# Patient Record
Sex: Female | Born: 1965 | Race: White | Hispanic: No | Marital: Married | Smoking: Never smoker
Health system: Southern US, Community
[De-identification: ages and names within clinical notes are randomized; demographics above are authoritative.]

## PROBLEM LIST (undated history)

## (undated) DIAGNOSIS — Z87442 Personal history of urinary calculi: Secondary | ICD-10-CM

## (undated) DIAGNOSIS — R51 Headache: Secondary | ICD-10-CM

## (undated) DIAGNOSIS — I10 Essential (primary) hypertension: Secondary | ICD-10-CM

## (undated) DIAGNOSIS — F419 Anxiety disorder, unspecified: Secondary | ICD-10-CM

## (undated) DIAGNOSIS — M797 Fibromyalgia: Secondary | ICD-10-CM

## (undated) DIAGNOSIS — R1011 Right upper quadrant pain: Secondary | ICD-10-CM

## (undated) DIAGNOSIS — F329 Major depressive disorder, single episode, unspecified: Secondary | ICD-10-CM

## (undated) DIAGNOSIS — N926 Irregular menstruation, unspecified: Secondary | ICD-10-CM

## (undated) DIAGNOSIS — E079 Disorder of thyroid, unspecified: Secondary | ICD-10-CM

## (undated) HISTORY — DX: Headache: R51

## (undated) HISTORY — DX: Major depressive disorder, single episode, unspecified: F32.9

## (undated) HISTORY — DX: Irregular menstruation, unspecified: N92.6

## (undated) HISTORY — DX: Fibromyalgia: M79.7

## (undated) HISTORY — DX: Personal history of urinary calculi: Z87.442

## (undated) HISTORY — DX: Right upper quadrant pain: R10.11

## (undated) HISTORY — DX: Anxiety disorder, unspecified: F41.9

## (undated) HISTORY — DX: Essential (primary) hypertension: I10

## (undated) HISTORY — DX: Disorder of thyroid, unspecified: E07.9

---
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
## 1997-09-10 ENCOUNTER — Ambulatory Visit: Admission: RE | Admit: 1997-09-10 | Discharge: 1997-09-10 | Payer: Self-pay | Admitting: Internal Medicine

## 2000-08-18 ENCOUNTER — Emergency Department (HOSPITAL_COMMUNITY): Admission: EM | Admit: 2000-08-18 | Discharge: 2000-08-19 | Payer: Self-pay | Admitting: Emergency Medicine

## 2000-08-18 ENCOUNTER — Encounter: Payer: Self-pay | Admitting: Emergency Medicine

## 2002-03-17 ENCOUNTER — Encounter: Admission: RE | Admit: 2002-03-17 | Discharge: 2002-03-17 | Payer: Self-pay | Admitting: Family Medicine

## 2002-03-17 ENCOUNTER — Encounter: Payer: Self-pay | Admitting: Family Medicine

## 2006-01-01 LAB — CONVERTED CEMR LAB: Pap Smear: ABNORMAL

## 2009-06-20 ENCOUNTER — Encounter: Admission: RE | Admit: 2009-06-20 | Discharge: 2009-06-20 | Payer: Self-pay | Admitting: Family Medicine

## 2009-09-28 ENCOUNTER — Ambulatory Visit: Payer: Self-pay | Admitting: Family Medicine

## 2009-09-28 DIAGNOSIS — N926 Irregular menstruation, unspecified: Secondary | ICD-10-CM | POA: Insufficient documentation

## 2009-09-28 DIAGNOSIS — R519 Headache, unspecified: Secondary | ICD-10-CM

## 2009-09-28 DIAGNOSIS — F3289 Other specified depressive episodes: Secondary | ICD-10-CM

## 2009-09-28 DIAGNOSIS — R51 Headache: Secondary | ICD-10-CM

## 2009-09-28 DIAGNOSIS — G8929 Other chronic pain: Secondary | ICD-10-CM

## 2009-09-28 DIAGNOSIS — I10 Essential (primary) hypertension: Secondary | ICD-10-CM

## 2009-09-28 DIAGNOSIS — Z8659 Personal history of other mental and behavioral disorders: Secondary | ICD-10-CM | POA: Insufficient documentation

## 2009-09-28 DIAGNOSIS — F329 Major depressive disorder, single episode, unspecified: Secondary | ICD-10-CM

## 2009-09-28 HISTORY — DX: Other specified depressive episodes: F32.89

## 2009-09-28 HISTORY — DX: Irregular menstruation, unspecified: N92.6

## 2009-09-28 HISTORY — DX: Major depressive disorder, single episode, unspecified: F32.9

## 2009-09-28 HISTORY — DX: Headache, unspecified: R51.9

## 2009-09-28 HISTORY — DX: Other chronic pain: G89.29

## 2009-09-28 HISTORY — DX: Essential (primary) hypertension: I10

## 2009-11-09 ENCOUNTER — Ambulatory Visit: Payer: Self-pay | Admitting: Family Medicine

## 2009-11-09 ENCOUNTER — Other Ambulatory Visit: Admission: RE | Admit: 2009-11-09 | Discharge: 2009-11-09 | Payer: Self-pay | Admitting: Family Medicine

## 2009-11-09 LAB — CONVERTED CEMR LAB
Bilirubin Urine: NEGATIVE
Blood in Urine, dipstick: NEGATIVE
Glucose, Urine, Semiquant: NEGATIVE
Ketones, urine, test strip: NEGATIVE
Nitrite: NEGATIVE
Protein, U semiquant: NEGATIVE
Specific Gravity, Urine: 1.02
Urobilinogen, UA: 0.2
WBC Urine, dipstick: NEGATIVE
pH: 7

## 2009-11-10 LAB — CONVERTED CEMR LAB
ALT: 25 units/L (ref 0–35)
AST: 24 units/L (ref 0–37)
Albumin: 4 g/dL (ref 3.5–5.2)
Alkaline Phosphatase: 81 units/L (ref 39–117)
BUN: 12 mg/dL (ref 6–23)
Basophils Absolute: 0 10*3/uL (ref 0.0–0.1)
Basophils Relative: 0.6 % (ref 0.0–3.0)
Bilirubin, Direct: 0.1 mg/dL (ref 0.0–0.3)
CO2: 27 meq/L (ref 19–32)
Calcium: 9.6 mg/dL (ref 8.4–10.5)
Chloride: 107 meq/L (ref 96–112)
Cholesterol: 200 mg/dL (ref 0–200)
Creatinine, Ser: 0.7 mg/dL (ref 0.4–1.2)
Eosinophils Absolute: 0.2 10*3/uL (ref 0.0–0.7)
Eosinophils Relative: 4 % (ref 0.0–5.0)
GFR calc non Af Amer: 91.63 mL/min (ref >60)
Glucose, Bld: 89 mg/dL (ref 70–99)
HCT: 41.9 % (ref 36.0–46.0)
HDL: 45.5 mg/dL (ref >39.00)
Hemoglobin: 14.3 g/dL (ref 12.0–15.0)
LDL Cholesterol: 136 mg/dL — ABNORMAL HIGH (ref 0–99)
Lymphocytes Relative: 14.6 % (ref 12.0–46.0)
Lymphs Abs: 0.9 10*3/uL (ref 0.7–4.0)
MCHC: 34.2 g/dL (ref 30.0–36.0)
MCV: 88.1 fL (ref 78.0–100.0)
Monocytes Absolute: 0.6 10*3/uL (ref 0.1–1.0)
Monocytes Relative: 9.7 % (ref 3.0–12.0)
Neutro Abs: 4.4 10*3/uL (ref 1.4–7.7)
Neutrophils Relative %: 71.1 % (ref 43.0–77.0)
Platelets: 236 10*3/uL (ref 150.0–400.0)
Potassium: 5.1 meq/L (ref 3.5–5.1)
RBC: 4.76 M/uL (ref 3.87–5.11)
RDW: 13.6 % (ref 11.5–14.6)
Sodium: 142 meq/L (ref 135–145)
TSH: 3.16 microintl units/mL (ref 0.35–5.50)
Total Bilirubin: 0.7 mg/dL (ref 0.3–1.2)
Total CHOL/HDL Ratio: 4
Total Protein: 6.7 g/dL (ref 6.0–8.3)
Triglycerides: 93 mg/dL (ref 0.0–149.0)
VLDL: 18.6 mg/dL (ref 0.0–40.0)
WBC: 6.1 10*3/uL (ref 4.5–10.5)

## 2009-11-16 LAB — CONVERTED CEMR LAB: Pap Smear: NEGATIVE

## 2010-01-22 ENCOUNTER — Encounter: Payer: Self-pay | Admitting: Internal Medicine

## 2010-01-25 ENCOUNTER — Encounter: Payer: Self-pay | Admitting: Family Medicine

## 2010-01-25 ENCOUNTER — Ambulatory Visit
Admission: RE | Admit: 2010-01-25 | Discharge: 2010-01-25 | Payer: Self-pay | Source: Home / Self Care | Attending: Family Medicine | Admitting: Family Medicine

## 2010-01-27 ENCOUNTER — Ambulatory Visit
Admission: RE | Admit: 2010-01-27 | Discharge: 2010-01-27 | Payer: Self-pay | Source: Home / Self Care | Attending: Family Medicine | Admitting: Family Medicine

## 2010-02-02 NOTE — Assessment & Plan Note (Signed)
Summary: BRAND NEW PT/TO EST/CJR/BP ISSUES/DEPRESSION/IRREGULAR MENSTR...   Vital Signs:  Patient profile:   45 year old female Menstrual status:  irregular LMP:     08/28/2009 Height:      63 inches Weight:      157 pounds BMI:     27.91 Temp:     98.6 degrees F oral Pulse rate:   80 / minute Pulse rhythm:   regular Resp:     12 per minute BP sitting:   160 / 110  (left arm) Cuff size:   regular  Vitals Entered By: Sid Falcon LPN (September 28, 2009 11:16 AM)  Nutrition Counseling: Patient's BMI is greater than 25 and therefore counseled on weight management options.  Serial Vital Signs/Assessments:  Time      Position  BP       Pulse  Resp  Temp     By                     160/115                        Evelena Peat MD  CC: New to establish Is Patient Diabetic? No LMP (date): 08/28/2009     Menstrual Status irregular Enter LMP: 08/28/2009 Last PAP Result abnormal   History of Present Illness: Patient is new to establish care.  Chronic medical problems include history of depression, migraine headaches, one kidney stone about 10 years ago, several years elevated blood pressure readings but never treated and questionable transient hypothyroidism several years ago. Previous C-section tonsillectomy.  Tramadol for intermittent back pain. Allergy to penicillin.  Depressive symptoms of decreased mood, low interest, low energy, and anxiousness.  No suicidal ideation.  Symptoms have progressed over several months.  Complains of some mild menstrual irregularity, mostly in terms of slightly longer and heavier bleed some months but still fairly regular.  Needs f/u pap.  Reported past hx of abnormal in past.  Family history significant for hypertension and fibromyalgia.   Patient recently remarried. 66 year old daughter lives with her ex-husband. She is nonsmoker. Rare alcohol use. 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, maintenance     Smoking Status: never  Caffeine-Diet-Exercise     Does Patient Exercise: no  Allergies (verified): 1)  Penicillin V Potassium (Penicillin V Potassium)  Past History:  Family History: Last updated: 09/28/2009 Family History of Arthritis Breast cancer, maternal grandmother Mother, hypertension, fibromylgia Both grandfathers heart attack  Social History: Last updated: 09/28/2009 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 Alcohol use-yes Regular exercise-no daughter from first marriage  Risk Factors: Exercise: no (09/28/2009)  Risk Factors: Smoking Status: never (09/28/2009)  Past Medical History:  Headache, migraines Hypertension Kidney stones Depression, hx of  Past Surgical History: Caesarean section 1997 Tonsillectomy 1973 PMH-FH-SH reviewed for relevance  Family History: Family History of Arthritis Breast cancer, maternal grandmother Mother, hypertension, fibromylgia Both grandfathers heart attack  Social History: 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    Physical Exam  General:  Well-developed,well-nourished,in no acute distress; alert,appropriate and cooperative throughout examination Head:  Normocephalic and atraumatic without obvious abnormalities. No apparent alopecia or balding. Ears:  External ear exam shows no significant lesions or deformities.  Otoscopic examination reveals clear canals, tympanic membranes are intact  bilaterally without bulging, retraction, inflammation or discharge. Hearing is grossly normal bilaterally. Mouth:  Oral mucosa and oropharynx without lesions or exudates.  Teeth in good repair. Neck:  No deformities, masses, or tenderness noted. Lungs:  Normal respiratory effort, chest expands symmetrically. Lungs are clear to auscultation, no crackles or wheezes. Heart:  Normal rate and regular rhythm. S1 and S2 normal without gallop, murmur, click, rub or other extra sounds. Extremities:  No clubbing, cyanosis, edema, or deformity noted with normal full range of motion of all joints.   Neurologic:  alert & oriented X3 and cranial nerves II-XII intact.   Psych:  normally interactive, good eye contact, not anxious appearing, and depressed affect.     Impression & Recommendations:  Problem # 1:  HYPERTENSION (ICD-401.9) Assessment New start medication.  Lifestyle factors discussed. Her updated medication list for this problem includes:    Lisinopril 10 Mg Tabs (Lisinopril) ..... One by mouth once daily  Problem # 2:  IRREGULAR MENSES (ICD-626.4)  Problem # 3:  DEPRESSION (ICD-311) Assessment: Deteriorated start sertraline and schedule f/u to reassess. Her updated medication list for this problem includes:    Sertraline Hcl 50 Mg Tabs (Sertraline hcl) ..... One by mouth once daily  Complete Medication List: 1)  Tramadol Hcl 50 Mg Tabs (Tramadol hcl) .[name].. 1-2 tabs by mouth every 6 hours as needed pain 2)  Sertraline Hcl 50 Mg Tabs (Sertraline hcl) .... One by mouth once daily 3)  Lisinopril 10 Mg Tabs (Lisinopril) .... One by mouth once daily  Patient Instructions: 1)  Schedule complete physical examination within the next month 2)  Check your  Blood Pressure regularly . If it is above:140/90   you should make an appointment. 3)  It is important that you exercise reguarly at least 20 minutes 5 times a week. If you develop chest pain, have severe difficulty breathing, or feel very  tired, stop exercising immediately and seek medical attention.  Prescriptions: TRAMADOL HCL 50 MG TABS (TRAMADOL HCL) 1-2 tabs by mouth every 6 hours as needed pain  #120 x 1   Entered and Authorized by:   Evelena Peat MD   Signed by:   Evelena Peat MD on 09/28/2009   Method used:   Electronically to        [facility] # [phone]* (retail)       [address]       [place], [place]  [address]       Ph: [phone]       Fax: [phone]   RxID:   [id number] LISINOPRIL 10 MG TABS (LISINOPRIL) one by mouth once daily  #30 x 3   Entered and Authorized by:   Evelena Peat MD   Signed by:   Evelena Peat MD on 09/28/2009   Method used:   Electronically to        [facility] # [phone]* (retail)       [address]       [place], [place]  [address]       Ph: [phone]       Fax: [phone]   RxID:   [id number] SERTRALINE HCL 50 MG TABS (SERTRALINE HCL) one by mouth once daily  #30 x 3   Entered and Authorized by:   Evelena Peat MD  Signed by:   Evelena Peat MD on 09/28/2009   Method used:   Electronically to        [facility] # [phone]* (retail)       [address]       [place], [place]  [address]       Ph: [phone]       Fax: [phone]   RxID:   [id number]   Preventive Care Screening  Last Tetanus Booster:    Date:  06/01/2009    Results:  Tdap   Pap Smear:    Date:  01/01/2006    Results:  abnormal

## 2010-02-02 NOTE — Assessment & Plan Note (Signed)
Summary: TB READ/cb  Nurse Visit   Allergies: 1)  Penicillin V Potassium (Penicillin V Potassium)  PPD Results    Date of reading: 01/27/2010    Results: < 5mm    Interpretation: negative

## 2010-02-02 NOTE — Assessment & Plan Note (Signed)
Summary: fup//ccm   Vital Signs:  Patient profile:   45 year old female Menstrual status:  irregular Weight:      155 pounds Temp:     98.0 degrees F oral BP sitting:   110 / 80  (left arm) Cuff size:   regular  Vitals Entered By: Sid Falcon LPN (January 25, 2010 11:10 AM)  History of Present Illness: Here for several items.  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 No history of communicable diseases. She recalls having PPD placed 2 years ago and negative. No known exposures.  Tetanus is up to date.  History of depression which is stable and needs refills of sertraline. She has been on this for some time and compliant.  Hypertension treated with lisinopril. No headaches or dizziness. Blood pressure is well-controlled.  History of migraine headaches. Uses tramadol as needed  New issue of right dorsal wrist lesion which she first noticed a few months ago. Nonpainful. Not growing. Slightly scaly and she tends to scratch the area frequently.  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    Physical Exam  General:  Well-developed,well-nourished,in no acute distress; alert,appropriate and cooperative throughout examination Ears:  External ear exam shows no significant lesions or deformities.  Otoscopic examination reveals clear canals, tympanic membranes are intact bilaterally without bulging, retraction, inflammation or discharge. Hearing is grossly normal bilaterally. Mouth:  Oral mucosa and oropharynx without lesions or exudates.  Teeth in good repair. Neck:  No deformities, masses, or tenderness noted. Lungs:  Normal respiratory effort, chest expands symmetrically. Lungs are clear to auscultation, no crackles or wheezes. Heart:  Normal rate and regular rhythm. S1 and S2 normal without gallop, murmur, click, rub or other extra sounds. Extremities:  right wrist dorsally reveals slightly scaly area which is about 3 x 4 mm. Underlying venous dilation which blanches with pressure. No masses palpated. No nodules. Cervical Nodes:  No lymphadenopathy noted Psych:  normally interactive, good eye contact, not anxious appearing, and not depressed appearing.     Impression & Recommendations:  Problem # 1:  HYPERTENSION (ICD-401.9)  Her updated medication list for this problem includes:    Lisinopril 10 Mg Tabs (Lisinopril) ..... One by mouth once daily  Problem # 2:  HEADACHE (ICD-784.0)  Her updated medication list for this problem includes:    Tramadol Hcl 50 Mg Tabs (Tramadol hcl) .[name]... 1-2 tabs by mouth every 6 hours as needed pain  Problem # 3:  DEPRESSION (ICD-311)  Her updated medication list for this problem includes:    Sertraline Hcl 50 Mg Tabs (Sertraline hcl) ..... One by mouth once daily  Problem # 4:  SKIN LESION (ICD-709.9) suspect benign.  Excoriated area with some blanching with pressure.  try OTC hydrocortisone cream and  observe.  Complete Medication List: 1)  Tramadol Hcl 50 Mg Tabs (Tramadol hcl) .[name].. 1-2 tabs by mouth every 6 hours as needed pain 2)  Sertraline Hcl 50 Mg Tabs (Sertraline hcl) .... One by mouth once daily 3)  Lisinopril 10 Mg Tabs (Lisinopril) .... One by mouth once daily  Other Orders: TB Skin Test [id number]) Admin 1st Vaccine (46962)  Patient Instructions: 1)  Return in 2 days for PPD read Prescriptions: LISINOPRIL 10 MG TABS (LISINOPRIL) one by mouth once daily  #90 x 3   Entered and Authorized by:   Evelena Peat MD   Signed by:   Evelena Peat MD on 01/25/2010   Method used:   Print then Give to Patient   RxID:   [id number] SERTRALINE HCL 50 MG TABS (SERTRALINE HCL) one by mouth once daily  #90 x 3   Entered and Authorized by:   Evelena Peat MD   Signed by:   Evelena Peat MD on 01/25/2010   Method used:   Print then Give to Patient   RxID:   [id number] TRAMADOL HCL 50 MG TABS (TRAMADOL HCL) 1-2 tabs by mouth every 6 hours as needed pain  #120 x 1   Entered and Authorized by:   Evelena Peat MD   Signed by:   Evelena Peat MD on 01/25/2010   Method used:   Print then Give to Patient   RxID:   [id number]    Orders Added: 1)  TB Skin Test [66063] 2)  Admin 1st Vaccine [90471] 3)  Est. Patient Level IV [01601]   Immunizations Administered:  PPD Skin Test:    Vaccine Type: PPD    Site: left forearm    Mfr: Sanofi Pasteur    Dose: 0.1 ml    Route: ID    Given by: Sid Falcon LPN    Exp. Date: 11/03/2010    Lot #: [id number]   Immunizations Administered:  PPD Skin Test:    Vaccine Type: PPD    Site: left forearm    Mfr: Sanofi Pasteur    Dose: 0.1 ml    Route: ID    Given by: Sid Falcon LPN    Exp. Date: 11/03/2010    Lot #: [id number]

## 2010-02-02 NOTE — Assessment & Plan Note (Signed)
Summary: cpx/pap/pt will come in fasting/njr   Vital Signs:  Patient profile:   45 year old female Menstrual status:  irregular Height:      63.25 inches Weight:      153 pounds Temp:     98.2 degrees F oral Pulse rate:   84 / minute Pulse rhythm:   regular Resp:     12 per minute BP sitting:   120 / 78  (left arm) Cuff size:   regular  Vitals Entered By: Sid Falcon LPN (November 09, 2009 9:05 AM)  History of Present Illness: Patient for complete physical examination.  Since last visit we started lisinopril for hypertension and she is making some excellent dietary changes with reduction of sodium. She feels much better overall. Blood pressures been well controlled.  She has lost a few pounds.  Mood disorder greatly improved with sertraline. Had some mild nausea first few days that is totally resolved. She is handling work stress much better.  Depressed mood is greatly improved.  Also sleeping better.  last tetanus earlier this year. No history of Baseline mammogram. Last Pap smear 2008. Reported hx of abnormal pap smears.  Clinical Review Panels:  Prevention   Last Pap Smear:  abnormal (01/01/2006)  Immunizations   Last Tetanus Booster:  Tdap (06/01/2009)   Allergies: 1)  Penicillin V Potassium (Penicillin V Potassium)  Past History:  Past Medical History: Last updated: 09/28/2009  Headache, migraines Hypertension Kidney stones Depression, hx of  Past Surgical History: Last updated: 09/28/2009 Caesarean section 1997 Tonsillectomy 1973  Family History: Last updated: 09/28/2009 Family History of Arthritis Breast cancer, maternal grandmother Mother, hypertension, fibromylgia Both grandfathers heart attack  Social History: Last updated: 09/28/2009 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 Alcohol use-yes Regular exercise-no daughter from first marriage  Risk Factors: Exercise: no (09/28/2009)  Risk Factors: Smoking Status:  never (09/28/2009)  Review of Systems  The patient denies anorexia, fever, weight gain, vision loss, decreased hearing, hoarseness, chest pain, syncope, dyspnea on exertion, peripheral edema, prolonged cough, headaches, hemoptysis, abdominal pain, melena, hematochezia, severe indigestion/heartburn, hematuria, incontinence, genital sores, muscle weakness, suspicious skin lesions, transient blindness, difficulty walking, depression, unusual weight change, abnormal bleeding, enlarged lymph nodes, and breast masses.    Physical Exam  General:  Well-developed,well-nourished,in no acute distress; alert,appropriate and cooperative throughout examination Head:  Normocephalic and atraumatic without obvious abnormalities. No apparent alopecia or balding. Eyes:  No corneal or conjunctival inflammation noted. EOMI. Perrla. Funduscopic exam benign, without hemorrhages, exudates or papilledema. Vision grossly normal. Ears:  External ear exam shows no significant lesions or deformities.  Otoscopic examination reveals clear canals, tympanic membranes are intact bilaterally without bulging, retraction, inflammation or discharge. Hearing is grossly normal bilaterally. Mouth:  Oral mucosa and oropharynx without lesions or exudates.  Teeth in good repair. Neck:  No deformities, masses, or tenderness noted. Breasts:  No mass, nodules, thickening, tenderness, bulging, retraction, inflamation, nipple discharge or skin changes noted.   Lungs:  Normal respiratory effort, chest expands symmetrically. Lungs are clear to auscultation, no crackles or wheezes. Heart:  Normal rate and regular rhythm. S1 and S2 normal without gallop, murmur, click, rub or other extra sounds. Abdomen:  Bowel sounds positive,abdomen soft and non-tender without masses, organomegaly or hernias noted. Genitalia:  Normal introitus for age, no external lesions, no vaginal discharge, mucosa pink and moist, no vaginal or cervical lesions, no vaginal  atrophy, no friaility or hemorrhage, normal uterus size and position, no adnexal masses or tenderness Msk:  No deformity or scoliosis noted of thoracic or lumbar spine.   Extremities:  No clubbing, cyanosis, edema, or deformity noted with normal full range of motion of all joints.   Neurologic:  No cranial nerve deficits noted. Station and gait are normal. Plantar reflexes are down-going bilaterally. DTRs are symmetrical throughout. Sensory, motor and coordinative functions appear intact. Skin:  Intact without suspicious lesions or rashes Cervical Nodes:  No lymphadenopathy noted Psych:  normally interactive, good eye contact, not anxious appearing, and not depressed appearing.     Impression & Recommendations:  Problem # 1:  Preventive Health Care (ICD-V70.0) pap smear obtained.  Fasting labs ordered. Cont to work on weight loss. Rec baseline mammogram and pt given info on how and where to schedule.  Problem # 2:  HYPERTENSION (ICD-401.9) Assessment: Improved  Her updated medication list for this problem includes:    Lisinopril 10 Mg Tabs (Lisinopril) ..... One by mouth once daily  Problem # 3:  DEPRESSION (ICD-311) Assessment: Improved  Her updated medication list for this problem includes:    Sertraline Hcl 50 Mg Tabs (Sertraline hcl) ..... One by mouth once daily  Complete Medication List: 1)  Tramadol Hcl 50 Mg Tabs (Tramadol hcl) .[name].. 1-2 tabs by mouth every 6 hours as needed pain 2)  Sertraline Hcl 50 Mg Tabs (Sertraline hcl) .... One by mouth once daily 3)  Lisinopril 10 Mg Tabs (Lisinopril) .... One by mouth once daily  Other Orders: Pap Smear, Thin Prep ( Collection of) (O7564) Venipuncture (33295) Specimen Handling (18841) TLB-Lipid Panel (80061-LIPID) TLB-BMP (Basic Metabolic Panel-BMET) (80048-METABOL) TLB-CBC Platelet - w/Differential (85025-CBCD) TLB-Hepatic/Liver Function Pnl (80076-HEPATIC) TLB-TSH (Thyroid Stimulating Hormone) (84443-TSH) UA Dipstick w/o  Micro (automated)  (81003)  Patient Instructions: 1)  Please schedule a follow-up appointment in 6 months .  2)  It is important that you exercise reguarly at least 20 minutes 5 times a week. If you develop chest pain, have severe difficulty breathing, or feel very tired, stop exercising immediately and seek medical attention.  3)  You need to lose weight. Consider a lower calorie diet and regular exercise.    Orders Added: 1)  Pap Smear, Thin Prep ( Collection of) [Q0091] 2)  Venipuncture [66063] 3)  Specimen Handling [99000] 4)  TLB-Lipid Panel [80061-LIPID] 5)  TLB-BMP (Basic Metabolic Panel-BMET) [80048-METABOL] 6)  TLB-CBC Platelet - w/Differential [85025-CBCD] 7)  TLB-Hepatic/Liver Function Pnl [80076-HEPATIC] 8)  TLB-TSH (Thyroid Stimulating Hormone) [84443-TSH] 9)  Est. Patient 40-64 years [99396] 10)  UA Dipstick w/o Micro (automated)  [81003]    Laboratory Results   Urine Tests  Date/Time Recieved: November 09, 2009 12:42 PM  Date/Time Reported: November 09, 2009 12:42 PM   Routine Urinalysis   Color: yellow Appearance: Clear Glucose: negative   (Normal Range: Negative) Bilirubin: negative   (Normal Range: Negative) Ketone: negative   (Normal Range: Negative) Spec. Gravity: 1.020   (Normal Range: 1.003-1.035) Blood: negative   (Normal Range: Negative) pH: 7.0   (Normal Range: 5.0-8.0) Protein: negative   (Normal Range: Negative) Urobilinogen: 0.2   (Normal Range: 0-1) Nitrite: negative   (Normal Range: Negative) Leukocyte Esterace: negative   (Normal Range: Negative)    Comments: Wynona Canes, CMA  November 09, 2009 12:42 PM

## 2010-02-08 NOTE — Letter (Signed)
Summary: Health Examination Certificate  Health Examination Certificate   Imported By: Maryln Gottron 02/01/2010 15:31:27  _____________________________________________________________________  External Attachment:    Type:   Image     Comment:   External Document

## 2010-05-22 ENCOUNTER — Encounter: Payer: Self-pay | Admitting: Family Medicine

## 2010-05-22 ENCOUNTER — Ambulatory Visit (INDEPENDENT_AMBULATORY_CARE_PROVIDER_SITE_OTHER): Payer: 59 | Admitting: Family Medicine

## 2010-05-22 VITALS — BP 100/80 | Temp 98.7°F | Wt 160.0 lb

## 2010-05-22 DIAGNOSIS — R1011 Right upper quadrant pain: Secondary | ICD-10-CM

## 2010-05-22 NOTE — Progress Notes (Signed)
  Subjective:    Patient ID: Natasha Fitzgerald, female    DOB: 07/31/1965, 45 y.o.   MRN: [id number]  HPI Patient seen with abdominal pain intermittently since last Friday. First episode occurred Friday night. Mid epigastric and right upper quadrant. Achy quality. Moderate severity. Associated nausea and vomiting. No hematemesis. No fever. Pain radiated to the right mid and upper back region. Patient then had a similar episode early Saturday morning and then again around midnight last night. No recent melena. No recent nonsteroidal use. No history of gallstones. Denies any fever or chills. No history of pancreatitis. Her current medications are reviewed. No recent medication changes.  Past Medical History  Diagnosis Date  . DEPRESSION 09/28/2009  . HYPERTENSION 09/28/2009  . IRREGULAR MENSES 09/28/2009  . Headache 09/28/2009   Past Surgical History  Procedure Date  . Cesarean section 1997  . Tonsillectomy 1973    reports that she has never smoked. She does not have any smokeless tobacco history on file. Her alcohol and drug histories not on file. family history includes Cancer in her maternal grandmother; Heart disease in her maternal grandfather and paternal grandfather; and Hypertension in her mother. Allergies  Allergen Reactions  . Penicillins     REACTION: hives      Review of Systems  Constitutional: Positive for appetite change. Negative for fever, chills and unexpected weight change.  Respiratory: Negative for cough, shortness of breath and wheezing.   Cardiovascular: Negative for chest pain, palpitations and leg swelling.  Gastrointestinal: Positive for nausea, vomiting and abdominal pain. Negative for diarrhea, constipation, blood in stool and rectal pain.  Genitourinary: Negative for dysuria.  Skin: Negative for rash.       Objective:   Physical Exam  Constitutional: She appears well-developed and well-nourished.  HENT:  Right Ear: External ear normal.  Left Ear:  External ear normal.  Mouth/Throat: Oropharynx is clear and moist.  Neck: Neck supple.  Cardiovascular: Normal rate, regular rhythm and normal heart sounds.   No murmur heard. Pulmonary/Chest: Effort normal and breath sounds normal. No respiratory distress. She has no wheezes.  Abdominal: Soft. Bowel sounds are normal. She exhibits no distension and no mass. There is tenderness. There is no rebound and no guarding.       Moderately tender midepigastric area and right upper quadrant. No guarding. No hepatomegaly noted  Musculoskeletal: She exhibits no edema.  Lymphadenopathy:    She has no cervical adenopathy.          Assessment & Plan:  Abdominal pain midepigastric and right upper quadrant. Symptoms are intermittent and worse at night associated with nausea and vomiting and radiation to back. Rule out symptomatic gallstones. Abdominal ultrasound. Check CBC, hepatic panel, and lipase. Avoid fatty foods until further evaluated.  Follow up immediately for any fever or progressive pain.

## 2010-05-22 NOTE — Patient Instructions (Signed)
Follow up immediately for any fever or worsening pain. Bland, low fat diet until further evaluated.

## 2010-05-23 ENCOUNTER — Ambulatory Visit
Admission: RE | Admit: 2010-05-23 | Discharge: 2010-05-23 | Disposition: A | Discharge status: Home / Self Care | Payer: 59 | Source: Ambulatory Visit | Attending: Family Medicine | Admitting: Family Medicine

## 2010-05-23 DIAGNOSIS — R1011 Right upper quadrant pain: Secondary | ICD-10-CM

## 2010-05-23 LAB — CBC WITH DIFFERENTIAL/PLATELET
Basophils Absolute: 0.1 10*3/uL (ref 0.0–0.1)
Eosinophils Relative: 2.2 % (ref 0.0–5.0)
Lymphocytes Relative: 16.9 % (ref 12.0–46.0)
Lymphs Abs: 1.2 10*3/uL (ref 0.7–4.0)
Monocytes Relative: 7.1 % (ref 3.0–12.0)
Neutrophils Relative %: 72.6 % (ref 43.0–77.0)
Platelets: 283 10*3/uL (ref 150.0–400.0)
RDW: 14.1 % (ref 11.5–14.6)
WBC: 7.2 10*3/uL (ref 4.5–10.5)

## 2010-05-23 LAB — HEPATIC FUNCTION PANEL
AST: 243 U/L — ABNORMAL HIGH (ref 0–37)
Alkaline Phosphatase: 171 U/L — ABNORMAL HIGH (ref 39–117)
Bilirubin, Direct: 0.2 mg/dL (ref 0.0–0.3)
Total Bilirubin: 0.8 mg/dL (ref 0.3–1.2)

## 2010-05-23 LAB — LIPASE: Lipase: 41 U/L (ref 11.0–59.0)

## 2010-05-23 NOTE — Progress Notes (Signed)
Quick Note:  I spoke with pt, she has not had painful episodes again. She will monitor her sx and call Wed to schedule F/U if her sx remain under control. She was also told to contact us if sx worsen ______

## 2010-05-23 NOTE — Progress Notes (Signed)
Quick Note:  Pt informed on personally identified VM ______ 

## 2010-05-25 ENCOUNTER — Encounter: Payer: Self-pay | Admitting: Family Medicine

## 2010-05-25 ENCOUNTER — Ambulatory Visit (INDEPENDENT_AMBULATORY_CARE_PROVIDER_SITE_OTHER): Payer: 59 | Admitting: Family Medicine

## 2010-05-25 VITALS — BP 122/82 | Temp 98.1°F | Wt 163.0 lb

## 2010-05-25 DIAGNOSIS — R7401 Elevation of levels of liver transaminase levels: Secondary | ICD-10-CM

## 2010-05-25 DIAGNOSIS — R1011 Right upper quadrant pain: Secondary | ICD-10-CM

## 2010-05-25 MED ORDER — OMEPRAZOLE MAGNESIUM 20 MG PO TBEC: 20.0000 mg | DELAYED_RELEASE_TABLET | Freq: Every day | ORAL | Status: DC

## 2010-05-25 NOTE — Progress Notes (Signed)
  Subjective:    Patient ID: Natasha Fitzgerald, female    DOB: 03-Sep-1965, 45 y.o.   MRN: [id number]  HPI Patient seen for followup.  Refer to prior note. Presented with relatively acute midepigastric and right upper quadrant pain with some associated nausea and vomiting. Pain is episodic. We suspected symptomatic gallstones. Ultrasound revealed no gallstones and no gallbladder wall thickening. Basically unremarkable ultrasound. Labs revealed normal white count. Elevated alkaline phosphatase, AST, and ALT. Symptoms are improved right now. She has still some mild pain midepigastric. Nausea and vomiting has essentially resolved. Eating foods okay but avoiding high fat content. Fluid intake is good. No stool changes. No fever or chills.   Review of Systems  Constitutional: Negative for fever and chills.  Respiratory: Negative for cough and shortness of breath.   Cardiovascular: Negative for chest pain.  Gastrointestinal: Negative for nausea, vomiting, diarrhea, constipation, blood in stool and abdominal distention.  Genitourinary: Negative for dysuria.       Objective:   Physical Exam  Constitutional: She appears well-developed and well-nourished.  Cardiovascular: Normal rate, regular rhythm and normal heart sounds.   Pulmonary/Chest: Effort normal and breath sounds normal. No respiratory distress. She has no wheezes. She has no rales. She exhibits no tenderness.  Abdominal: Soft. Bowel sounds are normal. She exhibits no distension and no mass. There is no rebound and no guarding.       Only very minimal midepigastric tenderness. Right upper quadrant tenderness from couple days ago is essentially resolved. Overall much less tender          Assessment & Plan:  Abdominal pain, nausea and vomiting, elevated liver transaminases. Suspect patient may have passed a small gallstone. She is symptomatically improved. Repeat liver enzymes today. If symptoms recur GI referral for consideration of EGD and  possible ERCP

## 2010-05-25 NOTE — Patient Instructions (Signed)
Follow up promptly for any fever or worsening abdominal pain 

## 2010-05-26 ENCOUNTER — Telehealth: Payer: Self-pay | Admitting: *Deleted

## 2010-05-26 DIAGNOSIS — R1011 Right upper quadrant pain: Secondary | ICD-10-CM

## 2010-05-26 LAB — HEPATIC FUNCTION PANEL
AST: 28 U/L (ref 0–37)
Alkaline Phosphatase: 129 U/L — ABNORMAL HIGH (ref 39–117)
Total Bilirubin: 0.3 mg/dL (ref 0.3–1.2)

## 2010-05-26 NOTE — Telephone Encounter (Addendum)
Pt is requesting referral to GI, and states Dr. Caryl Never advised her to call back if she still had pain and wanted to see GI.  Referral sent to [facility].

## 2010-05-30 ENCOUNTER — Telehealth: Payer: Self-pay | Admitting: *Deleted

## 2010-05-30 NOTE — Telephone Encounter (Signed)
Opened in error, already taken care of

## 2010-05-30 NOTE — Telephone Encounter (Signed)
Agree with referral.  Her liver transaminases have improved but refer with ongoing symptoms.

## 2010-05-30 NOTE — Progress Notes (Signed)
Quick Note:  Pt informed and she is reporting another 5 hour episode last Thursday night. She also wanted recorded she has not had her menstural cycle for 45 days, husband has had vasectomy, just wanted Dr Caryl Never to be made aware and documented for the record ______

## 2010-05-31 ENCOUNTER — Ambulatory Visit (INDEPENDENT_AMBULATORY_CARE_PROVIDER_SITE_OTHER): Payer: 59 | Admitting: Gastroenterology

## 2010-05-31 ENCOUNTER — Encounter: Payer: Self-pay | Admitting: Gastroenterology

## 2010-05-31 ENCOUNTER — Other Ambulatory Visit (INDEPENDENT_AMBULATORY_CARE_PROVIDER_SITE_OTHER): Payer: 59

## 2010-05-31 DIAGNOSIS — R109 Unspecified abdominal pain: Secondary | ICD-10-CM

## 2010-05-31 DIAGNOSIS — R7401 Elevation of levels of liver transaminase levels: Secondary | ICD-10-CM

## 2010-05-31 DIAGNOSIS — R748 Abnormal levels of other serum enzymes: Secondary | ICD-10-CM

## 2010-05-31 LAB — HEPATIC FUNCTION PANEL
Albumin: 4 g/dL (ref 3.5–5.2)
Total Protein: 7 g/dL (ref 6.0–8.3)

## 2010-05-31 LAB — BASIC METABOLIC PANEL
BUN: 12 mg/dL (ref 6–23)
CO2: 28 mEq/L (ref 19–32)
Calcium: 9.4 mg/dL (ref 8.4–10.5)
Creatinine, Ser: 0.7 mg/dL (ref 0.4–1.2)
GFR: 97.55 mL/min (ref >60.00)
Glucose, Bld: 73 mg/dL (ref 70–99)
Sodium: 137 mEq/L (ref 135–145)

## 2010-05-31 LAB — CBC WITH DIFFERENTIAL/PLATELET
Eosinophils Relative: 2.4 % (ref 0.0–5.0)
HCT: 40.8 % (ref 36.0–46.0)
Hemoglobin: 14 g/dL (ref 12.0–15.0)
Lymphs Abs: 1.4 10*3/uL (ref 0.7–4.0)
MCV: 88.5 fl (ref 78.0–100.0)
Monocytes Absolute: 0.6 10*3/uL (ref 0.1–1.0)
Monocytes Relative: 7.7 % (ref 3.0–12.0)
Neutro Abs: 5.4 10*3/uL (ref 1.4–7.7)
Platelets: 245 10*3/uL (ref 150.0–400.0)
WBC: 7.7 10*3/uL (ref 4.5–10.5)

## 2010-05-31 NOTE — Progress Notes (Signed)
HPI: This is a  very pleasant 45 year old woman who has been having several discreet attacks of epigastric to right upper quadrant pain for the past 2 weeks.  First episode of pain was bout 2 weeks ago, rug radiates to back.  Starts dull, then severe.  Sitting in hot tub helps.  Can last up to 5 hours.  Eating does not reliably bring it on.   + nausea associated.  Mild vomiting as well.    These episodes have been gradually worsening   Recent abd [place] was normal (no stones, no GB pathology, normal CBD) Recent LFTs show aphos 1-200, ast and alt both 250-300.  Bilirubin normal, CBC normal.    Repeat liver tests 3 days later showed her alkaline phosphatase and transaminases were all significantly improved.  She takes ibuprofen for fibromyalgia pains, usually will take 800-1000mg  ("at least") per day.  Started PPI last week for these pains.  She does have mild pyrosis symtoms, seems worse lately.    Never had hepatitis or jaundice.  No new medicines.  Review of systems: Pertinent positive and negative review of systems were noted in the above HPI section.  All other review of systems was otherwise negative.   Past Medical History, Past Surgical History, Family History, Social History, Current Medications, Allergies were all reviewed with the patient via [facility] electronic medical record system.   Physical Exam: BP 128/68  Pulse 76  Ht 5\' 3"  (1.6 m)  Wt 161 lb (73.029 kg)  BMI 28.52 kg/m2  LMP 04/18/2010 Constitutional: generally well-appearing Psychiatric: alert and oriented x3 Eyes: extraocular movements intact Mouth: oral pharynx moist, no lesions Neck: supple no lymphadenopathy Cardiovascular: heart regular rate and rhythm Lungs: clear to auscultation bilaterally Abdomen: soft, nontender, nondistended, no obvious ascites, no peritoneal signs, normal bowel sounds Extremities: no lower extremity edema bilaterally Skin: no lesions on visible extremities    Assessment and  plan: 45 y.o. female with  intermittent epigastric, right upper quadrant discomforts  I suspect she is having gallbladder pathology. Her liver tests became quite elevated during acute pain and then dropped almost back to normal. Her ultrasound is normal but I suspect she has chronic cholecystitis, perhaps biliary dyskinesia. We will set her up with a HIDA scan and we'll also repeat liver tests. Given that her liver tests had nearly normalized already I think it is very unlikely she has a viral, hepatitis type problem. She does take a lot of NSAIDs and peptic ulcer disease can present somewhat similarly however that would not explain her elevated liver tests. For now she'll try to cut back on her ibuprofen as best she can and will continue once daily proton pump inhibitor. If the HIDA scan clearly shows biliary dyskinesia, pathology then we will set her up with Gen. surgical vibration.

## 2010-05-31 NOTE — Patient Instructions (Addendum)
You will have labs checked today in the basement lab.  Please head down after you check out with the front desk  (cbc, lfts, bmet). HIDA scan of GB to estimate gallbladder function (with CCK).  [facility] 06/20/10 8 am please arrive at 745 am Nothing to eat or drink after midnight.  Do not take any pain medications for at least 6 hours prior to the procedure. Try to cut back on NSAIDs (ibuprofen) as best that you can in case your symptoms are from an ulcer.  Tyelenol is safe for your stomach. Stay on prilosec, 20-30 min before BF meal is best. A copy of this information will be made available to Dr. Caryl Never.

## 2010-06-07 ENCOUNTER — Ambulatory Visit (INDEPENDENT_AMBULATORY_CARE_PROVIDER_SITE_OTHER): Payer: 59 | Admitting: Family Medicine

## 2010-06-07 ENCOUNTER — Encounter: Payer: Self-pay | Admitting: Family Medicine

## 2010-06-07 VITALS — BP 120/80 | Temp 98.5°F | Wt 165.0 lb

## 2010-06-07 DIAGNOSIS — F329 Major depressive disorder, single episode, unspecified: Secondary | ICD-10-CM

## 2010-06-07 DIAGNOSIS — R7401 Elevation of levels of liver transaminase levels: Secondary | ICD-10-CM

## 2010-06-07 DIAGNOSIS — R300 Dysuria: Secondary | ICD-10-CM

## 2010-06-07 LAB — POCT URINALYSIS DIPSTICK
Bilirubin, UA: NEGATIVE
Blood, UA: NEGATIVE
Glucose, UA: NEGATIVE
Leukocytes, UA: NEGATIVE
Nitrite, UA: NEGATIVE
Urobilinogen, UA: 0.2

## 2010-06-07 MED ORDER — FLUCONAZOLE 150 MG PO TABS: 150.0000 mg | ORAL_TABLET | Freq: Once | ORAL | Status: AC

## 2010-06-07 NOTE — Patient Instructions (Signed)
Increase sertraline to 100 mg daily. 

## 2010-06-07 NOTE — Progress Notes (Signed)
  Subjective:    Patient ID: Natasha Fitzgerald, female    DOB: 05-Feb-1965, 45 y.o.   MRN: [id number]  HPI Patient seen today for the following issues  Two-week history of severe frequency and burning with urination. No fever or chills. She has had some whitish discharge and occasional vaginal itching. She has not tried anything topically. Took some Azo-Standard and symptoms are actually slightly better than when she initially had them. Last menstrual period was April 17. Husband had vasectomy. Home pregnancy test earlier this week negative.  History of depression and anxiety. Anxiety improved on sertraline but still has some depressed mood at 50 mg daily. Still has some sleep disturbance. Low motivation.  No suicidal thoughts.  Recent abdominal pain and right upper quadrant pain. Ultrasound no gallstones. Elevated alkaline phosphatase and liver transaminases. They continue to trend downward. Patient has been seen by gastroenterologist. Planned gallbladder nuclear study.   Review of Systems  Constitutional: Negative for chills and fatigue.  Gastrointestinal: Negative for abdominal pain.  Genitourinary: Negative for dysuria, frequency and flank pain.  Neurological: Negative for dizziness and syncope.  Hematological: Negative for adenopathy. Does not bruise/bleed easily.  Psychiatric/Behavioral: Positive for dysphoric mood. Negative for suicidal ideas. The patient is not nervous/anxious.        Objective:   Physical Exam  Constitutional: She is oriented to person, place, and time. She appears well-developed and well-nourished.  HENT:  Right Ear: External ear normal.  Left Ear: External ear normal.  Mouth/Throat: Oropharynx is clear and moist.  Neck: No thyromegaly present.  Cardiovascular: Normal rate, regular rhythm and normal heart sounds.   Pulmonary/Chest: Effort normal and breath sounds normal. No respiratory distress. She has no wheezes. She has no rales.  Musculoskeletal: She  exhibits no edema.  Lymphadenopathy:    She has no cervical adenopathy.  Neurological: She is alert and oriented to person, place, and time.  Psychiatric: She has a normal mood and affect.          Assessment & Plan:  #1 dysuria. No evidence of UTI on dipstick. Question yeast vaginitis. Diflucan 50 mg 1 dose. Touch base if no improvement #2 history of depression. Titrate sertraline 100 mg daily and touch base 2 weeks if no improvement. Consider low-dose Wellbutrin if no improvement #3 recent right upper quadrant abdominal pain with elevated liver transaminases-improving. Plan gallbladder nuclear scan in a couple weeks to further evaluate. No severe symptoms over the past week

## 2010-06-20 ENCOUNTER — Encounter (HOSPITAL_COMMUNITY)
Admission: RE | Admit: 2010-06-20 | Discharge: 2010-06-20 | Disposition: A | Discharge status: Home / Self Care | Payer: 59 | Source: Ambulatory Visit | Attending: Gastroenterology | Admitting: Gastroenterology

## 2010-06-20 DIAGNOSIS — R109 Unspecified abdominal pain: Secondary | ICD-10-CM | POA: Insufficient documentation

## 2010-06-20 MED ORDER — TECHNETIUM TC 99M MEBROFENIN IV KIT
5.4000 | PACK | Freq: Once | INTRAVENOUS | Status: AC | PRN
  Administered 2010-06-20: 5 via INTRAVENOUS

## 2010-06-20 MED ORDER — SINCALIDE 5 MCG IJ SOLR: 0.0200 ug/kg | Freq: Once | INTRAMUSCULAR | Status: DC

## 2010-06-22 ENCOUNTER — Other Ambulatory Visit: Payer: Self-pay | Admitting: Gastroenterology

## 2010-06-22 DIAGNOSIS — R1011 Right upper quadrant pain: Secondary | ICD-10-CM

## 2010-07-04 ENCOUNTER — Encounter (INDEPENDENT_AMBULATORY_CARE_PROVIDER_SITE_OTHER): Payer: Self-pay | Admitting: General Surgery

## 2010-07-04 ENCOUNTER — Ambulatory Visit (INDEPENDENT_AMBULATORY_CARE_PROVIDER_SITE_OTHER): Payer: 59 | Admitting: General Surgery

## 2010-07-04 VITALS — BP 132/94 | HR 88 | Temp 97.3°F | Ht 63.0 in | Wt 164.2 lb

## 2010-07-04 DIAGNOSIS — K828 Other specified diseases of gallbladder: Secondary | ICD-10-CM

## 2010-07-04 MED ORDER — HYDROCODONE-ACETAMINOPHEN 5-500 MG PO TABS: ORAL_TABLET | ORAL | Status: DC

## 2010-07-04 NOTE — Progress Notes (Signed)
Subjective:     Patient ID: Natasha Fitzgerald, female   DOB: [birth year], 45 y.o.   MRN: [id number]    BP 132/94  Pulse 88  Temp 97.3 F (36.3 C)  Ht 5\' 3"  (1.6 m)  Wt 164 lb 3.2 oz (74.481 kg)  BMI 29.09 kg/m2    HPI   Review of Systems  Constitutional: Positive for fatigue and unexpected weight change.  HENT: Positive for hearing loss, ear pain and sore throat.   Respiratory: Positive for cough.   Gastrointestinal: Positive for nausea, abdominal pain and abdominal distention.  Genitourinary:       History of kidney stone   Musculoskeletal: Positive for arthralgias.  Skin: Negative.   Neurological: Positive for headaches.  Hematological: Negative.   Psychiatric/Behavioral: Negative.        Objective:   Physical Exam  Constitutional: She is oriented to person, place, and time. She appears well-developed and well-nourished. No distress.  HENT:  Head: Normocephalic and atraumatic.  Mouth/Throat: No oropharyngeal exudate.  Eyes: Conjunctivae are normal. Pupils are equal, round, and reactive to light. No scleral icterus.  Neck: Normal range of motion. Neck supple. No thyromegaly present.  Cardiovascular: Normal rate, regular rhythm, normal heart sounds and intact distal pulses.  Exam reveals no gallop and no friction rub.   No murmur heard. Pulmonary/Chest: Effort normal and breath sounds normal. No respiratory distress. She has no wheezes. She has no rales. She exhibits no tenderness.  Abdominal: Soft. Bowel sounds are normal. She exhibits distension. She exhibits no mass. There is tenderness (ruq). There is no rebound and no guarding.  Musculoskeletal: Normal range of motion.  Lymphadenopathy:    She has no cervical adenopathy.  Neurological: She is alert and oriented to person, place, and time. She has normal reflexes. Coordination normal.  Skin: Skin is warm and dry. No rash noted. She is not diaphoretic. No erythema. No pallor.  Psychiatric: She has a normal mood and  affect. Her behavior is normal. Judgment and thought content normal.

## 2010-07-04 NOTE — Patient Instructions (Signed)

## 2010-07-04 NOTE — Assessment & Plan Note (Signed)
For OR  The surgical procedure was described to the patient.  I discussed the incision type and location, the location of the gallbladder, the anatomy of the bile ducts and arteries, and the typical progression of surgery.  I discussed the possibility of converting to an open operation.  I advised of the risks of bleeding, infection, damage to other structures, bile leak, need for other procedures or surgeries, and post op diarrhea/constipation.  The patient was advised against taking blood thinners the week before surgery.

## 2010-08-10 ENCOUNTER — Ambulatory Visit (INDEPENDENT_AMBULATORY_CARE_PROVIDER_SITE_OTHER): Payer: 59 | Admitting: Family Medicine

## 2010-08-10 ENCOUNTER — Encounter: Payer: Self-pay | Admitting: Family Medicine

## 2010-08-10 VITALS — BP 100/80 | Temp 99.0°F | Wt 166.0 lb

## 2010-08-10 DIAGNOSIS — IMO0001 Reserved for inherently not codable concepts without codable children: Secondary | ICD-10-CM

## 2010-08-10 DIAGNOSIS — M791 Myalgia, unspecified site: Secondary | ICD-10-CM

## 2010-08-10 MED ORDER — CYCLOBENZAPRINE HCL 5 MG PO TABS: ORAL_TABLET | ORAL | Status: DC

## 2010-08-10 MED ORDER — SERTRALINE HCL 100 MG PO TABS: 100.0000 mg | ORAL_TABLET | Freq: Every day | ORAL | Status: DC

## 2010-08-10 NOTE — Patient Instructions (Signed)
Try to establish regular exercise.

## 2010-08-10 NOTE — Progress Notes (Signed)
  Subjective:    Patient ID: Natasha Fitzgerald, female    DOB: 06-Aug-1965, 45 y.o.   MRN: [id number]  HPI Diffuse myalgias. No objective evidence of inflammation. Reported history of previous fibromyalgia. She has achiness in several muscles > joints. She's tried ice, ibuprofen and topical rubs without relief. Poor sleep quality. No regular exercise. Depression stable on Zoloft 100 mg. She's never tried muscle relaxers.  History of recurrent right upper quadrant pain with abnormal gallbladder nuclear scan. She had tentatively been scheduled for surgery but she is postponed. No recent recurrent episodes of pain.   Review of Systems  Constitutional: Negative for fever, appetite change and unexpected weight change.  Respiratory: Negative for chest tightness and shortness of breath.   Cardiovascular: Negative for chest pain.  Gastrointestinal: Negative for abdominal pain.  Genitourinary: Negative for dysuria.  Musculoskeletal: Positive for myalgias. Negative for joint swelling.  Neurological: Negative for headaches.       Objective:   Physical Exam  Constitutional: She is oriented to person, place, and time. She appears well-developed and well-nourished.  HENT:  Right Ear: External ear normal.  Left Ear: External ear normal.  Mouth/Throat: Oropharynx is clear and moist.  Neck: Neck supple. No thyromegaly present.  Cardiovascular: Normal rate, regular rhythm and normal heart sounds.   No murmur heard. Pulmonary/Chest: Effort normal and breath sounds normal. No respiratory distress. She has no wheezes. She has no rales.  Musculoskeletal: Normal range of motion. She exhibits tenderness. She exhibits no edema.       several muscular tender points and several these correlate with typical trigger points for fibromyalgia  Lymphadenopathy:    She has no cervical adenopathy.  Neurological: She is alert and oriented to person, place, and time.  Skin: No rash noted.  Psychiatric: She has a normal  mood and affect. Her behavior is normal.          Assessment & Plan:  Myalgias. Suspect fibromyalgia. Trial of Flexeril 5 mg one or 2 at night and regular aerobic exercise. Reassess in one month. If no further improvement then consider other options such as possible low-dose Lyrica

## 2010-08-14 ENCOUNTER — Other Ambulatory Visit: Payer: Self-pay | Admitting: Family Medicine

## 2010-09-11 ENCOUNTER — Encounter: Payer: Self-pay | Admitting: Family Medicine

## 2010-09-11 ENCOUNTER — Ambulatory Visit (INDEPENDENT_AMBULATORY_CARE_PROVIDER_SITE_OTHER): Payer: 59 | Admitting: Family Medicine

## 2010-09-11 VITALS — BP 120/84 | Temp 98.4°F | Wt 168.0 lb

## 2010-09-11 DIAGNOSIS — M222X9 Patellofemoral disorders, unspecified knee: Secondary | ICD-10-CM

## 2010-09-11 DIAGNOSIS — M259 Joint disorder, unspecified: Secondary | ICD-10-CM

## 2010-09-11 DIAGNOSIS — M791 Myalgia, unspecified site: Secondary | ICD-10-CM

## 2010-09-11 DIAGNOSIS — IMO0001 Reserved for inherently not codable concepts without codable children: Secondary | ICD-10-CM

## 2010-09-11 NOTE — Patient Instructions (Signed)
Patellofemoral Syndrome If you have had pain in the front of your knee for a long time, chances are good that you have patellofemoral syndrome. The word patella refers to the kneecap. Femoral (or femur) refers to the thigh bone. That is the bone the kneecap sits on. The kneecap is shaped like a triangle. Its job is to protect the knee and to improve the efficiency of your thigh muscles (quadriceps). The underside of the kneecap is made of smooth tissue (cartilage). This lets the kneecap slide up and down as the knee moves. Sometimes this cartilage becomes soft. Your healthcare provider may say the cartilage breaks down. That is patellofemoral syndrome. It can affect one knee, or both. The condition is sometimes called patellofemoral pain syndrome. That is because the condition is painful. The pain usually gets worse with activity. Sitting for a long time with the knee bent also makes the pain worse. It usually gets better with rest and proper treatment. CAUSES No one is sure why some people develop this problem and others do not. Runners often get it. One name for the condition is "runner's knee." However, some people run for years and never have knee pain. Certain things seem to make patellofemoral syndrome more likely. They include:  Moving out of alignment. The kneecap is supposed to move in a straight line when the thigh muscle pulls on it. Sometimes the kneecap moves in poor alignment. That can make the knee swell and hurt. Some experts believe it also wears down the cartilage.   Injury to the kneecap.   Strain on the knee. This may occur during sports activity. Soccer, running, skiing and cycling can put excess stress on the knee.   Being flat-footed or knock-kneed.  SYMPTOMS  Knee pain.   Pain under the kneecap. This is usually a dull, aching pain.   Pain in the knee when doing certain things: squatting, kneeling, going up or down stairs.   Pain in the knee when you stand up after sitting  down for awhile.   Tightness in the knee.   Loss of muscle strength in the thigh.   Swelling of the knee.  DIAGNOSIS Healthcare providers often send people with knee pain to an orthopedic caregiver. This person has special training to treat problems with bones and joints. To decide what is causing your knee pain, your caregiver will probably:  Do a physical exam. This will probably include:   Asking about symptoms you have noticed.   Asking about your activities and any injuries.   Feeling your knee. Moving it. This will help test the knee's strength. It will also check alignment (whether the knee and leg are aligned normally).   Order some tests, such as:   Imaging tests. They create pictures of the inside of the knee. Tests may include:  1. X-rays.  2. Computed tomography (CT) scan. This uses X-rays and a computer to show more detail.  3. Magnetic resonance imaging (MRI). This test uses magnets, radio waves and a computer to make pictures.  TREATMENT  Medication is almost always used first. It can relieve pain. It also can reduce swelling. Non-steroidal anti-inflammatory medicines (called NSAIDs) are usually suggested. Sometimes a stronger form is needed. A stronger form would require a prescription.   Other treatment may be needed after the swelling goes down. Possibilities include:   Exercise. Certain exercises can make the muscles around the knee stronger which decreases the pressure on the knee cap. This includes the thigh muscle. Certain exercises  also may be suggested to increase your flexibility.   A knee brace. This gives the knee extra support and helps align the movement of the knee cap.   Orthotics. These are special shoe inserts. They can help keep your leg and knee aligned.   Surgery is sometimes needed. This is rare. Options include:   Arthroscopy. The surgeon uses a special tool to remove any damaged pieces of the kneecap. Only a few small incisions (cuts) are  needed.   Realignment. This is open surgery. The goals are to reduce pressure and fix the way the kneecap moves.  PROGNOSIS Most people with patellofemoral syndrome get better without surgery. Medication almost always helps. So does rest. Sometimes changes in exercise may be needed. But, most people can return to their normal activities after the swelling and pain are reduced. HOME CARE INSTRUCTIONS  Take any medication prescribed by your healthcare provider. Follow the directions carefully.   If your knee is swollen:   Put ice or cold packs on it. Do this for 20 to 30 minutes, 3-4 times a day.   Keep the knee raised. Make sure it is supported. Put a pillow under it.   Rest your knee. For example, take the elevator instead of the stairs for awhile. Or, take a break from sports activity that strain your knee. Try walking or swimming instead.   Whenever you are active:   Use an elastic bandage on your knee. This gives it support.   After any activity, put ice or cold packs on your knees. Do this for about 10 to 20 minutes.   Make sure you wear shoes that give good support. Make sure they are not worn down. The heels should not slant in or out.  SEEK MEDICAL CARE IF:  Knee pain gets worse. Or it does not go away, even after taking pain medicine.   Swelling does not go down.   Your thigh muscle becomes weak.   You develop a fever of more than 100.30F (38.1 C).  SEEK IMMEDIATE MEDICAL CARE IF:  You develop a fever of more than 102.0 F (38.9 C).  Document Released: 12/06/2008  [facility] Patient Information 2011 [place], [place].

## 2010-09-11 NOTE — Progress Notes (Signed)
  Subjective:    Patient ID: Natasha Fitzgerald, female    DOB: 1965/04/21, 45 y.o.   MRN: [id number]  HPI Followup myalgias. Suspected fibromyalgia. Started low-dose Flexeril. Cannot tolerate 5 mg dose and taking only one half tablet at night which is helping sleep. Muscle pain is slightly improved. She is exercising but only about 2 days per week. No arthralgias. Sleep has improved with Flexeril. No adverse side effects.  Right knee pain. Poorly localized. Somewhere posterior to patella. Increased with certain activities. Occasionally noted at rest. No associated swelling. No locking or giving way. No alleviating factors. No history of knee injury. Sometimes exacerbated by prolonged periods of sitting.  She has not had any further episodes of abdominal pain, nausea, or vomiting. History of probable biliary dyskinesia   Review of Systems  Constitutional: Negative for fever, appetite change and unexpected weight change.  Respiratory: Negative for cough and shortness of breath.   Cardiovascular: Negative for chest pain.  Gastrointestinal: Negative for abdominal pain.  Musculoskeletal: Positive for myalgias.  Hematological: Negative for adenopathy.  Psychiatric/Behavioral: Negative for dysphoric mood.       Objective:   Physical Exam  Constitutional: She is oriented to person, place, and time. She appears well-developed and well-nourished.  Cardiovascular: Normal rate, regular rhythm and normal heart sounds.   No murmur heard. Pulmonary/Chest: Effort normal and breath sounds normal. No respiratory distress. She has no wheezes. She has no rales.  Musculoskeletal: She exhibits no edema.       Full range of motion right knee. No warmth erythema. No effusion. Ligament testing is normal.  Neurological: She is alert and oriented to person, place, and time. No cranial nerve deficit.  Psychiatric: She has a normal mood and affect. Her behavior is normal.          Assessment & Plan:  #1  probable fibromyalgia. Slightly improved with muscle relaxer. She is advised to increase regular aerobic exercise.  We discussed other medications such as Lyrica and Cymbalta and she is not interested at this time #2 right knee pain. 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 Advised regarding quadriceps exercises. Consider x-rays if not improving over several weeks

## 2010-10-11 ENCOUNTER — Encounter: Payer: Self-pay | Admitting: Family Medicine

## 2010-10-11 ENCOUNTER — Ambulatory Visit (INDEPENDENT_AMBULATORY_CARE_PROVIDER_SITE_OTHER): Payer: 59 | Admitting: Family Medicine

## 2010-10-11 VITALS — BP 120/80 | Temp 98.7°F | Wt 166.0 lb

## 2010-10-11 DIAGNOSIS — R059 Cough, unspecified: Secondary | ICD-10-CM

## 2010-10-11 DIAGNOSIS — Z23 Encounter for immunization: Secondary | ICD-10-CM

## 2010-10-11 DIAGNOSIS — R05 Cough: Secondary | ICD-10-CM

## 2010-10-11 MED ORDER — HYDROCOD POLST-CHLORPHEN POLST 10-8 MG/5ML PO LQCR: 5.0000 mL | Freq: Every evening | ORAL | Status: DC | PRN

## 2010-10-11 MED ORDER — BENZONATATE 200 MG PO CAPS: 200.0000 mg | ORAL_CAPSULE | Freq: Three times a day (TID) | ORAL | Status: DC | PRN

## 2010-10-11 NOTE — Progress Notes (Signed)
  Subjective:    Patient ID: Natasha Fitzgerald, female    DOB: [birth year], 45 y.o.   MRN: [id number]  HPI Nonsmoker seen with 2 week history of cough. Cough is mostly nonproductive. Some postnasal drainage. No fever. No active GERD symptoms. Has tried Delsym without relief. No history of asthma. No wheezing. Patient denies dyspnea.   Review of Systems  Constitutional: Negative for fever, chills and unexpected weight change.  HENT: Positive for postnasal drip.   Respiratory: Negative for cough, shortness of breath and wheezing.   Cardiovascular: Negative for chest pain.  Neurological: Negative for headaches.       Objective:   Physical Exam  Constitutional: She appears well-developed and well-nourished.  HENT:  Right Ear: External ear normal.  Left Ear: External ear normal.  Mouth/Throat: Oropharynx is clear and moist.  Neck: Neck supple. No thyromegaly present.  Cardiovascular: Normal rate, regular rhythm and normal heart sounds.   Pulmonary/Chest: Effort normal and breath sounds normal. No respiratory distress. She has no wheezes. She has no rales.  Musculoskeletal: She exhibits no edema.  Lymphadenopathy:    She has no cervical adenopathy.          Assessment & Plan:  Cough probably secondary to acute bronchitis. Possible allergic postnasal drip. Tussionex 1 teaspoon each bedtime at night. Tessalon Perles 200 mg every 8 hours during the day. Over-the-counter antihistamine for daytime use. If cough not better in 2 weeks consider discontinue ACE inhibitor but this cough has only been going on for couple weeks

## 2010-10-11 NOTE — Patient Instructions (Signed)
Be in touch if cough no better in 2 weeks.

## 2010-10-16 ENCOUNTER — Encounter: Payer: Self-pay | Admitting: Family Medicine

## 2010-10-16 ENCOUNTER — Ambulatory Visit (INDEPENDENT_AMBULATORY_CARE_PROVIDER_SITE_OTHER): Payer: 59 | Admitting: Family Medicine

## 2010-10-16 VITALS — BP 120/84 | Temp 98.4°F | Wt 167.0 lb

## 2010-10-16 DIAGNOSIS — R059 Cough, unspecified: Secondary | ICD-10-CM

## 2010-10-16 DIAGNOSIS — R05 Cough: Secondary | ICD-10-CM

## 2010-10-16 MED ORDER — LOSARTAN POTASSIUM 50 MG PO TABS: 50.0000 mg | ORAL_TABLET | Freq: Every day | ORAL | Status: DC

## 2010-10-16 MED ORDER — METHYLPREDNISOLONE ACETATE 80 MG/ML IJ SUSP
80.0000 mg | Freq: Once | INTRAMUSCULAR | Status: AC
  Administered 2010-10-16: 80 mg via INTRAMUSCULAR

## 2010-10-16 NOTE — Patient Instructions (Signed)
Call for any fever or if symptoms worsen or persist.

## 2010-10-16 NOTE — Progress Notes (Signed)
  Subjective:    Patient ID: Natasha Fitzgerald, female    DOB: 1965-02-12, 45 y.o.   MRN: [id number]  HPI Persistent cough. Going on over 2 weeks now. Cough remains dry. Question of some wheezing at night. No history of reactive airway disease. No fever. Patient has taken Tessalon and Tussionex cough syrup without much improvement. Poor sleep. Still some postnasal drainage. No purulent secretions. Nonsmoker. No GERD symptoms. Does take ACE inhibitor as per previous note. Denies any pleuritic pain or hemoptysis. No dyspnea at rest   Review of Systems  Constitutional: Positive for fatigue. Negative for fever, chills and unexpected weight change.  HENT: Negative for sore throat.   Respiratory: Positive for cough. Negative for shortness of breath.   Cardiovascular: Negative for chest pain.       Objective:   Physical Exam  Constitutional: She appears well-developed and well-nourished.  HENT:  Right Ear: External ear normal.  Left Ear: External ear normal.  Mouth/Throat: Oropharynx is clear and moist.  Neck: Neck supple. No thyromegaly present.  Cardiovascular: Normal rate and regular rhythm.   Pulmonary/Chest: Effort normal and breath sounds normal. No respiratory distress. She has no wheezes. She has no rales.  Lymphadenopathy:    She has no cervical adenopathy.          Assessment & Plan:  Persistent cough. Question mild reactive airway component though no wheezing heard today. May have some allergic postnasal drip symptoms as well. Discontinue lisinopril. Switched to losartan 50 mg daily. Continue Tussionex at night. Depo-Medrol 80 mg IM given. Followup promptly for fever or if cough not improving in the next couple days CXR.

## 2011-02-19 ENCOUNTER — Other Ambulatory Visit (INDEPENDENT_AMBULATORY_CARE_PROVIDER_SITE_OTHER): Payer: 59

## 2011-02-19 DIAGNOSIS — Z0000 Encounter for general adult medical examination without abnormal findings: Secondary | ICD-10-CM

## 2011-02-19 LAB — LIPID PANEL
HDL: 47.3 mg/dL (ref >39.00)
Total CHOL/HDL Ratio: 5
Triglycerides: 112 mg/dL (ref 0.0–149.0)
VLDL: 22.4 mg/dL (ref 0.0–40.0)

## 2011-02-19 LAB — BASIC METABOLIC PANEL
BUN: 13 mg/dL (ref 6–23)
CO2: 24 mEq/L (ref 19–32)
Calcium: 9.3 mg/dL (ref 8.4–10.5)
Chloride: 105 mEq/L (ref 96–112)
Creatinine, Ser: 0.7 mg/dL (ref 0.4–1.2)

## 2011-02-19 LAB — CBC WITH DIFFERENTIAL/PLATELET
Basophils Absolute: 0.1 10*3/uL (ref 0.0–0.1)
Eosinophils Absolute: 0.2 10*3/uL (ref 0.0–0.7)
Lymphocytes Relative: 23.1 % (ref 12.0–46.0)
MCHC: 33.7 g/dL (ref 30.0–36.0)
MCV: 84.8 fl (ref 78.0–100.0)
Monocytes Absolute: 0.6 10*3/uL (ref 0.1–1.0)
Neutrophils Relative %: 65.3 % (ref 43.0–77.0)
Platelets: 227 10*3/uL (ref 150.0–400.0)

## 2011-02-19 LAB — POCT URINALYSIS DIPSTICK
Bilirubin, UA: NEGATIVE
Nitrite, UA: NEGATIVE
Protein, UA: NEGATIVE
Urobilinogen, UA: 0.2
pH, UA: 5

## 2011-02-19 LAB — HEPATIC FUNCTION PANEL
Bilirubin, Direct: 0 mg/dL (ref 0.0–0.3)
Total Bilirubin: 0.4 mg/dL (ref 0.3–1.2)

## 2011-02-19 LAB — TSH: TSH: 6.93 u[IU]/mL — ABNORMAL HIGH (ref 0.35–5.50)

## 2011-03-02 ENCOUNTER — Ambulatory Visit (INDEPENDENT_AMBULATORY_CARE_PROVIDER_SITE_OTHER): Payer: 59 | Admitting: Family Medicine

## 2011-03-02 ENCOUNTER — Encounter: Payer: Self-pay | Admitting: Family Medicine

## 2011-03-02 VITALS — BP 130/90 | Temp 98.3°F | Wt 174.0 lb

## 2011-03-02 DIAGNOSIS — J329 Chronic sinusitis, unspecified: Secondary | ICD-10-CM

## 2011-03-02 MED ORDER — AZITHROMYCIN 250 MG PO TABS: ORAL_TABLET | ORAL | Status: DC

## 2011-03-02 NOTE — Patient Instructions (Signed)

## 2011-03-02 NOTE — Progress Notes (Signed)
  Subjective:    Patient ID: Natasha Fitzgerald, female    DOB: 03/12/1965, 46 y.o.   MRN: [id number]  HPI  Patient seen with almost 2 week history of sinus congestion. Started with cold-like symptoms. Had progressive greenish nasal discharge intermittent headaches malaise and occasional dry cough. Increased facial pain bilateral maxillary. Denies any nausea or vomiting. No ear pain   Review of Systems  Constitutional: Negative for fever and chills.  HENT: Positive for congestion and sinus pressure.   Respiratory: Negative for cough and shortness of breath.   Neurological: Positive for headaches.       Objective:   Physical Exam  Constitutional: She appears well-developed and well-nourished.  HENT:  Right Ear: External ear normal.  Left Ear: External ear normal.  Mouth/Throat: Oropharynx is clear and moist.  Neck: Neck supple.  Cardiovascular: Normal rate, regular rhythm and normal heart sounds.   Pulmonary/Chest: Effort normal and breath sounds normal. No respiratory distress. She has no wheezes. She has no rales.  Lymphadenopathy:    She has no cervical adenopathy.          Assessment & Plan:  Acute sinusitis. Zithromax for 5 days. Followup if symptoms persist or worsen

## 2011-03-07 ENCOUNTER — Other Ambulatory Visit (HOSPITAL_COMMUNITY)
Admission: RE | Admit: 2011-03-07 | Discharge: 2011-03-07 | Disposition: A | Discharge status: Home / Self Care | Payer: 59 | Source: Ambulatory Visit | Attending: Family Medicine | Admitting: Family Medicine

## 2011-03-07 ENCOUNTER — Ambulatory Visit (INDEPENDENT_AMBULATORY_CARE_PROVIDER_SITE_OTHER): Payer: 59 | Admitting: Family Medicine

## 2011-03-07 ENCOUNTER — Encounter: Payer: Self-pay | Admitting: Family Medicine

## 2011-03-07 VITALS — BP 138/88 | HR 80 | Temp 98.4°F | Resp 12 | Ht 63.0 in | Wt 171.0 lb

## 2011-03-07 DIAGNOSIS — Z01419 Encounter for gynecological examination (general) (routine) without abnormal findings: Secondary | ICD-10-CM | POA: Insufficient documentation

## 2011-03-07 DIAGNOSIS — Z0000 Encounter for general adult medical examination without abnormal findings: Secondary | ICD-10-CM

## 2011-03-07 DIAGNOSIS — E039 Hypothyroidism, unspecified: Secondary | ICD-10-CM | POA: Insufficient documentation

## 2011-03-07 DIAGNOSIS — I10 Essential (primary) hypertension: Secondary | ICD-10-CM

## 2011-03-07 MED ORDER — LEVOTHYROXINE SODIUM 25 MCG PO TABS: 25.0000 ug | ORAL_TABLET | Freq: Every day | ORAL | Status: DC

## 2011-03-07 MED ORDER — LOSARTAN POTASSIUM 50 MG PO TABS: 50.0000 mg | ORAL_TABLET | Freq: Every day | ORAL | Status: DC

## 2011-03-07 NOTE — Progress Notes (Signed)
Subjective:    Patient ID: Natasha Fitzgerald, female    DOB: [birth year], 46 y.o.   MRN: [id number]  HPI  Patient here for complete physical. Last Pap smear almost 2 years ago. She has never had mammogram. Pap is up-to-date.  Strong family history of hypothyroidism. Recent issues with difficulty losing weight and fatigue. Previous thyroid functions have been normal.  Hypertension treated with losartan 50 mg daily. Blood pressure generally well controlled. Occasionally misses doses. No headaches or dizziness. No chest pain.  History of depression stable on sertraline. She is reluctant to stop. No major anxiety issues.  Past Medical History  Diagnosis Date  . DEPRESSION 09/28/2009  . HYPERTENSION 09/28/2009  . IRREGULAR MENSES 09/28/2009  . Chronic headaches 09/28/2009  . Fibromyalgia   . Anxiety   . History of kidney stones   . RUQ pain    Past Surgical History  Procedure Date  . Cesarean section 1997  . Tonsillectomy 1973    reports that she has never smoked. She has never used smokeless tobacco. She reports that she drinks alcohol. She reports that she does not use illicit drugs. family history includes Breast cancer in her maternal grandmother; Heart disease in her maternal grandfather and paternal grandfather; and Hypertension in her mother.  There is no history of Colon cancer. Allergies  Allergen Reactions  . Penicillins     REACTION: hives      Review of Systems  Constitutional: Positive for fatigue. Negative for fever, activity change, appetite change and unexpected weight change.  HENT: Negative for hearing loss, ear pain, sore throat and trouble swallowing.   Eyes: Negative for visual disturbance.  Respiratory: Negative for cough and shortness of breath.   Cardiovascular: Negative for chest pain and palpitations.  Gastrointestinal: Negative for abdominal pain, diarrhea, constipation and blood in stool.  Genitourinary: Negative for dysuria and hematuria.    Musculoskeletal: Negative for myalgias, back pain and arthralgias.  Skin: Negative for rash.  Neurological: Negative for dizziness, syncope and headaches.  Hematological: Negative for adenopathy.  Psychiatric/Behavioral: Negative for confusion and dysphoric mood.       Objective:   Physical Exam  Constitutional: She is oriented to person, place, and time. She appears well-developed and well-nourished.  HENT:  Head: Normocephalic and atraumatic.  Eyes: EOM are normal. Pupils are equal, round, and reactive to light.  Neck: Normal range of motion. Neck supple. No thyromegaly present.  Cardiovascular: Normal rate, regular rhythm and normal heart sounds.   No murmur heard. Pulmonary/Chest: Breath sounds normal. No respiratory distress. She has no wheezes. She has no rales.  Abdominal: Soft. Bowel sounds are normal. She exhibits no distension and no mass. There is no tenderness. There is no rebound and no guarding.  Genitourinary: Vagina normal and uterus normal.       Breasts symmetric without masses. Pap smear obtained. She has some copious white vaginal mucus otherwise normal  Musculoskeletal: Normal range of motion. She exhibits no edema.  Lymphadenopathy:    She has no cervical adenopathy.  Neurological: She is alert and oriented to person, place, and time. She displays normal reflexes. No cranial nerve deficit.  Skin: No rash noted.  Psychiatric: She has a normal mood and affect. Her behavior is normal. Judgment and thought content normal.          Assessment & Plan:   #1 complete physical. Recommend baseline mammogram. Pap smear obtained. Labs reviewed with patient. Discussed exercise and weight control #2 hypothyroidism which is a new problem based  on recent labs. She has symptoms of fatigue and difficulty with weight gain. Start Levothroid 25 mg daily recheck 3 months #3 hypertension. Continue Cozaar 50 mg daily. Prescription given with refills

## 2011-03-07 NOTE — Patient Instructions (Signed)
Be sure to go ahead and schedule baseline mammogram

## 2011-03-13 NOTE — Progress Notes (Signed)
Quick Note:  Pt informed on cell VM ______ 

## 2011-05-14 ENCOUNTER — Other Ambulatory Visit: Payer: Self-pay | Admitting: Family Medicine

## 2011-05-14 DIAGNOSIS — Z1231 Encounter for screening mammogram for malignant neoplasm of breast: Secondary | ICD-10-CM

## 2011-05-30 ENCOUNTER — Ambulatory Visit
Admission: RE | Admit: 2011-05-30 | Discharge: 2011-05-30 | Disposition: A | Discharge status: Home / Self Care | Payer: 59 | Source: Ambulatory Visit | Attending: Family Medicine | Admitting: Family Medicine

## 2011-05-30 DIAGNOSIS — Z1231 Encounter for screening mammogram for malignant neoplasm of breast: Secondary | ICD-10-CM

## 2011-06-07 ENCOUNTER — Ambulatory Visit (INDEPENDENT_AMBULATORY_CARE_PROVIDER_SITE_OTHER): Payer: 59 | Admitting: Family Medicine

## 2011-06-07 ENCOUNTER — Encounter: Payer: Self-pay | Admitting: Family Medicine

## 2011-06-07 VITALS — BP 130/88 | Temp 97.8°F | Wt 172.0 lb

## 2011-06-07 DIAGNOSIS — E039 Hypothyroidism, unspecified: Secondary | ICD-10-CM

## 2011-06-07 DIAGNOSIS — I10 Essential (primary) hypertension: Secondary | ICD-10-CM

## 2011-06-07 MED ORDER — SERTRALINE HCL 100 MG PO TABS: 100.0000 mg | ORAL_TABLET | Freq: Every day | ORAL | Status: DC

## 2011-06-07 NOTE — Progress Notes (Signed)
  Subjective:    Patient ID: Natasha Fitzgerald, female    DOB: 1965-06-12, 46 y.o.   MRN: [id number]  HPI  Followup from recent physical. Hypothyroidism. Initiated low-dose thyroid replacement and she has had more energy since then. Still not exercising consistently. She's had some recent bilateral knee pain and orthopedist started her on meloxicam which has helped somewhat. She is walking without pain at this time. Weight is relatively stable. She has history of hypertension treated with losartan 50 mg. No headaches or dizziness.   Review of Systems  Constitutional: Negative for fatigue.  Eyes: Negative for visual disturbance.  Respiratory: Negative for cough, chest tightness, shortness of breath and wheezing.   Cardiovascular: Negative for chest pain, palpitations and leg swelling.  Neurological: Negative for dizziness, seizures, syncope, weakness, light-headedness and headaches.       Objective:   Physical Exam  Constitutional: She appears well-developed and well-nourished.  Neck: Neck supple. No thyromegaly present.  Cardiovascular: Normal rate and regular rhythm.   Pulmonary/Chest: Effort normal and breath sounds normal. No respiratory distress. She has no wheezes. She has no rales.  Musculoskeletal: She exhibits no edema.          Assessment & Plan:  #1 hypothyroidism. Recheck TSH and adjust accordingly #2 hypertension. Marginal control. Work on establishing aerobic exercise and weight loss. Routine followup 6 months

## 2011-06-12 NOTE — Progress Notes (Signed)
Quick Note:  Pt informed on personally identified VM ______ 

## 2011-06-17 ENCOUNTER — Other Ambulatory Visit: Payer: Self-pay | Admitting: Family Medicine

## 2012-02-28 ENCOUNTER — Other Ambulatory Visit: Payer: Self-pay | Admitting: Family Medicine

## 2012-03-10 ENCOUNTER — Other Ambulatory Visit: Payer: Self-pay | Admitting: Family Medicine

## 2012-04-21 ENCOUNTER — Encounter: Payer: Self-pay | Admitting: Family Medicine

## 2012-04-21 ENCOUNTER — Ambulatory Visit (INDEPENDENT_AMBULATORY_CARE_PROVIDER_SITE_OTHER): Payer: 59 | Admitting: Family Medicine

## 2012-04-21 VITALS — BP 110/82 | Temp 97.6°F | Wt 176.0 lb

## 2012-04-21 DIAGNOSIS — F3289 Other specified depressive episodes: Secondary | ICD-10-CM

## 2012-04-21 DIAGNOSIS — E039 Hypothyroidism, unspecified: Secondary | ICD-10-CM

## 2012-04-21 DIAGNOSIS — M159 Polyosteoarthritis, unspecified: Secondary | ICD-10-CM

## 2012-04-21 DIAGNOSIS — I10 Essential (primary) hypertension: Secondary | ICD-10-CM

## 2012-04-21 DIAGNOSIS — F329 Major depressive disorder, single episode, unspecified: Secondary | ICD-10-CM

## 2012-04-21 LAB — TSH: TSH: 7.33 u[IU]/mL — ABNORMAL HIGH (ref 0.35–5.50)

## 2012-04-21 MED ORDER — MELOXICAM 15 MG PO TABS: 15.0000 mg | ORAL_TABLET | Freq: Every day | ORAL | Status: DC | PRN

## 2012-04-21 MED ORDER — TRAMADOL HCL 50 MG PO TABS: 50.0000 mg | ORAL_TABLET | Freq: Four times a day (QID) | ORAL | Status: DC | PRN

## 2012-04-21 MED ORDER — CYCLOBENZAPRINE HCL 5 MG PO TABS: 5.0000 mg | ORAL_TABLET | Freq: Every evening | ORAL | Status: DC | PRN

## 2012-04-21 MED ORDER — LOSARTAN POTASSIUM 50 MG PO TABS: 50.0000 mg | ORAL_TABLET | Freq: Every day | ORAL | Status: DC

## 2012-04-21 MED ORDER — LEVOTHYROXINE SODIUM 25 MCG PO TABS: 25.0000 ug | ORAL_TABLET | Freq: Every day | ORAL | Status: DC

## 2012-04-21 MED ORDER — SERTRALINE HCL 100 MG PO TABS: 100.0000 mg | ORAL_TABLET | Freq: Every day | ORAL | Status: DC

## 2012-04-21 NOTE — Patient Instructions (Addendum)
Schedule complete physical. 

## 2012-04-21 NOTE — Progress Notes (Signed)
  Subjective:    Patient ID: Natasha Fitzgerald, female    DOB: 31-Jul-1965, 47 y.o.   MRN: [id number]  HPI Patient here medical followup Problems include hypertension, depression, hypothyroidism Medications reviewed and compliant with all. Blood pressures have been well controlled. No recent orthostasis. No recent headaches.  She's had some recent problems with osteoarthritis involving knees. She has seen orthopedist with previous x-rays. Requesting refills of Mobic which has helped tremendously in the past. She denies any side effects. She takes Flexeril intermittently for some chronic upper back stiffness and pain.  Depression stable on sertraline 100 mg daily. She is reluctant to stop. No side effects.  Hypothyroidism treated with low-dose levothyroxine 25 mcg daily. Needs followup lab work.  Past Medical History  Diagnosis Date  . DEPRESSION 09/28/2009  . HYPERTENSION 09/28/2009  . IRREGULAR MENSES 09/28/2009  . Chronic headaches 09/28/2009  . Fibromyalgia   . Anxiety   . History of kidney stones   . RUQ pain    Past Surgical History  Procedure Laterality Date  . Cesarean section  1997  . Tonsillectomy  1973    reports that she has never smoked. She has never used smokeless tobacco. She reports that  drinks alcohol. She reports that she does not use illicit drugs. family history includes Breast cancer in her maternal grandmother; Heart disease in her maternal grandfather and paternal grandfather; and Hypertension in her mother.  There is no history of Colon cancer. Allergies  Allergen Reactions  . Penicillins     REACTION: hives  ]    Review of Systems  Constitutional: Positive for fatigue.  Eyes: Negative for visual disturbance.  Respiratory: Negative for cough, chest tightness, shortness of breath and wheezing.   Cardiovascular: Negative for chest pain, palpitations and leg swelling.  Musculoskeletal: Positive for arthralgias.  Neurological: Negative for dizziness,  seizures, syncope, weakness, light-headedness and headaches.       Objective:   Physical Exam  Constitutional: She appears well-developed and well-nourished. No distress.  Neck: Neck supple. No thyromegaly present.  Cardiovascular: Normal rate and regular rhythm.   No murmur heard. Pulmonary/Chest: Effort normal and breath sounds normal. No respiratory distress. She has no wheezes. She has no rales.  Musculoskeletal: She exhibits no edema.  Psychiatric: She has a normal mood and affect. Her behavior is normal. Judgment and thought content normal.          Assessment & Plan:  #1 hypertension. Stable. Continue current medication. Refill losartan for one year #2 history of depression currently stable. Refilled sertraline. She is reluctant to taper back or stop at this time #3 osteoarthritis mostly involving knees. Refill meloxicam #4 hypothyroidism. Recheck TSH. Refill levothyroxine #5 health maintenance. Patient will schedule complete physical

## 2012-04-22 ENCOUNTER — Other Ambulatory Visit: Payer: Self-pay | Admitting: *Deleted

## 2012-04-22 DIAGNOSIS — E039 Hypothyroidism, unspecified: Secondary | ICD-10-CM

## 2012-04-22 MED ORDER — LEVOTHYROXINE SODIUM 50 MCG PO TABS: 50.0000 ug | ORAL_TABLET | Freq: Every day | ORAL | Status: DC

## 2012-04-22 NOTE — Progress Notes (Signed)
Quick Note:  Pt informed on VM, labs and meds sent ______

## 2012-06-17 ENCOUNTER — Other Ambulatory Visit (INDEPENDENT_AMBULATORY_CARE_PROVIDER_SITE_OTHER): Payer: 59

## 2012-06-17 DIAGNOSIS — E039 Hypothyroidism, unspecified: Secondary | ICD-10-CM

## 2012-06-17 DIAGNOSIS — Z0000 Encounter for general adult medical examination without abnormal findings: Secondary | ICD-10-CM

## 2012-06-17 LAB — CBC WITH DIFFERENTIAL/PLATELET
Eosinophils Relative: 1.3 % (ref 0.0–5.0)
HCT: 44.5 % (ref 36.0–46.0)
Hemoglobin: 15 g/dL (ref 12.0–15.0)
Lymphs Abs: 1.2 10*3/uL (ref 0.7–4.0)
Monocytes Relative: 6.8 % (ref 3.0–12.0)
Neutro Abs: 4.6 10*3/uL (ref 1.4–7.7)
Platelets: 276 10*3/uL (ref 150.0–400.0)
WBC: 6.4 10*3/uL (ref 4.5–10.5)

## 2012-06-17 LAB — POCT URINALYSIS DIPSTICK
Bilirubin, UA: NEGATIVE
Ketones, UA: NEGATIVE
Nitrite, UA: NEGATIVE
pH, UA: 7.5

## 2012-06-18 LAB — BASIC METABOLIC PANEL
GFR: 90.59 mL/min (ref >60.00)
Potassium: 4.6 mEq/L (ref 3.5–5.1)
Sodium: 143 mEq/L (ref 135–145)

## 2012-06-18 LAB — LDL CHOLESTEROL, DIRECT: Direct LDL: 178.9 mg/dL

## 2012-06-18 LAB — TSH: TSH: 3.01 u[IU]/mL (ref 0.35–5.50)

## 2012-06-18 LAB — HEPATIC FUNCTION PANEL
AST: 21 U/L (ref 0–37)
Albumin: 4.2 g/dL (ref 3.5–5.2)
Alkaline Phosphatase: 81 U/L (ref 39–117)

## 2012-06-18 LAB — LIPID PANEL
HDL: 43.7 mg/dL (ref >39.00)
VLDL: 26 mg/dL (ref 0.0–40.0)

## 2012-06-23 ENCOUNTER — Ambulatory Visit (INDEPENDENT_AMBULATORY_CARE_PROVIDER_SITE_OTHER): Payer: 59 | Admitting: Family Medicine

## 2012-06-23 ENCOUNTER — Encounter: Payer: Self-pay | Admitting: Family Medicine

## 2012-06-23 ENCOUNTER — Other Ambulatory Visit (HOSPITAL_COMMUNITY)
Admission: RE | Admit: 2012-06-23 | Discharge: 2012-06-23 | Disposition: A | Discharge status: Home / Self Care | Payer: 59 | Source: Ambulatory Visit | Attending: Family Medicine | Admitting: Family Medicine

## 2012-06-23 VITALS — BP 114/80 | HR 78 | Temp 98.0°F | Resp 16 | Ht 63.0 in | Wt 178.0 lb

## 2012-06-23 DIAGNOSIS — Z01419 Encounter for gynecological examination (general) (routine) without abnormal findings: Secondary | ICD-10-CM | POA: Insufficient documentation

## 2012-06-23 DIAGNOSIS — Z0000 Encounter for general adult medical examination without abnormal findings: Secondary | ICD-10-CM

## 2012-06-23 MED ORDER — OMEPRAZOLE 20 MG PO CPDR: 20.0000 mg | DELAYED_RELEASE_CAPSULE | Freq: Every day | ORAL | Status: DC

## 2012-06-23 MED ORDER — LEVOTHYROXINE SODIUM 50 MCG PO TABS: 50.0000 ug | ORAL_TABLET | Freq: Every day | ORAL | Status: DC

## 2012-06-23 MED ORDER — OMEPRAZOLE MAGNESIUM 20 MG PO TBEC: 20.0000 mg | DELAYED_RELEASE_TABLET | Freq: Every day | ORAL | Status: DC

## 2012-06-23 MED ORDER — OMEPRAZOLE 20 MG PO TBEC: DELAYED_RELEASE_TABLET | ORAL | Status: DC

## 2012-06-23 NOTE — Progress Notes (Signed)
  Subjective:    Patient ID: Natasha Fitzgerald, female    DOB: 1965-03-24, 47 y.o.   MRN: [id number]  HPI Patient seen for complete physical Past medical history significant for hypertension, history of biliary dyskinesia, history depression, hypothyroidism, and osteoarthritis. Medications reviewed and compliant with all. Tetanus up-to-date. She's been receiving yearly mammograms and sets up on her own.  She's had remote history of abnormal Pap smear several years ago and though she's had at least 3 consecutive normals she prefers to get yearly Pap smear.  Major concern is weight gain. She has poor dietary compliance especially with fast food. Not exercising regularly.  Past Medical History  Diagnosis Date  . DEPRESSION 09/28/2009  . HYPERTENSION 09/28/2009  . IRREGULAR MENSES 09/28/2009  . Chronic headaches 09/28/2009  . Fibromyalgia   . Anxiety   . History of kidney stones   . RUQ pain    Past Surgical History  Procedure Laterality Date  . Cesarean section  1997  . Tonsillectomy  1973    reports that she has never smoked. She has never used smokeless tobacco. She reports that  drinks alcohol. She reports that she does not use illicit drugs. family history includes Breast cancer in her maternal grandmother; Heart disease in her maternal grandfather and paternal grandfather; and Hypertension in her mother.  There is no history of Colon cancer. Allergies  Allergen Reactions  . Penicillins     REACTION: hives      Review of Systems  Constitutional: Positive for fatigue. Negative for fever, activity change, appetite change and unexpected weight change.  HENT: Negative for hearing loss, ear pain, sore throat and trouble swallowing.   Eyes: Negative for visual disturbance.  Respiratory: Negative for cough and shortness of breath.   Cardiovascular: Negative for chest pain and palpitations.  Gastrointestinal: Negative for abdominal pain, diarrhea, constipation and blood in stool.   Endocrine: Negative for polydipsia and polyuria.  Genitourinary: Negative for dysuria and hematuria.  Musculoskeletal: Negative for myalgias, back pain and arthralgias.  Skin: Negative for rash.  Neurological: Negative for dizziness, syncope and headaches.  Hematological: Negative for adenopathy.  Psychiatric/Behavioral: Negative for confusion and dysphoric mood.       Objective:   Physical Exam  Constitutional: She is oriented to person, place, and time. She appears well-developed and well-nourished.  HENT:  Head: Normocephalic and atraumatic.  Eyes: EOM are normal. Pupils are equal, round, and reactive to light.  Neck: Normal range of motion. Neck supple. No thyromegaly present.  Cardiovascular: Normal rate, regular rhythm and normal heart sounds.   No murmur heard. Pulmonary/Chest: Breath sounds normal. No respiratory distress. She has no wheezes. She has no rales.  Abdominal: Soft. Bowel sounds are normal. She exhibits no distension and no mass. There is no tenderness. There is no rebound and no guarding.  Genitourinary: Vagina normal and uterus normal. No vaginal discharge found.  No breast masses.  Musculoskeletal: Normal range of motion. She exhibits no edema.  Lymphadenopathy:    She has no cervical adenopathy.  Neurological: She is alert and oriented to person, place, and time. She displays normal reflexes. No cranial nerve deficit.  Skin: No rash noted.  Psychiatric: She has a normal mood and affect. Her behavior is normal. Judgment and thought content normal.          Assessment & Plan:  Health maintenance. Discussed strategies for weight loss. Strongly recommend establishing regular exercise. Discussed cholesterol control diet. Obtain Pap smear. Patient will schedule mammogram

## 2012-06-23 NOTE — Patient Instructions (Addendum)

## 2012-06-24 ENCOUNTER — Encounter: Payer: Self-pay | Admitting: Family Medicine

## 2012-06-25 ENCOUNTER — Encounter: Payer: Self-pay | Admitting: Family Medicine

## 2012-06-26 MED ORDER — OMEPRAZOLE 20 MG PO CPDR: 20.0000 mg | DELAYED_RELEASE_CAPSULE | Freq: Every day | ORAL | Status: DC

## 2012-06-26 NOTE — Telephone Encounter (Signed)
Rx request to pharmacy/[facility]  

## 2012-11-03 ENCOUNTER — Other Ambulatory Visit: Payer: Self-pay

## 2012-11-03 DIAGNOSIS — Z1231 Encounter for screening mammogram for malignant neoplasm of breast: Secondary | ICD-10-CM

## 2012-11-06 ENCOUNTER — Other Ambulatory Visit: Payer: Self-pay

## 2012-11-27 ENCOUNTER — Other Ambulatory Visit: Payer: Self-pay | Admitting: Family Medicine

## 2012-12-01 ENCOUNTER — Ambulatory Visit: Payer: 59

## 2013-02-04 ENCOUNTER — Ambulatory Visit (INDEPENDENT_AMBULATORY_CARE_PROVIDER_SITE_OTHER): Payer: BC Managed Care – PPO | Admitting: Family Medicine

## 2013-02-04 ENCOUNTER — Encounter: Payer: Self-pay | Admitting: Family Medicine

## 2013-02-04 VITALS — BP 120/80 | HR 88 | Temp 97.7°F | Wt 174.0 lb

## 2013-02-04 DIAGNOSIS — M6530 Trigger finger, unspecified finger: Secondary | ICD-10-CM

## 2013-02-04 DIAGNOSIS — M65312 Trigger thumb, left thumb: Secondary | ICD-10-CM

## 2013-02-04 MED ORDER — METHYLPREDNISOLONE ACETATE 40 MG/ML IJ SUSP
40.0000 mg | Freq: Once | INTRAMUSCULAR | Status: AC
  Administered 2013-02-04: 40 mg via INTRA_ARTICULAR

## 2013-02-04 NOTE — Progress Notes (Signed)
Pre visit review using our clinic review tool, if applicable. No additional management support is needed unless otherwise documented below in the visit note. 

## 2013-02-04 NOTE — Patient Instructions (Signed)
Trigger Finger  Trigger finger (digital tendinitis and stenosing tenosynovitis) is a common disorder that causes an often painful catching of the fingers or thumb. It occurs as a clicking, snapping, or locking of a finger in the palm of the hand. This is caused by a problem with the tendons that flex or bend the fingers sliding smoothly through their sheaths. The condition may occur in any finger or a couple fingers at the same time.   The finger may lock with the finger curled or suddenly straighten out with a snap. This is more common in patients with rheumatoid arthritis and diabetes. Left untreated, the condition may get worse to the point where the finger becomes locked in flexion, like making a fist, or less commonly locked with the finger straightened out.  CAUSES    Inflammation and scarring that lead to swelling around the tendon sheath.   Repeated or forceful movements.   Rheumatoid arthritis, an autoimmune disease that affects joints.   Gout.   Diabetes mellitus.  SIGNS AND SYMPTOMS   Soreness and swelling of your finger.   A painful clicking or snapping as you bend and straighten your finger.  DIAGNOSIS   Your health care provider will do a physical exam of your finger to diagnose trigger finger.  TREATMENT    Splinting for 6 8 weeks may be helpful.   Nonsteroidal anti-inflammatory medicines (NSAIDs) can help to relieve the pain and inflammation.   Cortisone injections, along with splinting, may speed up recovery. Several injections may be required. Cortisone may give relief after one injection.   Surgery is another treatment that may be used if conservative treatments do not work. Surgery can be minor, without incisions (a cut does not have to be made), and can be done with a needle through the skin.   Other surgical choices involve an open procedure in which the surgeon opens the hand through a small incision and cuts the pulley so the tendon can again slide smoothly. Your hand will still  work fine.  HOME CARE INSTRUCTIONS   Apply ice to the injured area, twice per day:   Put ice in a plastic bag.   Place a towel between your skin and the bag.   Leave the ice on for 20 minutes, 3 4 times a day.   Rest your hand often.  MAKE SURE YOU:    Understand these instructions.   Will watch your condition.   Will get help right away if you are not doing well or get worse.  Document Released: 10/08/2003 Document Revised: 08/20/2012 Document Reviewed: 05/20/2012  [employer] Patient Information 2014 [employer]

## 2013-02-04 NOTE — Progress Notes (Signed)
   Subjective:    Patient ID: Natasha Fitzgerald, female    DOB: 29-Jul-1965, 48 y.o.   MRN: [id number]  Hand Pain  Pertinent negatives include no numbness.   Patient seen with left thumb pain She is right-hand dominant. She's had about 3 months of progressive pain and over the past couple of weeks has noticed a "catching sensation" in her left thumb. Her pain is mostly along the palm/volar surface of the thumb MCP joint. She has not noted any erythema or swelling. No ecchymosis. No warmth. She uses her hands a lot with work. Denies any other significant arthralgias  Past Medical History  Diagnosis Date  . DEPRESSION 09/28/2009  . HYPERTENSION 09/28/2009  . IRREGULAR MENSES 09/28/2009  . Chronic headaches 09/28/2009  . Fibromyalgia   . Anxiety   . History of kidney stones   . RUQ pain    Past Surgical History  Procedure Laterality Date  . Cesarean section  1997  . Tonsillectomy  1973    reports that she has never smoked. She has never used smokeless tobacco. She reports that she drinks alcohol. She reports that she does not use illicit drugs. family history includes Breast cancer in her maternal grandmother; Heart disease in her maternal grandfather and paternal grandfather; Hypertension in her mother. There is no history of Colon cancer. Allergies  Allergen Reactions  . Penicillins     REACTION: hives      Review of Systems  Neurological: Negative for weakness and numbness.  Hematological: Negative for adenopathy.       Objective:   Physical Exam  Constitutional: She appears well-developed and well-nourished.  Cardiovascular: Normal rate.   Musculoskeletal:  Left thumb reveals full range of motion. She has trigger thumb with tender nodule just proximal to the MCP joint. No erythema. No warmth. No ecchymosis. Wrist reveals full range of motion with no visible swelling or warmth          Assessment & Plan:  Left trigger thumb. We reviewed treatment options. She's had  progressive pain for 3 months. We reviewed risks and benefits of injection with corticosteroid and patient consented. Prepped left thumb with Betadine. Using 25-gauge 5/8 needle injected 20 mg Depo-Medrol into tendon sheath and the proximity of tender nodule. Patient tolerated well. We've recommended some icing. We discussed possible splinting for the next couple of weeks. Touch base by phone in 2 weeks if not improving

## 2013-03-18 ENCOUNTER — Telehealth: Payer: Self-pay | Admitting: Family Medicine

## 2013-03-18 NOTE — Telephone Encounter (Signed)
Clarification done

## 2013-03-18 NOTE — Telephone Encounter (Signed)
[facility] needs clarification on rx levothyroxine (SYNTHROID, LEVOTHROID) 50 MCG tablet reference no   [id number]

## 2013-04-06 ENCOUNTER — Other Ambulatory Visit: Payer: Self-pay | Admitting: Family Medicine

## 2013-04-16 ENCOUNTER — Other Ambulatory Visit: Payer: Self-pay | Admitting: Family Medicine

## 2013-04-25 ENCOUNTER — Encounter: Payer: Self-pay | Admitting: Family Medicine

## 2013-04-27 ENCOUNTER — Other Ambulatory Visit: Payer: Self-pay

## 2013-04-27 MED ORDER — LOSARTAN POTASSIUM 50 MG PO TABS: ORAL_TABLET | ORAL | Status: DC

## 2013-06-08 ENCOUNTER — Encounter: Payer: Self-pay | Admitting: Family Medicine

## 2013-06-09 ENCOUNTER — Other Ambulatory Visit: Payer: Self-pay

## 2013-06-09 MED ORDER — MELOXICAM 15 MG PO TABS: 15.0000 mg | ORAL_TABLET | Freq: Every day | ORAL | Status: DC | PRN

## 2013-07-01 ENCOUNTER — Other Ambulatory Visit (INDEPENDENT_AMBULATORY_CARE_PROVIDER_SITE_OTHER): Payer: BC Managed Care – PPO

## 2013-07-01 DIAGNOSIS — Z0000 Encounter for general adult medical examination without abnormal findings: Secondary | ICD-10-CM

## 2013-07-01 LAB — CBC WITH DIFFERENTIAL/PLATELET
BASOS PCT: 0.4 % (ref 0.0–3.0)
Basophils Absolute: 0 10*3/uL (ref 0.0–0.1)
EOS ABS: 0.2 10*3/uL (ref 0.0–0.7)
Eosinophils Relative: 2.4 % (ref 0.0–5.0)
HCT: 43.7 % (ref 36.0–46.0)
Hemoglobin: 14.7 g/dL (ref 12.0–15.0)
LYMPHS PCT: 14.1 % (ref 12.0–46.0)
Lymphs Abs: 1.2 10*3/uL (ref 0.7–4.0)
MCHC: 33.7 g/dL (ref 30.0–36.0)
MCV: 86.2 fl (ref 78.0–100.0)
MONO ABS: 0.7 10*3/uL (ref 0.1–1.0)
Monocytes Relative: 8.1 % (ref 3.0–12.0)
NEUTROS ABS: 6.7 10*3/uL (ref 1.4–7.7)
NEUTROS PCT: 75 % (ref 43.0–77.0)
Platelets: 238 10*3/uL (ref 150.0–400.0)
RBC: 5.07 Mil/uL (ref 3.87–5.11)
RDW: 13.5 % (ref 11.5–15.5)
WBC: 8.9 10*3/uL (ref 4.0–10.5)

## 2013-07-01 LAB — HEPATIC FUNCTION PANEL
ALBUMIN: 4.1 g/dL (ref 3.5–5.2)
ALT: 20 U/L (ref 0–35)
AST: 18 U/L (ref 0–37)
Alkaline Phosphatase: 93 U/L (ref 39–117)
BILIRUBIN DIRECT: 0 mg/dL (ref 0.0–0.3)
TOTAL PROTEIN: 7.3 g/dL (ref 6.0–8.3)
Total Bilirubin: 0.7 mg/dL (ref 0.2–1.2)

## 2013-07-01 LAB — POCT URINALYSIS DIPSTICK
Bilirubin, UA: NEGATIVE
Glucose, UA: NEGATIVE
Ketones, UA: NEGATIVE
LEUKOCYTES UA: NEGATIVE
Nitrite, UA: NEGATIVE
PROTEIN UA: NEGATIVE
Spec Grav, UA: 1.02
Urobilinogen, UA: 0.2
pH, UA: 5.5

## 2013-07-01 LAB — TSH: TSH: 5.28 u[IU]/mL — ABNORMAL HIGH (ref 0.35–4.50)

## 2013-07-01 LAB — BASIC METABOLIC PANEL
BUN: 14 mg/dL (ref 6–23)
CALCIUM: 9.4 mg/dL (ref 8.4–10.5)
CHLORIDE: 105 meq/L (ref 96–112)
CO2: 27 meq/L (ref 19–32)
CREATININE: 0.8 mg/dL (ref 0.4–1.2)
GFR: 86.1 mL/min (ref >60.00)
GLUCOSE: 94 mg/dL (ref 70–99)
Potassium: 4.2 mEq/L (ref 3.5–5.1)
Sodium: 140 mEq/L (ref 135–145)

## 2013-07-01 LAB — LIPID PANEL
CHOL/HDL RATIO: 5
Cholesterol: 218 mg/dL — ABNORMAL HIGH (ref 0–200)
HDL: 42.4 mg/dL (ref >39.00)
LDL Cholesterol: 135 mg/dL — ABNORMAL HIGH (ref 0–99)
NONHDL: 175.6
Triglycerides: 204 mg/dL — ABNORMAL HIGH (ref 0.0–149.0)
VLDL: 40.8 mg/dL — ABNORMAL HIGH (ref 0.0–40.0)

## 2013-07-06 ENCOUNTER — Other Ambulatory Visit (HOSPITAL_COMMUNITY)
Admission: RE | Admit: 2013-07-06 | Discharge: 2013-07-06 | Disposition: A | Discharge status: Home / Self Care | Payer: BC Managed Care – PPO | Source: Ambulatory Visit | Attending: Family Medicine | Admitting: Family Medicine

## 2013-07-06 ENCOUNTER — Encounter: Payer: Self-pay | Admitting: Family Medicine

## 2013-07-06 ENCOUNTER — Ambulatory Visit (INDEPENDENT_AMBULATORY_CARE_PROVIDER_SITE_OTHER): Payer: BC Managed Care – PPO | Admitting: Family Medicine

## 2013-07-06 VITALS — BP 130/80 | HR 78 | Temp 98.2°F | Wt 182.0 lb

## 2013-07-06 DIAGNOSIS — E669 Obesity, unspecified: Secondary | ICD-10-CM | POA: Insufficient documentation

## 2013-07-06 DIAGNOSIS — E038 Other specified hypothyroidism: Secondary | ICD-10-CM

## 2013-07-06 DIAGNOSIS — M778 Other enthesopathies, not elsewhere classified: Secondary | ICD-10-CM

## 2013-07-06 DIAGNOSIS — Z01419 Encounter for gynecological examination (general) (routine) without abnormal findings: Secondary | ICD-10-CM | POA: Insufficient documentation

## 2013-07-06 DIAGNOSIS — B351 Tinea unguium: Secondary | ICD-10-CM | POA: Insufficient documentation

## 2013-07-06 DIAGNOSIS — Z0000 Encounter for general adult medical examination without abnormal findings: Secondary | ICD-10-CM

## 2013-07-06 DIAGNOSIS — M65849 Other synovitis and tenosynovitis, unspecified hand: Secondary | ICD-10-CM

## 2013-07-06 DIAGNOSIS — M65839 Other synovitis and tenosynovitis, unspecified forearm: Secondary | ICD-10-CM

## 2013-07-06 DIAGNOSIS — R319 Hematuria, unspecified: Secondary | ICD-10-CM

## 2013-07-06 LAB — POCT URINALYSIS DIPSTICK
BILIRUBIN UA: NEGATIVE
Glucose, UA: NEGATIVE
Ketones, UA: NEGATIVE
NITRITE UA: NEGATIVE
Protein, UA: NEGATIVE
Spec Grav, UA: 1.02
UROBILINOGEN UA: 0.2
pH, UA: 5

## 2013-07-06 MED ORDER — TERBINAFINE HCL 250 MG PO TABS: 250.0000 mg | ORAL_TABLET | Freq: Every day | ORAL | Status: DC

## 2013-07-06 MED ORDER — LEVOTHYROXINE SODIUM 75 MCG PO TABS: 75.0000 ug | ORAL_TABLET | Freq: Every day | ORAL | Status: DC

## 2013-07-06 MED ORDER — TRAMADOL HCL 50 MG PO TABS: 50.0000 mg | ORAL_TABLET | Freq: Four times a day (QID) | ORAL | Status: DC | PRN

## 2013-07-06 NOTE — Progress Notes (Signed)
Pre visit review using our clinic review tool, if applicable. No additional management support is needed unless otherwise documented below in the visit note. 

## 2013-07-06 NOTE — Patient Instructions (Signed)
Set up repeat mammogram Will call you regarding Pap smear results Start Lamisil 250 mg once daily Consider thumb spica splint for left wrist Schedule followup for 3 months to reassess Establish consistent exercise program

## 2013-07-06 NOTE — Progress Notes (Signed)
Subjective:    Patient ID: Natasha Fitzgerald, female    DOB: 12/13/1965, 48 y.o.   MRN: [id number]  HPI Patient here for complete physical. She has several other issues as below as well. She thinks she is menopausal. She only had about 4 menstrual bleeds all last year and has not had any since December. She has frequent hot flushes since then. She is requesting repeat Pap smear.  These have been normal over the past several years. She had remote history of abnormality several years ago. Nonsmoker. She's had some gradual weight gain is upset regarding that. She's not exercising consistently. She plans to set up mammogram soon. Tetanus up-to-date.  Hypothyroidism. Compliant with therapy. TSH with labs slightly elevated. Does have some fatigue issues  Brittle thickened nail changes great toes bilaterally. Present for several years. She would like to explore treatment options. No history of hepatic dysfunction  Left wrist pain. Mostly involving abductor extensor tendon left thumb. No specific injury. She has used some ice with minimal relief.  Past Medical History  Diagnosis Date  . DEPRESSION 09/28/2009  . HYPERTENSION 09/28/2009  . IRREGULAR MENSES 09/28/2009  . Chronic headaches 09/28/2009  . Fibromyalgia   . Anxiety   . History of kidney stones   . RUQ pain    Past Surgical History  Procedure Laterality Date  . Cesarean section  1997  . Tonsillectomy  1973    reports that she has never smoked. She has never used smokeless tobacco. She reports that she drinks alcohol. She reports that she does not use illicit drugs. family history includes Breast cancer in her maternal grandmother; Heart disease in her maternal grandfather and paternal grandfather; Hypertension in her mother. There is no history of Colon cancer. Allergies  Allergen Reactions  . Penicillins     REACTION: hives      Review of Systems  Constitutional: Positive for fatigue and unexpected weight change. Negative for  fever, activity change and appetite change.  HENT: Negative for ear pain, hearing loss, sore throat and trouble swallowing.   Eyes: Negative for visual disturbance.  Respiratory: Negative for cough and shortness of breath.   Cardiovascular: Negative for chest pain and palpitations.  Gastrointestinal: Negative for abdominal pain, diarrhea, constipation and blood in stool.  Genitourinary: Negative for dysuria and hematuria.  Musculoskeletal: Negative for arthralgias, back pain and myalgias.  Skin: Negative for rash.  Neurological: Negative for dizziness, syncope and headaches.  Hematological: Negative for adenopathy.  Psychiatric/Behavioral: Negative for confusion and dysphoric mood.       Objective:   Physical Exam  Constitutional: She is oriented to person, place, and time. She appears well-developed and well-nourished.  HENT:  Head: Normocephalic and atraumatic.  Eyes: EOM are normal. Pupils are equal, round, and reactive to light.  Neck: Normal range of motion. Neck supple. No thyromegaly present.  Cardiovascular: Normal rate, regular rhythm and normal heart sounds.   No murmur heard. Pulmonary/Chest: Breath sounds normal. No respiratory distress. She has no wheezes. She has no rales.  Abdominal: Soft. Bowel sounds are normal. She exhibits no distension and no mass. There is no tenderness. There is no rebound and no guarding.  Genitourinary:  Breasts are symmetric with no mass. No nipple inversion. No skin dimpling. Pelvic exam normal external genitalia. Pap smear obtained. Bimanual no adnexal masses and no tenderness.  Musculoskeletal: Normal range of motion. She exhibits no edema.  Left wrist reveals full range of motion. She has some tenderness over the extensor tendon of  the left thumb. No weakness  Lymphadenopathy:    She has no cervical adenopathy.  Neurological: She is alert and oriented to person, place, and time. She displays normal reflexes. No cranial nerve deficit.    Skin: No rash noted.  Patient is brittle nail changes great toes bilaterally. These are thickened and brittle  Psychiatric: She has a normal mood and affect. Her behavior is normal. Judgment and thought content normal.          Assessment & Plan:  #1 complete physical. Schedule mammogram. Pap smear obtained. Labs reviewed.  Discussed need for weight loss. #2 hypothyroidism. TSH elevated. Titrate levothyroxin to 75 mcg daily and repeat TSH in 3-4 months #3 probable onychomycosis. Hepatic function normal. Lamisil 250 mg once daily for 3 months #4 left wrist pain. Suspect tendinitis.  thumb spica splint for as needed use. Continue icing

## 2013-07-07 ENCOUNTER — Other Ambulatory Visit: Payer: Self-pay

## 2013-07-07 LAB — CYTOLOGY - PAP

## 2013-07-07 MED ORDER — LOSARTAN POTASSIUM 50 MG PO TABS
ORAL_TABLET | ORAL | Status: DC
Start: 2013-07-07 — End: 2014-02-15

## 2013-07-08 ENCOUNTER — Encounter: Payer: Self-pay | Admitting: Family Medicine

## 2013-09-08 ENCOUNTER — Ambulatory Visit (INDEPENDENT_AMBULATORY_CARE_PROVIDER_SITE_OTHER): Payer: BC Managed Care – PPO | Admitting: Family Medicine

## 2013-09-08 DIAGNOSIS — Z111 Encounter for screening for respiratory tuberculosis: Secondary | ICD-10-CM

## 2013-09-10 LAB — TB SKIN TEST: TB Skin Test: NEGATIVE

## 2013-10-06 ENCOUNTER — Ambulatory Visit: Payer: BC Managed Care – PPO | Admitting: Family Medicine

## 2013-10-13 ENCOUNTER — Ambulatory Visit
Admission: RE | Admit: 2013-10-13 | Discharge: 2013-10-13 | Disposition: A | Discharge status: Home / Self Care | Payer: BC Managed Care – PPO | Source: Ambulatory Visit

## 2013-10-13 DIAGNOSIS — Z1231 Encounter for screening mammogram for malignant neoplasm of breast: Secondary | ICD-10-CM

## 2014-02-12 ENCOUNTER — Encounter: Payer: Self-pay | Admitting: Family Medicine

## 2014-02-12 ENCOUNTER — Ambulatory Visit (INDEPENDENT_AMBULATORY_CARE_PROVIDER_SITE_OTHER): Payer: BLUE CROSS/BLUE SHIELD | Admitting: Family Medicine

## 2014-02-12 VITALS — BP 124/80 | HR 91 | Temp 97.9°F | Wt 183.0 lb

## 2014-02-12 DIAGNOSIS — E038 Other specified hypothyroidism: Secondary | ICD-10-CM | POA: Diagnosis not present

## 2014-02-12 DIAGNOSIS — M159 Polyosteoarthritis, unspecified: Secondary | ICD-10-CM

## 2014-02-12 DIAGNOSIS — M67432 Ganglion, left wrist: Secondary | ICD-10-CM

## 2014-02-12 DIAGNOSIS — I10 Essential (primary) hypertension: Secondary | ICD-10-CM

## 2014-02-12 DIAGNOSIS — R635 Abnormal weight gain: Secondary | ICD-10-CM | POA: Diagnosis not present

## 2014-02-12 LAB — TSH: TSH: 4.9 u[IU]/mL — ABNORMAL HIGH (ref 0.35–4.50)

## 2014-02-12 MED ORDER — MELOXICAM 15 MG PO TABS: 15.0000 mg | ORAL_TABLET | Freq: Every day | ORAL | Status: DC | PRN

## 2014-02-12 MED ORDER — TRAMADOL HCL 50 MG PO TABS: 50.0000 mg | ORAL_TABLET | Freq: Four times a day (QID) | ORAL | Status: DC | PRN

## 2014-02-12 NOTE — Patient Instructions (Signed)

## 2014-02-12 NOTE — Progress Notes (Signed)
Pre visit review using our clinic review tool, if applicable. No additional management support is needed unless otherwise documented below in the visit note. 

## 2014-02-12 NOTE — Progress Notes (Signed)
   Subjective:    Patient ID: Natasha Fitzgerald, female    DOB: 1965/02/15, 49 y.o.   MRN: [id number]  HPI Patient seen for several issues as follows:  Small "knot" on left wrist. Radial aspect. Noted several weeks ago. Nonpainful. Not growing in size. No history of injury.  Progressive right knee pains. No injury. Has some left knee pain as well. Saw orthopedist within past year. X-rays reportedly showed moderate degenerative changes. No recent swelling or warmth. No locking or giving way. Has taken both Modic and tramadol in the past which helped and requesting refills.  Hypothyroidism. She had adjustment in thyroid dose and needs follow-up TSH. She's had some weight gain over past year. No consistent exercise. No cold intolerance or consistent constipation or other overt symptoms of hypothyroidism. Compliant with therapy.  Hypertension treated with losartan. No headaches. No dizziness.  Past Medical History  Diagnosis Date  . DEPRESSION 09/28/2009  . HYPERTENSION 09/28/2009  . IRREGULAR MENSES 09/28/2009  . Chronic headaches 09/28/2009  . Fibromyalgia   . Anxiety   . History of kidney stones   . RUQ pain    Past Surgical History  Procedure Laterality Date  . Cesarean section  1997  . Tonsillectomy  1973    reports that she has never smoked. She has never used smokeless tobacco. She reports that she drinks alcohol. She reports that she does not use illicit drugs. family history includes Breast cancer in her maternal grandmother; Heart disease in her maternal grandfather and paternal grandfather; Hypertension in her mother. There is no history of Colon cancer. Allergies  Allergen Reactions  . Penicillins     REACTION: hives      Review of Systems  Constitutional: Positive for unexpected weight change. Negative for fatigue.  Eyes: Negative for visual disturbance.  Respiratory: Negative for cough, chest tightness, shortness of breath and wheezing.   Cardiovascular: Negative for  chest pain, palpitations and leg swelling.  Musculoskeletal: Positive for arthralgias.  Neurological: Negative for dizziness, seizures, syncope, weakness, light-headedness and headaches.       Objective:   Physical Exam  Constitutional: She appears well-developed and well-nourished. No distress.  Neck: Neck supple. No thyromegaly present.  Cardiovascular: Normal rate and regular rhythm.   Pulmonary/Chest: Effort normal and breath sounds normal. No respiratory distress. She has no wheezes. She has no rales.  Musculoskeletal: She exhibits no edema.  Left wrist reveals mobile nontender cystic structure over the distal radius-approximately 1/2 cm diameter. Full range of motion left wrist. No bony tenderness  Right knee reveals full range of motion. No effusion. No warmth. Minimal medial joint line tenderness  Skin: No rash noted.          Assessment & Plan:  #1 benign ganglion cyst left wrist. Reassurance. Follow-up for any pain or rapid growth #2 osteoarthritis involving multiple joints, especially knees. Refill Mobic and tramadol for as needed use #3 hypothyroidism. Recheck TSH #4 weight gain. We discussed strategies for weight loss. Scale back sugar intake as she currently drinks several soft drinks per day. Increase physical activity #5 hypertension stable and at goal

## 2014-02-15 ENCOUNTER — Other Ambulatory Visit: Payer: Self-pay | Admitting: Family Medicine

## 2014-02-15 ENCOUNTER — Telehealth: Payer: Self-pay

## 2014-02-15 ENCOUNTER — Other Ambulatory Visit: Payer: Self-pay

## 2014-02-15 DIAGNOSIS — E038 Other specified hypothyroidism: Secondary | ICD-10-CM

## 2014-02-15 MED ORDER — LOSARTAN POTASSIUM 50 MG PO TABS: ORAL_TABLET | ORAL | Status: DC

## 2014-02-15 MED ORDER — LEVOTHYROXINE SODIUM 88 MCG PO TABS: 88.0000 ug | ORAL_TABLET | Freq: Every day | ORAL | Status: DC

## 2014-02-15 NOTE — Telephone Encounter (Signed)
Rx sent to mail order

## 2014-02-15 NOTE — Telephone Encounter (Signed)
[facility] refill request for LOSARTAN TAB 50MG  #90 with 4 refills

## 2014-02-19 ENCOUNTER — Telehealth: Payer: Self-pay | Admitting: Family Medicine

## 2014-02-19 MED ORDER — LEVOTHYROXINE SODIUM 88 MCG PO TABS: 88.0000 ug | ORAL_TABLET | Freq: Every day | ORAL | Status: DC

## 2014-02-19 NOTE — Telephone Encounter (Signed)
[facility] requesting approval to refill levothyroxine (SYNTHROID, LEVOTHROID) 88 MCG tablet #90 w/ 4 refills.

## 2014-02-19 NOTE — Telephone Encounter (Signed)
Rx sent to pharmacy   

## 2014-05-22 ENCOUNTER — Other Ambulatory Visit: Payer: Self-pay | Admitting: Family Medicine

## 2015-01-02 ENCOUNTER — Other Ambulatory Visit: Payer: Self-pay | Admitting: Family Medicine

## 2015-01-13 ENCOUNTER — Other Ambulatory Visit: Payer: Self-pay | Admitting: Family Medicine

## 2015-01-21 ENCOUNTER — Other Ambulatory Visit: Payer: Self-pay | Admitting: Family Medicine

## 2015-01-21 MED ORDER — SERTRALINE HCL 100 MG PO TABS: ORAL_TABLET | ORAL | Status: DC

## 2015-01-28 LAB — LIPID PANEL
Cholesterol: 259 mg/dL — AB (ref 0–200)
HDL: 47 mg/dL (ref 35–70)
LDL CALC: 187 mg/dL
TRIGLYCERIDES: 126 mg/dL (ref 40–160)

## 2015-02-02 ENCOUNTER — Other Ambulatory Visit: Payer: Self-pay | Admitting: Family Medicine

## 2015-03-18 ENCOUNTER — Encounter: Payer: Self-pay | Admitting: Family Medicine

## 2015-03-18 ENCOUNTER — Ambulatory Visit (INDEPENDENT_AMBULATORY_CARE_PROVIDER_SITE_OTHER): Payer: BLUE CROSS/BLUE SHIELD | Admitting: Family Medicine

## 2015-03-18 VITALS — BP 118/90 | HR 72 | Temp 97.8°F | Ht 63.0 in | Wt 181.4 lb

## 2015-03-18 DIAGNOSIS — I10 Essential (primary) hypertension: Secondary | ICD-10-CM

## 2015-03-18 DIAGNOSIS — E038 Other specified hypothyroidism: Secondary | ICD-10-CM

## 2015-03-18 DIAGNOSIS — M150 Primary generalized (osteo)arthritis: Secondary | ICD-10-CM

## 2015-03-18 DIAGNOSIS — Z8659 Personal history of other mental and behavioral disorders: Secondary | ICD-10-CM

## 2015-03-18 DIAGNOSIS — M8949 Other hypertrophic osteoarthropathy, multiple sites: Secondary | ICD-10-CM

## 2015-03-18 DIAGNOSIS — M159 Polyosteoarthritis, unspecified: Secondary | ICD-10-CM

## 2015-03-18 LAB — BASIC METABOLIC PANEL
BUN: 14 mg/dL (ref 6–23)
CHLORIDE: 101 meq/L (ref 96–112)
CO2: 29 meq/L (ref 19–32)
Calcium: 10.2 mg/dL (ref 8.4–10.5)
Creatinine, Ser: 0.73 mg/dL (ref 0.40–1.20)
GFR: 89.57 mL/min (ref >60.00)
GLUCOSE: 87 mg/dL (ref 70–99)
POTASSIUM: 3.9 meq/L (ref 3.5–5.1)
SODIUM: 140 meq/L (ref 135–145)

## 2015-03-18 LAB — TSH: TSH: 3.49 u[IU]/mL (ref 0.35–4.50)

## 2015-03-18 MED ORDER — LOSARTAN POTASSIUM 50 MG PO TABS: ORAL_TABLET | ORAL | Status: DC

## 2015-03-18 MED ORDER — MELOXICAM 15 MG PO TABS: 15.0000 mg | ORAL_TABLET | Freq: Every day | ORAL | Status: DC | PRN

## 2015-03-18 MED ORDER — TRAMADOL HCL 50 MG PO TABS: 50.0000 mg | ORAL_TABLET | Freq: Four times a day (QID) | ORAL | Status: DC | PRN

## 2015-03-18 NOTE — Progress Notes (Signed)
Pre visit review using our clinic review tool, if applicable. No additional management support is needed unless otherwise documented below in the visit note. 

## 2015-03-18 NOTE — Progress Notes (Signed)
   Subjective:    Patient ID: Natasha Fitzgerald, female    DOB: [birth year], 50 y.o.   MRN: [id number]  HPI  Patient seen for follow-up regarding multiple medical problems. She has history of obesity, hypothyroidism, recurrent depression, osteoarthritis, dyslipidemia, hypertension. Medications reviewed. Compliant with all. Requesting refills of losartan. She has not had lab work in over a year. We increased her levothyroxine last year to 88 g daily. Does not get any consistent exercise. Has not had screening colonoscopy yet.  She has arthritis mostly involving both knees. She takes meloxicam and as needed tramadol and requesting refills of both.  Past Medical History  Diagnosis Date  . DEPRESSION 09/28/2009  . HYPERTENSION 09/28/2009  . IRREGULAR MENSES 09/28/2009  . Chronic headaches 09/28/2009  . Fibromyalgia   . Anxiety   . History of kidney stones   . RUQ pain    Past Surgical History  Procedure Laterality Date  . Cesarean section  1997  . Tonsillectomy  1973    reports that she has never smoked. She has never used smokeless tobacco. She reports that she drinks alcohol. She reports that she does not use illicit drugs. family history includes Breast cancer in her maternal grandmother; Heart disease in her maternal grandfather and paternal grandfather; Hypertension in her mother. There is no history of Colon cancer. Allergies  Allergen Reactions  . Penicillins     REACTION: hives      Review of Systems  Constitutional: Negative for fatigue.  Eyes: Negative for visual disturbance.  Respiratory: Negative for cough, chest tightness, shortness of breath and wheezing.   Cardiovascular: Negative for chest pain, palpitations and leg swelling.  Gastrointestinal: Negative for abdominal pain.  Genitourinary: Negative for dysuria.  Musculoskeletal: Positive for arthralgias.  Neurological: Negative for dizziness, seizures, syncope, weakness, light-headedness and headaches.         Objective:   Physical Exam  Constitutional: She appears well-developed and well-nourished.  Eyes: Pupils are equal, round, and reactive to light.  Neck: Neck supple. No JVD present. No thyromegaly present.  Cardiovascular: Normal rate and regular rhythm.  Exam reveals no gallop.   Pulmonary/Chest: Effort normal and breath sounds normal. No respiratory distress. She has no wheezes. She has no rales.  Musculoskeletal: She exhibits no edema.  Neurological: She is alert.          Assessment & Plan:   #1 hypertension. Stable. Refill losartan for one year   #2 dyslipidemia. Patient states she had recent lipids done through her husband's work. She'll try to get a copy of that  #3 hypothyroidism. Recheck TSH   #4 osteoarthritis mostly involving knees. Refill meloxicam and tramadol   #5 health maintenance. We recommended complete physical. She declines colonoscopy at this point.

## 2015-03-20 ENCOUNTER — Encounter: Payer: Self-pay | Admitting: Family Medicine

## 2015-03-21 NOTE — Telephone Encounter (Signed)
Do you see an attached document? Im either not seeing this or dont know where to look?

## 2015-04-05 ENCOUNTER — Encounter: Payer: Self-pay | Admitting: Family Medicine

## 2015-04-05 NOTE — Telephone Encounter (Signed)
Abstracted in chart °

## 2015-04-13 ENCOUNTER — Other Ambulatory Visit: Payer: Self-pay | Admitting: Family Medicine

## 2015-04-16 ENCOUNTER — Encounter: Payer: Self-pay | Admitting: Family Medicine

## 2015-04-18 ENCOUNTER — Other Ambulatory Visit: Payer: Self-pay | Admitting: Family Medicine

## 2015-04-18 MED ORDER — LEVOTHYROXINE SODIUM 88 MCG PO TABS: 88.0000 ug | ORAL_TABLET | Freq: Every day | ORAL | Status: DC

## 2015-05-13 ENCOUNTER — Ambulatory Visit (INDEPENDENT_AMBULATORY_CARE_PROVIDER_SITE_OTHER): Payer: BLUE CROSS/BLUE SHIELD | Admitting: Family Medicine

## 2015-05-13 VITALS — BP 130/100 | HR 86 | Temp 97.8°F | Ht 63.0 in | Wt 183.4 lb

## 2015-05-13 DIAGNOSIS — L03032 Cellulitis of left toe: Secondary | ICD-10-CM | POA: Diagnosis not present

## 2015-05-13 MED ORDER — DOXYCYCLINE HYCLATE 100 MG PO CAPS: 100.0000 mg | ORAL_CAPSULE | Freq: Two times a day (BID) | ORAL | Status: DC

## 2015-05-13 NOTE — Progress Notes (Signed)
   Subjective:    Patient ID: Natasha Fitzgerald, female    DOB: 1965-08-17, 50 y.o.   MRN: [id number]  HPI Patient seen with left great toe pain Onset about 3 weeks ago. No injury. Possible ingrown nail. She noticed some erythema, swelling, and drainage past several days She's been soaking in salt water without much improvement. No fever or chills. Able to ambulate. No prior history of ingrown toenail. No history of diabetes.  Past Medical History  Diagnosis Date  . DEPRESSION 09/28/2009  . HYPERTENSION 09/28/2009  . IRREGULAR MENSES 09/28/2009  . Chronic headaches 09/28/2009  . Fibromyalgia   . Anxiety   . History of kidney stones   . RUQ pain    Past Surgical History  Procedure Laterality Date  . Cesarean section  1997  . Tonsillectomy  1973    reports that she has never smoked. She has never used smokeless tobacco. She reports that she drinks alcohol. She reports that she does not use illicit drugs. family history includes Breast cancer in her maternal grandmother; Heart disease in her maternal grandfather and paternal grandfather; Hypertension in her mother. There is no history of Colon cancer. Allergies  Allergen Reactions  . Penicillins     REACTION: hives      Review of Systems  Constitutional: Negative for fever and chills.       Objective:   Physical Exam  Constitutional: She appears well-developed and well-nourished. No distress.  Cardiovascular: Normal rate and regular rhythm.   Skin:  Left great toe along the medial border reveals some erythema, tenderness, mild swelling. She has some minimal purulent secretions along the medial border. No granulation tissue          Assessment & Plan:  Cellulitis left great toe with possible ingrown medial border. No evidence or paronychia. Continue warm saltwater soaks 2-3 times daily. Doxycycline 100 mg twice a day for 10 days. If not improved in 2 weeks consider excision of involved third border of the nail  Eulas Post MD [facility] at [facility]

## 2015-05-13 NOTE — Patient Instructions (Signed)
Continue warm salt water soaks at least twice daily Follow up if not much better in two weeks after antibiotic.

## 2015-05-13 NOTE — Progress Notes (Signed)
Pre visit review using our clinic review tool, if applicable. No additional management support is needed unless otherwise documented below in the visit note. 

## 2015-05-17 ENCOUNTER — Encounter: Payer: Self-pay | Admitting: Family Medicine

## 2015-05-18 ENCOUNTER — Other Ambulatory Visit: Payer: Self-pay | Admitting: Family Medicine

## 2015-05-18 MED ORDER — AZITHROMYCIN 250 MG PO TABS: ORAL_TABLET | ORAL | Status: DC

## 2015-05-18 NOTE — Telephone Encounter (Signed)
Seen on 5/12, Dx with cellulitis of the Lt great toe. I can place this on her allergy list---any other options?

## 2015-06-24 ENCOUNTER — Other Ambulatory Visit: Payer: Self-pay | Admitting: Family Medicine

## 2015-09-13 ENCOUNTER — Ambulatory Visit (INDEPENDENT_AMBULATORY_CARE_PROVIDER_SITE_OTHER): Payer: BLUE CROSS/BLUE SHIELD | Admitting: Family Medicine

## 2015-09-13 VITALS — BP 130/90 | HR 88 | Temp 98.1°F | Ht 63.0 in | Wt 183.5 lb

## 2015-09-13 DIAGNOSIS — Z62810 Personal history of physical and sexual abuse in childhood: Secondary | ICD-10-CM | POA: Diagnosis not present

## 2015-09-13 DIAGNOSIS — F411 Generalized anxiety disorder: Secondary | ICD-10-CM

## 2015-09-13 DIAGNOSIS — Z8659 Personal history of other mental and behavioral disorders: Secondary | ICD-10-CM

## 2015-09-13 MED ORDER — SERTRALINE HCL 100 MG PO TABS: ORAL_TABLET | ORAL | 3 refills | Status: DC

## 2015-09-13 NOTE — Progress Notes (Signed)
Pre visit review using our clinic review tool, if applicable. No additional management support is needed unless otherwise documented below in the visit note. 

## 2015-09-13 NOTE — Patient Instructions (Signed)
Go ahead and increase the Sertraline to one and one half tablets daily Try to take the Ultram infrequently.
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
## 2015-12-29 ENCOUNTER — Encounter: Payer: Self-pay | Admitting: Family Medicine

## 2015-12-29 ENCOUNTER — Other Ambulatory Visit: Payer: Self-pay | Admitting: Emergency Medicine

## 2015-12-29 MED ORDER — TRAMADOL HCL 50 MG PO TABS: 50.0000 mg | ORAL_TABLET | Freq: Four times a day (QID) | ORAL | 0 refills | Status: DC | PRN

## 2016-02-02 ENCOUNTER — Other Ambulatory Visit: Payer: Self-pay | Admitting: Family Medicine

## 2016-02-02 DIAGNOSIS — Z1231 Encounter for screening mammogram for malignant neoplasm of breast: Secondary | ICD-10-CM

## 2016-02-16 ENCOUNTER — Ambulatory Visit
Admission: RE | Admit: 2016-02-16 | Discharge: 2016-02-16 | Disposition: A | Discharge status: Home / Self Care | Payer: BLUE CROSS/BLUE SHIELD | Source: Ambulatory Visit | Attending: Family Medicine | Admitting: Family Medicine

## 2016-02-16 DIAGNOSIS — Z1231 Encounter for screening mammogram for malignant neoplasm of breast: Secondary | ICD-10-CM

## 2016-02-23 ENCOUNTER — Encounter: Payer: Self-pay | Admitting: Family Medicine

## 2016-02-24 ENCOUNTER — Other Ambulatory Visit: Payer: Self-pay

## 2016-02-24 MED ORDER — LOSARTAN POTASSIUM 50 MG PO TABS: 50.0000 mg | ORAL_TABLET | Freq: Every day | ORAL | 2 refills | Status: DC

## 2016-02-27 ENCOUNTER — Other Ambulatory Visit: Payer: Self-pay

## 2016-02-27 MED ORDER — SERTRALINE HCL 100 MG PO TABS: ORAL_TABLET | ORAL | 3 refills | Status: DC

## 2016-03-24 ENCOUNTER — Encounter: Payer: Self-pay | Admitting: Family Medicine

## 2016-03-26 MED ORDER — LEVOTHYROXINE SODIUM 88 MCG PO TABS: 88.0000 ug | ORAL_TABLET | Freq: Every day | ORAL | 1 refills | Status: DC

## 2016-04-21 ENCOUNTER — Encounter: Payer: Self-pay | Admitting: Family Medicine

## 2016-07-02 ENCOUNTER — Ambulatory Visit (INDEPENDENT_AMBULATORY_CARE_PROVIDER_SITE_OTHER): Payer: BLUE CROSS/BLUE SHIELD | Admitting: Psychology

## 2016-07-02 DIAGNOSIS — F411 Generalized anxiety disorder: Secondary | ICD-10-CM

## 2016-07-02 DIAGNOSIS — F424 Excoriation (skin-picking) disorder: Secondary | ICD-10-CM | POA: Diagnosis not present

## 2016-08-06 ENCOUNTER — Ambulatory Visit (INDEPENDENT_AMBULATORY_CARE_PROVIDER_SITE_OTHER): Payer: BLUE CROSS/BLUE SHIELD | Admitting: Psychology

## 2016-08-06 DIAGNOSIS — F411 Generalized anxiety disorder: Secondary | ICD-10-CM | POA: Diagnosis not present

## 2016-08-19 ENCOUNTER — Other Ambulatory Visit: Payer: Self-pay | Admitting: Family Medicine

## 2016-08-27 ENCOUNTER — Ambulatory Visit: Payer: BLUE CROSS/BLUE SHIELD | Admitting: Psychology

## 2016-09-20 ENCOUNTER — Encounter: Payer: Self-pay | Admitting: Family Medicine

## 2016-10-10 ENCOUNTER — Encounter: Payer: Self-pay | Admitting: Family Medicine

## 2016-10-10 ENCOUNTER — Ambulatory Visit (INDEPENDENT_AMBULATORY_CARE_PROVIDER_SITE_OTHER): Payer: BC Managed Care – PPO | Admitting: Family Medicine

## 2016-10-10 VITALS — BP 124/70 | HR 107 | Temp 98.7°F | Resp 16 | Ht 63.0 in | Wt 178.0 lb

## 2016-10-10 DIAGNOSIS — G5712 Meralgia paresthetica, left lower limb: Secondary | ICD-10-CM

## 2016-10-10 DIAGNOSIS — I10 Essential (primary) hypertension: Secondary | ICD-10-CM

## 2016-10-10 DIAGNOSIS — Z8659 Personal history of other mental and behavioral disorders: Secondary | ICD-10-CM | POA: Diagnosis not present

## 2016-10-10 DIAGNOSIS — Z23 Encounter for immunization: Secondary | ICD-10-CM

## 2016-10-10 DIAGNOSIS — E038 Other specified hypothyroidism: Secondary | ICD-10-CM

## 2016-10-10 NOTE — Progress Notes (Signed)
Subjective:     Patient ID: Natasha Fitzgerald, female   DOB: Nov 06, 1965, 51 y.o.   MRN: [id number]  HPI Patient here to discuss several items as follows  Hypothyroidism on levothyroxine. Was over a year ago since she had last thyroid check. Compliant with therapy.  Past history of depression and anxiety. She's been on sertraline for over 7 years. Had been on dose of 150 mg and she has basically tapered herself off to 50 mg currently. Her mood seems to be stable. She felt like she had less initiative when taking higher dose. She hopes to taper off completely. No breakthrough depression symptoms. Anxiety symptoms are stable.  Patient relates almost 1 year history of intermittent numbness involving the left anterior and lateral thigh. Occasional mild pain. Denies lumbar back pain. No weakness. No urine or stool incontinence. No clear exacerbating factors. No hip pain.  Past Medical History:  Diagnosis Date  . Anxiety   . Chronic headaches 09/28/2009  . DEPRESSION 09/28/2009  . Fibromyalgia   . History of kidney stones   . HYPERTENSION 09/28/2009  . IRREGULAR MENSES 09/28/2009  . RUQ pain    Past Surgical History:  Procedure Laterality Date  . CESAREAN SECTION  1997  . TONSILLECTOMY  1973    reports that she has never smoked. She has never used smokeless tobacco. She reports that she drinks alcohol. She reports that she does not use drugs. family history includes Breast cancer in her maternal grandmother; Heart disease in her maternal grandfather and paternal grandfather; Hypertension in her mother. Allergies  Allergen Reactions  . Penicillins     REACTION: hives     Review of Systems  Constitutional: Negative for appetite change, fatigue and unexpected weight change.  Eyes: Negative for visual disturbance.  Respiratory: Negative for cough, chest tightness, shortness of breath and wheezing.   Cardiovascular: Negative for chest pain, palpitations and leg swelling.  Endocrine: Negative  for cold intolerance and heat intolerance.  Musculoskeletal: Negative for back pain.  Neurological: Positive for numbness. Negative for dizziness, seizures, syncope, weakness, light-headedness and headaches.       Objective:   Physical Exam  Constitutional: She appears well-developed and well-nourished.  Cardiovascular: Normal rate and regular rhythm.   Pulmonary/Chest: Effort normal and breath sounds normal. No respiratory distress. She has no wheezes. She has no rales.  Musculoskeletal: She exhibits no edema.  No edema lower extremities. Full range of motion left hip. Both feet are warm to touch with good capillary refill and good distal pulses.  Neurological:  Full strength lower extremities. She has symmetric reflexes knee and ankle bilaterally Mild subjective sensory impairment touch left lateral thigh in anterior thigh compared to the right. 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 Currently on 50 mg and taper further 25 mg for couple weeks and then discontinue -Discussed issues regarding her sensory issues left thigh. She has no weakness or other red flags. We explained this is frequently self-limited. Discussed options if this becomes more painful or she has any new symptoms  Eulas Post MD [facility] at [facility]

## 2016-10-11 ENCOUNTER — Encounter: Payer: Self-pay | Admitting: Family Medicine

## 2016-10-11 LAB — TSH: TSH: 2.92 u[IU]/mL (ref 0.35–4.50)

## 2016-11-18 ENCOUNTER — Other Ambulatory Visit: Payer: Self-pay | Admitting: Family Medicine

## 2016-11-26 ENCOUNTER — Encounter: Payer: Self-pay | Admitting: Family Medicine

## 2016-11-26 ENCOUNTER — Ambulatory Visit: Payer: BC Managed Care – PPO | Admitting: Family Medicine

## 2016-11-26 VITALS — BP 110/80 | HR 91 | Temp 98.2°F | Wt 179.8 lb

## 2016-11-26 DIAGNOSIS — J209 Acute bronchitis, unspecified: Secondary | ICD-10-CM

## 2016-11-26 MED ORDER — HYDROCOD POLST-CPM POLST ER 10-8 MG/5ML PO SUER: 5.0000 mL | Freq: Two times a day (BID) | ORAL | 0 refills | Status: DC | PRN

## 2016-11-26 NOTE — Patient Instructions (Signed)

## 2016-11-26 NOTE — Progress Notes (Signed)
Subjective:     Patient ID: Natasha Fitzgerald, female   DOB: 23-Feb-1965, 51 y.o.   MRN: [id number]  HPI  Patient is nonsmoker seen with cough one-week duration. Intermittent headache. She works as a Pharmacist, hospital. She's had severe cough at times especially at night. She tried Delsym without relief. Also tried a combination drug with decongestant and guaifenesin which did not help much. She's not aware of any fever. No hemoptysis. No obvious wheezing.  Past Medical History:  Diagnosis Date  . Anxiety   . Chronic headaches 09/28/2009  . DEPRESSION 09/28/2009  . Fibromyalgia   . History of kidney stones   . HYPERTENSION 09/28/2009  . IRREGULAR MENSES 09/28/2009  . RUQ pain    Past Surgical History:  Procedure Laterality Date  . CESAREAN SECTION  1997  . TONSILLECTOMY  1973    reports that  has never smoked. she has never used smokeless tobacco. She reports that she drinks alcohol. She reports that she does not use drugs. family history includes Breast cancer in her maternal grandmother; Heart disease in her maternal grandfather and paternal grandfather; Hypertension in her mother. Allergies  Allergen Reactions  . Penicillins     REACTION: hives    Review of Systems  Constitutional: Positive for fatigue. Negative for chills and fever.  HENT: Negative for sinus pressure and sinus pain.   Respiratory: Positive for cough.   Cardiovascular: Negative for chest pain.       Objective:   Physical Exam  Constitutional: She appears well-developed and well-nourished.  HENT:  Right Ear: External ear normal.  Left Ear: External ear normal.  Mouth/Throat: Oropharynx is clear and moist.  Neck: Neck supple.  Cardiovascular: Normal rate and regular rhythm.  Pulmonary/Chest: Effort normal and breath sounds normal. No respiratory distress. She has no wheezes. She has no rales.  Lymphadenopathy:    She has no cervical adenopathy.       Assessment:     Cough. Suspect acute viral bronchitis.  Nonfocal exam.    Plan:     -Tussionex 1 teaspoon daily at bedtime when necessary severe cough -Follow-up promptly for any fever or increasing shortness of breath or other concerns  .Eulas Post MD [facility] at [facility]

## 2016-12-23 ENCOUNTER — Other Ambulatory Visit: Payer: Self-pay | Admitting: Family Medicine

## 2017-01-14 ENCOUNTER — Other Ambulatory Visit: Payer: Self-pay | Admitting: Family Medicine

## 2017-01-14 DIAGNOSIS — Z139 Encounter for screening, unspecified: Secondary | ICD-10-CM

## 2017-02-21 ENCOUNTER — Ambulatory Visit
Admission: RE | Admit: 2017-02-21 | Discharge: 2017-02-21 | Disposition: A | Discharge status: Home / Self Care | Payer: BC Managed Care – PPO | Source: Ambulatory Visit | Attending: Family Medicine | Admitting: Family Medicine

## 2017-02-21 DIAGNOSIS — Z139 Encounter for screening, unspecified: Secondary | ICD-10-CM

## 2017-04-16 ENCOUNTER — Encounter: Payer: Self-pay | Admitting: Family Medicine

## 2017-04-16 ENCOUNTER — Ambulatory Visit: Payer: BC Managed Care – PPO | Admitting: Family Medicine

## 2017-04-16 VITALS — BP 110/70 | HR 83 | Temp 98.3°F | Wt 174.3 lb

## 2017-04-16 DIAGNOSIS — M150 Primary generalized (osteo)arthritis: Secondary | ICD-10-CM | POA: Diagnosis not present

## 2017-04-16 DIAGNOSIS — Z8659 Personal history of other mental and behavioral disorders: Secondary | ICD-10-CM

## 2017-04-16 DIAGNOSIS — R51 Headache: Secondary | ICD-10-CM

## 2017-04-16 DIAGNOSIS — M8949 Other hypertrophic osteoarthropathy, multiple sites: Secondary | ICD-10-CM

## 2017-04-16 DIAGNOSIS — R519 Headache, unspecified: Secondary | ICD-10-CM

## 2017-04-16 DIAGNOSIS — M159 Polyosteoarthritis, unspecified: Secondary | ICD-10-CM

## 2017-04-16 MED ORDER — MELOXICAM 15 MG PO TABS: 15.0000 mg | ORAL_TABLET | Freq: Every day | ORAL | 5 refills | Status: DC | PRN

## 2017-04-16 MED ORDER — SERTRALINE HCL 100 MG PO TABS: ORAL_TABLET | ORAL | 3 refills | Status: DC

## 2017-04-16 MED ORDER — TRAMADOL HCL 50 MG PO TABS: 50.0000 mg | ORAL_TABLET | Freq: Four times a day (QID) | ORAL | 1 refills | Status: DC | PRN

## 2017-04-16 NOTE — Patient Instructions (Signed)
Give me some feedback in 3 weeks if depression and anxiety symptoms not improved with increased dose of Meloxicam.

## 2017-04-16 NOTE — Progress Notes (Signed)
Subjective:     Patient ID: Natasha Fitzgerald, female   DOB: 25-Jun-1965, 52 y.o.   MRN: [id number]  HPI Patient is here for the following issues  History of mixed anxiety and depression. She's been on sertraline for several years. Last December she tried to taper herself off but had increased anxiety and depression symptoms and is currently back on her previous dose of 150 mg daily. 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  She's having some breakthrough anxiety and depression symptoms and is specifically asking if we can titrate her sertraline up to 200 mg. She tried several other medications prior to sertraline fills sertraline is working the best. No suicidal ideation. Hypothyroidism which is on replacement with normal TSH back in October  Primary osteoarthritis involving multiple joints. She takes meloxicam but not daily. Requesting refills  History of reported "migraine" headaches. Infrequently is taken Ultram in the past. Requesting refills. She tends to have some bilateral headaches related to muscle tension and question tension-type headache. Muscle relaxers have helped occasionally in the past  Past Medical History:  Diagnosis Date  . Anxiety   . Chronic headaches 09/28/2009  . DEPRESSION 09/28/2009  . Fibromyalgia   . History of kidney stones   . HYPERTENSION 09/28/2009  . IRREGULAR MENSES 09/28/2009  . RUQ pain    Past Surgical History:  Procedure Laterality Date  . CESAREAN SECTION  1997  . TONSILLECTOMY  1973    reports that she has never smoked. She has never used smokeless tobacco. She reports that she drinks alcohol. She reports that she does not use drugs. family history includes Breast cancer in her maternal grandmother; Heart disease in her maternal grandfather and paternal grandfather; Hypertension in her mother. Allergies  Allergen Reactions  . Penicillins     REACTION: hives     Review of Systems  Constitutional: Negative for  unexpected weight change.  Eyes: Negative for visual disturbance.  Respiratory: Negative for cough, chest tightness, shortness of breath and wheezing.   Cardiovascular: Negative for chest pain, palpitations and leg swelling.  Endocrine: Negative for polydipsia and polyuria.  Musculoskeletal: Positive for arthralgias.  Neurological: Positive for headaches. Negative for dizziness, seizures, syncope, weakness and light-headedness.  Psychiatric/Behavioral: Positive for dysphoric mood. The patient is nervous/anxious.        Objective:   Physical Exam  Constitutional: She appears well-developed and well-nourished.  Neck: Neck supple. No thyromegaly present.  Cardiovascular: Normal rate and regular rhythm.  Pulmonary/Chest: Effort normal and breath sounds normal. No respiratory distress. She has no wheezes. She has no rales.  Musculoskeletal: She exhibits no edema.  Lymphadenopathy:    She has no cervical adenopathy.  Psychiatric: She has a normal mood and affect. Her behavior is normal.       Assessment:     #1 history of mixed anxiety and depression.  #2 primary osteoarthritis involving multiple joints  #3 history of intermittent headaches-question episodic tension-type headache    Plan:     -Increase sertraline to 200 mg daily. -Refill meloxicam for as needed use -Refill Ultram which she takes sparingly for severe headache -We have asked that she give [place] some feedback in 3 weeks if not seeing improvement in anxiety and depression symptoms with titration of medication   Eulas Post MD [facility] at [facility]

## 2017-05-09 ENCOUNTER — Ambulatory Visit: Payer: BC Managed Care – PPO | Admitting: Psychology

## 2017-05-12 ENCOUNTER — Other Ambulatory Visit: Payer: Self-pay | Admitting: Family Medicine

## 2017-06-16 ENCOUNTER — Other Ambulatory Visit: Payer: Self-pay | Admitting: Family Medicine

## 2017-07-07 ENCOUNTER — Other Ambulatory Visit: Payer: Self-pay | Admitting: Family Medicine

## 2017-08-08 ENCOUNTER — Other Ambulatory Visit: Payer: Self-pay | Admitting: Family Medicine

## 2017-09-15 ENCOUNTER — Encounter: Payer: Self-pay | Admitting: Family Medicine

## 2017-09-18 ENCOUNTER — Ambulatory Visit: Payer: BC Managed Care – PPO | Admitting: Family Medicine

## 2017-09-18 ENCOUNTER — Other Ambulatory Visit: Payer: Self-pay

## 2017-09-18 ENCOUNTER — Encounter: Payer: Self-pay | Admitting: Family Medicine

## 2017-09-18 VITALS — BP 132/86 | HR 98 | Temp 98.3°F | Resp 17 | Ht 63.0 in | Wt 184.4 lb

## 2017-09-18 DIAGNOSIS — J019 Acute sinusitis, unspecified: Secondary | ICD-10-CM

## 2017-09-18 DIAGNOSIS — R05 Cough: Secondary | ICD-10-CM | POA: Diagnosis not present

## 2017-09-18 DIAGNOSIS — R059 Cough, unspecified: Secondary | ICD-10-CM

## 2017-09-18 MED ORDER — HYDROCOD POLST-CPM POLST ER 10-8 MG/5ML PO SUER: 5.0000 mL | Freq: Two times a day (BID) | ORAL | 0 refills | Status: DC | PRN

## 2017-09-18 NOTE — Patient Instructions (Signed)

## 2017-09-18 NOTE — Progress Notes (Signed)
  Subjective:     Patient ID: Natasha Fitzgerald, female   DOB: [birth year], 52 y.o.   MRN: [id number]  HPI Patient seen with multiple upper respiratory symptoms including bilateral ear pressure,nasal congestion, sneezing, cough, laryngitis, fatigue.  Onset of symptoms about 2 weeks ago. She initially developed some conjunctivitis type symptoms. [facility] and was prescribed Tobrex.  Eye symptoms are improving. She then [facility] days later with progressive sinus congestion symptoms and started on Zithromax and also given promethazine/dextromethorphan cough syrup.  Her cough has been fairly severe at night and interfering with sleep. Not relieved with cough medication above. She has not had any fever. Her laryngitis symptoms are slightly improved today compared to yesterday. She feels she has had some slight improvement since starting antibiotics.  Past Medical History:  Diagnosis Date  . Anxiety   . Chronic headaches 09/28/2009  . DEPRESSION 09/28/2009  . Fibromyalgia   . History of kidney stones   . HYPERTENSION 09/28/2009  . IRREGULAR MENSES 09/28/2009  . RUQ pain    Past Surgical History:  Procedure Laterality Date  . CESAREAN SECTION  1997  . TONSILLECTOMY  1973    reports that she has never smoked. She has never used smokeless tobacco. She reports that she drinks alcohol. She reports that she does not use drugs. family history includes Breast cancer in her maternal grandmother; Heart disease in her maternal grandfather and paternal grandfather; Hypertension in her mother. Allergies  Allergen Reactions  . Doxycycline     "headache, irritability, upset stomach"  . Penicillins     REACTION: hives     Review of Systems  Constitutional: Positive for fatigue. Negative for fever.  HENT: Positive for congestion, sinus pressure, sinus pain and voice change.   Respiratory: Positive for cough. Negative for shortness of breath and wheezing.    Cardiovascular: Negative for chest pain.  Neurological: Positive for headaches.       Objective:   Physical Exam  Constitutional: She appears well-developed and well-nourished.  HENT:  Right Ear: External ear normal.  Left Ear: External ear normal.  Mouth/Throat: Oropharynx is clear and moist. No oropharyngeal exudate.  Neck: Neck supple.  Cardiovascular: Normal rate.  Pulmonary/Chest: Effort normal and breath sounds normal.  Lymphadenopathy:    She has no cervical adenopathy.       Assessment:     Recent acute sinusitis now presenting with fairly severe nighttime cough. No relief with over-the-counter medications    Plan:     -wrote for Limited Tussionex 1 teaspoon daily at bedtime -Would not recommend further antibiotics at this time. Treat conservatively with increased fluids, saline nasal irrigation -Follow-up for any fever or relapsing sinusitis symptoms  Eulas Post MD [place] [facility] at [facility]

## 2017-10-02 ENCOUNTER — Encounter: Payer: Self-pay | Admitting: Family Medicine

## 2017-10-02 ENCOUNTER — Ambulatory Visit (INDEPENDENT_AMBULATORY_CARE_PROVIDER_SITE_OTHER): Payer: BC Managed Care – PPO

## 2017-10-02 ENCOUNTER — Other Ambulatory Visit: Payer: Self-pay

## 2017-10-02 ENCOUNTER — Ambulatory Visit: Payer: BC Managed Care – PPO | Admitting: Family Medicine

## 2017-10-02 VITALS — BP 128/78 | HR 92 | Temp 98.2°F | Ht 63.0 in | Wt 184.8 lb

## 2017-10-02 DIAGNOSIS — R059 Cough, unspecified: Secondary | ICD-10-CM

## 2017-10-02 DIAGNOSIS — J189 Pneumonia, unspecified organism: Secondary | ICD-10-CM

## 2017-10-02 DIAGNOSIS — R05 Cough: Secondary | ICD-10-CM

## 2017-10-02 DIAGNOSIS — J181 Lobar pneumonia, unspecified organism: Secondary | ICD-10-CM | POA: Diagnosis not present

## 2017-10-02 MED ORDER — LEVOFLOXACIN 500 MG PO TABS: 500.0000 mg | ORAL_TABLET | Freq: Every day | ORAL | 0 refills | Status: DC

## 2017-10-02 NOTE — Patient Instructions (Signed)
Follow up for any fever or increased shortness of breath. 

## 2017-10-02 NOTE — Progress Notes (Signed)
  Subjective:     Patient ID: Natasha Fitzgerald, female   DOB: 03-21-1965, 52 y.o.   MRN: [id number]  HPI Patient is non-smoker who was seen with about 1 month history of cough.  She was [facility] in a [facility] and placed on Zithromax.  She was subsequently seen here with severe cough not responding to over-the-counter medications.  She was given limited Tussionex which she has used at night with some success.  Her cough has not gotten any better though overall.  She has had some night sweats but has not taken her temperature.  No dyspnea at rest.  No hemoptysis.  She is having some sinus congestive symptoms and intermittent facial pain and headaches.  Greenish nasal discharge.  She did complete course of Zithromax.  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 Anxiety   . Chronic headaches 09/28/2009  . DEPRESSION 09/28/2009  . Fibromyalgia   . History of kidney stones   . HYPERTENSION 09/28/2009  . IRREGULAR MENSES 09/28/2009  . RUQ pain    Past Surgical History:  Procedure Laterality Date  . CESAREAN SECTION  1997  . TONSILLECTOMY  1973    reports that she has never smoked. She has never used smokeless tobacco. She reports that she drinks alcohol. She reports that she does not use drugs. family history includes Breast cancer in her maternal grandmother; Heart disease in her maternal grandfather and paternal grandfather; Hypertension in her mother. Allergies  Allergen Reactions  . Doxycycline     "headache, irritability, upset stomach"  . Penicillins     REACTION: hives     Review of Systems  Constitutional: Positive for fatigue. Negative for chills and fever.  HENT: Positive for congestion, sinus pressure and sinus pain. Negative for ear pain and facial swelling.   Respiratory: Positive for cough.   Cardiovascular: Negative for chest pain.  Neurological: Positive for headaches.       Objective:   Physical Exam  Constitutional: She  appears well-developed and well-nourished.  HENT:  Right Ear: External ear normal.  Left Ear: External ear normal.  Mouth/Throat: Oropharynx is clear and moist.  Neck: Neck supple.  Cardiovascular: Normal rate and regular rhythm.  Pulmonary/Chest: Effort normal and breath sounds normal.  Lymphadenopathy:    She has no cervical adenopathy.       Assessment:     Persistent cough.  Suspect probable acute sinusitis.  CXR-?infiltrate Right lung-middle lobe- to be over read. Pt non-toxic in appearance and in no respiratory distress.    Plan:     -PA and lateral chest x-ray- as above -start Levaquin 500 mg po qd for 10 days -stay well hydrated -get adequate rest.  Would consider out of work for a few days  -follow up for any dyspnea or other concerns.  Eulas Post MD [place] [facility] at [facility]

## 2017-11-09 ENCOUNTER — Other Ambulatory Visit: Payer: Self-pay | Admitting: Family Medicine

## 2017-12-10 ENCOUNTER — Other Ambulatory Visit: Payer: Self-pay | Admitting: Family Medicine

## 2017-12-26 ENCOUNTER — Encounter: Payer: Self-pay | Admitting: Family Medicine

## 2017-12-27 ENCOUNTER — Other Ambulatory Visit: Payer: Self-pay

## 2017-12-27 MED ORDER — TRAMADOL HCL 50 MG PO TABS: 50.0000 mg | ORAL_TABLET | Freq: Four times a day (QID) | ORAL | 1 refills | Status: DC | PRN

## 2018-02-03 ENCOUNTER — Encounter: Payer: Self-pay | Admitting: Family Medicine

## 2018-02-03 ENCOUNTER — Other Ambulatory Visit: Payer: Self-pay

## 2018-02-03 ENCOUNTER — Ambulatory Visit: Payer: BC Managed Care – PPO | Admitting: Family Medicine

## 2018-02-03 ENCOUNTER — Ambulatory Visit (INDEPENDENT_AMBULATORY_CARE_PROVIDER_SITE_OTHER): Payer: BC Managed Care – PPO

## 2018-02-03 VITALS — BP 108/68 | HR 99 | Temp 98.2°F | Ht 63.0 in | Wt 183.1 lb

## 2018-02-03 DIAGNOSIS — M25561 Pain in right knee: Secondary | ICD-10-CM

## 2018-02-03 DIAGNOSIS — M79671 Pain in right foot: Secondary | ICD-10-CM | POA: Diagnosis not present

## 2018-02-03 DIAGNOSIS — G8929 Other chronic pain: Secondary | ICD-10-CM | POA: Diagnosis not present

## 2018-02-03 NOTE — Progress Notes (Signed)
  Subjective:     Patient ID: Natasha Fitzgerald, female   DOB: 28-May-1965, 53 y.o.   MRN: [id number]  HPI Patient seen for the following issues.  Both are new complaints.    Right foot pain.  Onset about 2 weeks ago.  No recent change of shoewear.  Pain is somewhat poorly localized but mostly on the dorsum of the foot and fairly diffuse.  She has not seen any ecchymosis, erythema, or swelling.  No history of similar pain.  She has pain with ambulation but also at rest.  No ankle pain.  No Achilles pain.  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  Other issue is bilateral knee pain right greater than left.  This is more of a chronic pain.  She states she saw orthopedics years ago and had x-rays and was told she had "osteoarthritis.  She has some stiffness.  She has pain mostly around the anterior knee.  No visible swelling.  She notices crepitus when flexing and extending the knee.  Past Medical History:  Diagnosis Date  . Anxiety   . Chronic headaches 09/28/2009  . DEPRESSION 09/28/2009  . Fibromyalgia   . History of kidney stones   . HYPERTENSION 09/28/2009  . IRREGULAR MENSES 09/28/2009  . RUQ pain    Past Surgical History:  Procedure Laterality Date  . CESAREAN SECTION  1997  . TONSILLECTOMY  1973    reports that she has never smoked. She has never used smokeless tobacco. She reports current alcohol use. She reports that she does not use drugs. family history includes Breast cancer in her maternal grandmother; Heart disease in her maternal grandfather and paternal grandfather; Hypertension in her mother. Allergies  Allergen Reactions  . Doxycycline     "headache, irritability, upset stomach"  . Penicillins     REACTION: hives     Review of Systems  Neurological: Negative for weakness and numbness.       Objective:   Physical Exam Constitutional:      Appearance: Normal appearance.  Cardiovascular:     Rate and Rhythm: Normal rate and regular rhythm.   Musculoskeletal:     Comments: Right foot reveals no edema.  No erythema.  No warmth.  No ecchymosis.  No localized tenderness.  Full range of motion right ankle.  No Achilles tenderness.  Right knee reveals minimal crepitus with flexion-extension.  No localized tenderness.  No effusion.  Neurological:     Mental Status: She is alert.        Assessment:     #1 poorly localized right foot pain.  Question tendinitis.  She does not have any bony tenderness to suggest stress fracture  #2 chronic knee pain right greater than left.  Question patellofemoral syndrome.    Plan:     -Obtain x-rays right knee -We recommend she try some icing 15 to 20 minutes 2-3 times daily to right foot and also recommend good supportive shoe wear-example good running shoes -Discussed strengthening exercises for quadriceps muscles -We will decide on orthopedic consult depending on results of x-ray  Eulas Post MD [facility] at [facility]

## 2018-02-03 NOTE — Patient Instructions (Signed)
Try some icing to right foot 15-20 minutes 2-3 times daily  We will call with the x-ray results of knee.

## 2018-02-04 ENCOUNTER — Other Ambulatory Visit: Payer: Self-pay | Admitting: Family Medicine

## 2018-03-04 ENCOUNTER — Other Ambulatory Visit: Payer: Self-pay | Admitting: Family Medicine

## 2018-03-12 ENCOUNTER — Encounter: Payer: Self-pay | Admitting: Family Medicine

## 2018-03-12 ENCOUNTER — Other Ambulatory Visit: Payer: Self-pay

## 2018-03-12 ENCOUNTER — Ambulatory Visit: Payer: BC Managed Care – PPO | Admitting: Family Medicine

## 2018-03-12 VITALS — BP 132/82 | HR 95 | Temp 97.9°F | Ht 63.0 in | Wt 182.0 lb

## 2018-03-12 DIAGNOSIS — H6691 Otitis media, unspecified, right ear: Secondary | ICD-10-CM | POA: Diagnosis not present

## 2018-03-12 DIAGNOSIS — J069 Acute upper respiratory infection, unspecified: Secondary | ICD-10-CM

## 2018-03-12 MED ORDER — AZITHROMYCIN 250 MG PO TABS: ORAL_TABLET | ORAL | 0 refills | Status: AC

## 2018-03-12 NOTE — Patient Instructions (Signed)

## 2018-03-12 NOTE — Progress Notes (Signed)
  Subjective:     Patient ID: Natasha Fitzgerald, female   DOB: 06-13-1965, 53 y.o.   MRN: [id number]  HPI Patient seen with onset of acute upper respiratory symptoms on 03/03/2018.  She developed some nasal congestion along with some cough, malaise, ear pressure.  Progressive right ear pain past couple days.  She has had green mucus both with cough and nasally.  Minimal body aches.  No fever.  No recent travels.  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  She states she has been sick multiple times this year.  She had some leftover Tussionex cough syrup which is helping her cough.  Occasional fleeting about 30 seconds duration chest pains which she thinks may be related to stress.  No exertional symptoms.  History of allergy to penicillin with hives and also previous intolerance with doxycycline.  Past Medical History:  Diagnosis Date  . Anxiety   . Chronic headaches 09/28/2009  . DEPRESSION 09/28/2009  . Fibromyalgia   . History of kidney stones   . HYPERTENSION 09/28/2009  . IRREGULAR MENSES 09/28/2009  . RUQ pain    Past Surgical History:  Procedure Laterality Date  . CESAREAN SECTION  1997  . TONSILLECTOMY  1973    reports that she has never smoked. She has never used smokeless tobacco. She reports current alcohol use. She reports that she does not use drugs. family history includes Breast cancer in her maternal grandmother; Heart disease in her maternal grandfather and paternal grandfather; Hypertension in her mother. Allergies  Allergen Reactions  . Doxycycline     "headache, irritability, upset stomach"  . Penicillins     REACTION: hives     Review of Systems  Constitutional: Positive for fatigue. Negative for chills and fever.  HENT: Positive for congestion, ear pain and sinus pressure. Negative for ear discharge and sore throat.   Respiratory: Positive for cough. Negative for shortness of breath and wheezing.        Objective:   Physical  Exam Constitutional:      Appearance: Normal appearance.  HENT:     Ears:     Comments: Eardrum is normal.  Right eardrum is extremely erythematous with distorted landmarks and slightly bulging.  No visible perforation. Neck:     Musculoskeletal: Neck supple.  Cardiovascular:     Rate and Rhythm: Normal rate and regular rhythm.  Pulmonary:     Effort: Pulmonary effort is normal.     Breath sounds: Normal breath sounds. No wheezing or rales.  Lymphadenopathy:     Cervical: No cervical adenopathy.  Neurological:     Mental Status: She is alert.        Assessment:     URI.  Suspect started as viral URI.  She has evidence for acute right otitis media.  Allergy to penicillin and doxycycline    Plan:     -Zithromax for 5 days -Plenty of fluids and rest. -Work note was written for today through Friday -Follow-up to reassess here in 1 to 2 weeks if symptoms not resolving  Eulas Post MD [facility] at [facility]

## 2018-05-07 ENCOUNTER — Other Ambulatory Visit: Payer: Self-pay | Admitting: Family Medicine

## 2018-05-30 ENCOUNTER — Other Ambulatory Visit: Payer: Self-pay | Admitting: Family Medicine

## 2018-07-29 ENCOUNTER — Other Ambulatory Visit: Payer: Self-pay | Admitting: Family Medicine

## 2018-08-23 ENCOUNTER — Other Ambulatory Visit: Payer: Self-pay | Admitting: Family Medicine

## 2018-08-27 ENCOUNTER — Other Ambulatory Visit: Payer: Self-pay | Admitting: Family Medicine

## 2018-08-27 DIAGNOSIS — Z1231 Encounter for screening mammogram for malignant neoplasm of breast: Secondary | ICD-10-CM

## 2018-09-17 ENCOUNTER — Other Ambulatory Visit: Payer: Self-pay | Admitting: Family Medicine

## 2018-09-24 ENCOUNTER — Other Ambulatory Visit: Payer: Self-pay

## 2018-09-24 ENCOUNTER — Encounter: Payer: Self-pay | Admitting: Family Medicine

## 2018-09-24 ENCOUNTER — Ambulatory Visit: Payer: BC Managed Care – PPO | Admitting: Family Medicine

## 2018-09-24 VITALS — BP 130/84 | HR 76 | Temp 97.7°F | Ht 63.0 in | Wt 192.3 lb

## 2018-09-24 DIAGNOSIS — Z8659 Personal history of other mental and behavioral disorders: Secondary | ICD-10-CM | POA: Diagnosis not present

## 2018-09-24 DIAGNOSIS — I10 Essential (primary) hypertension: Secondary | ICD-10-CM

## 2018-09-24 DIAGNOSIS — E038 Other specified hypothyroidism: Secondary | ICD-10-CM

## 2018-09-24 DIAGNOSIS — F419 Anxiety disorder, unspecified: Secondary | ICD-10-CM

## 2018-09-24 NOTE — Progress Notes (Addendum)
Subjective:     Patient ID: Natasha Fitzgerald, female   DOB: 1965-01-24, 53 y.o.   MRN: [id number]  HPI Patient is here for medical follow-up and med check.  She has hypothyroidism and is overdue for thyroid functions.  She takes levothyroxine 88 mcg daily.  She has had some nonspecific pain left lower neck region and also some increased fatigue and some insomnia.  She thinks some of the fatigue and insomnia may be stress related.  She works as an Data processing manager.  She found stress of pandemic especially in the early going was very taxing on her.  She has been on sertraline 100 mg daily for quite some time.  She has been on several years.  She has had occasional breakthrough anxiety episodes which sounded almost like panic attack.  She did not tolerate higher doses of sertraline.  She has questions about whether she should be switched to a different medication.  She has hypertension treated with losartan.  Blood pressure stable.  Past Medical History:  Diagnosis Date  . Anxiety   . Chronic headaches 09/28/2009  . DEPRESSION 09/28/2009  . Fibromyalgia   . History of kidney stones   . HYPERTENSION 09/28/2009  . IRREGULAR MENSES 09/28/2009  . RUQ pain    Past Surgical History:  Procedure Laterality Date  . CESAREAN SECTION  1997  . TONSILLECTOMY  1973    reports that she has never smoked. She has never used smokeless tobacco. She reports current alcohol use. She reports that she does not use drugs. family history includes Breast cancer in her maternal grandmother; Heart disease in her maternal grandfather and paternal grandfather; Hypertension in her mother. Allergies  Allergen Reactions  . Doxycycline     "headache, irritability, upset stomach"  . Penicillins     REACTION: hives     Review of Systems  Constitutional: Positive for fatigue.  Eyes: Negative for visual disturbance.  Respiratory: Negative for cough, chest tightness, shortness of breath and wheezing.    Cardiovascular: Negative for chest pain, palpitations and leg swelling.  Genitourinary: Negative for dysuria.  Neurological: Negative for dizziness, seizures, syncope, weakness, light-headedness and headaches.  Psychiatric/Behavioral: Positive for sleep disturbance. The patient is nervous/anxious.        Objective:   Physical Exam Constitutional:      Appearance: She is well-developed.  Eyes:     Pupils: Pupils are equal, round, and reactive to light.  Neck:     Musculoskeletal: Neck supple. No neck rigidity.     Thyroid: No thyromegaly.     Vascular: No JVD.  Cardiovascular:     Rate and Rhythm: Normal rate and regular rhythm.     Heart sounds: No gallop.   Pulmonary:     Effort: Pulmonary effort is normal. No respiratory distress.     Breath sounds: Normal breath sounds. No wheezing or rales.  Lymphadenopathy:     Cervical: No cervical adenopathy.  Neurological:     Mental Status: She is alert.        Assessment:     #1 hypothyroidism.  Needs follow-up labs  #2 hypertension stable  #3 history of mixed anxiety and depression.  She has had some recent breakthrough anxiety symptoms    Plan:     -Check labs first with TSH.  If suboptimal control titrate thyroid medication -We discussed possible switch to another SSRI but would like to get lab work first -continue Losartan.  Eulas Post MD [facility]  at [facility]  Thyroid normal range.  Will d/c Zoloft and start Lexapro 10 mg po qd  Eulas Post MD [facility] at [facility]

## 2018-09-25 LAB — TSH: TSH: 4.36 u[IU]/mL (ref 0.35–4.50)

## 2018-09-25 MED ORDER — ESCITALOPRAM OXALATE 10 MG PO TABS: 10.0000 mg | ORAL_TABLET | Freq: Every day | ORAL | 1 refills | Status: DC

## 2018-09-25 NOTE — Addendum Note (Signed)
Addended by: Eulas Post on: 09/25/2018 11:43 AM   Modules accepted: Orders

## 2018-09-29 ENCOUNTER — Encounter: Payer: Self-pay | Admitting: Family Medicine

## 2018-09-30 ENCOUNTER — Other Ambulatory Visit: Payer: Self-pay

## 2018-09-30 MED ORDER — LEVOTHYROXINE SODIUM 88 MCG PO TABS: 88.0000 ug | ORAL_TABLET | Freq: Every day | ORAL | 2 refills | Status: DC

## 2018-10-01 ENCOUNTER — Other Ambulatory Visit: Payer: Self-pay | Admitting: Family Medicine

## 2018-10-02 MED ORDER — LOSARTAN POTASSIUM 25 MG PO TABS: 50.0000 mg | ORAL_TABLET | Freq: Every day | ORAL | 0 refills | Status: DC

## 2018-10-03 ENCOUNTER — Other Ambulatory Visit: Payer: Self-pay | Admitting: Family Medicine

## 2018-10-06 MED ORDER — LOSARTAN POTASSIUM 25 MG PO TABS: 50.0000 mg | ORAL_TABLET | Freq: Every day | ORAL | 0 refills | Status: DC

## 2018-10-09 ENCOUNTER — Other Ambulatory Visit: Payer: Self-pay

## 2018-10-09 ENCOUNTER — Ambulatory Visit
Admission: RE | Admit: 2018-10-09 | Discharge: 2018-10-09 | Disposition: A | Discharge status: Home / Self Care | Payer: BC Managed Care – PPO | Source: Ambulatory Visit | Attending: Family Medicine | Admitting: Family Medicine

## 2018-10-09 DIAGNOSIS — Z1231 Encounter for screening mammogram for malignant neoplasm of breast: Secondary | ICD-10-CM

## 2018-11-25 ENCOUNTER — Ambulatory Visit (INDEPENDENT_AMBULATORY_CARE_PROVIDER_SITE_OTHER): Payer: BC Managed Care – PPO

## 2018-11-25 ENCOUNTER — Ambulatory Visit: Payer: BC Managed Care – PPO | Admitting: Family Medicine

## 2018-11-25 ENCOUNTER — Encounter: Payer: Self-pay | Admitting: Family Medicine

## 2018-11-25 ENCOUNTER — Other Ambulatory Visit: Payer: Self-pay

## 2018-11-25 VITALS — BP 120/80 | HR 99 | Temp 97.8°F | Ht 63.0 in | Wt 195.3 lb

## 2018-11-25 DIAGNOSIS — M79671 Pain in right foot: Secondary | ICD-10-CM

## 2018-11-25 DIAGNOSIS — F339 Major depressive disorder, recurrent, unspecified: Secondary | ICD-10-CM

## 2018-11-25 DIAGNOSIS — H9193 Unspecified hearing loss, bilateral: Secondary | ICD-10-CM | POA: Diagnosis not present

## 2018-11-25 DIAGNOSIS — E038 Other specified hypothyroidism: Secondary | ICD-10-CM

## 2018-11-25 NOTE — Progress Notes (Signed)
  Subjective:     Patient ID: Natasha Fitzgerald, female   DOB: Sep 27, 1965, 53 y.o.   MRN: [id number]  HPI Natasha Fitzgerald is here today to discuss the following issues.  New problem of right foot pain.  This is dorsal location and somewhat lateral and proximal over the region of the lateral cuneiform.  She denies any type of injury.  She has had some discomfort.  She noticed swelling about 3 weeks ago.  No overlying skin changes.  She has history of mixed anxiety and depression.  She had been on sertraline but had recent breakthrough symptoms.  We switched to Lexapro and she is very pleased thus far.  She feels her depression and anxiety symptoms are better.  She is also sleeping better.  She has hypothyroidism recent TSH at goal.  She complains of some chronic bilateral hearing loss.  She is in process of trying to get set up to see audiologist for further testing.  Past Medical History:  Diagnosis Date  . Anxiety   . Chronic headaches 09/28/2009  . DEPRESSION 09/28/2009  . Fibromyalgia   . History of kidney stones   . HYPERTENSION 09/28/2009  . IRREGULAR MENSES 09/28/2009  . RUQ pain    Past Surgical History:  Procedure Laterality Date  . CESAREAN SECTION  1997  . TONSILLECTOMY  1973    reports that she has never smoked. She has never used smokeless tobacco. She reports current alcohol use. She reports that she does not use drugs. family history includes Breast cancer in her maternal grandmother; Heart disease in her maternal grandfather and paternal grandfather; Hypertension in her mother. Allergies  Allergen Reactions  . Doxycycline     "headache, irritability, upset stomach"  . Penicillins     REACTION: hives     Review of Systems  Constitutional: Negative for appetite change and unexpected weight change.  Skin: Negative for rash.  Neurological: Negative for weakness and numbness.  Hematological: Negative for adenopathy.  Psychiatric/Behavioral: Negative for dysphoric mood. The  patient is not nervous/anxious.        Objective:   Physical Exam Vitals signs reviewed.  Constitutional:      Appearance: Normal appearance.  Cardiovascular:     Rate and Rhythm: Normal rate and regular rhythm.  Pulmonary:     Effort: Pulmonary effort is normal.     Breath sounds: Normal breath sounds.  Musculoskeletal:     Comments: She has firm bony prominence right lateral cuneiform region.  This is more prominent compared with the left foot.  No ganglion.  Mildly tender to palpation.  Full range of motion right ankle.  No metatarsal tenderness.  Neurological:     Mental Status: She is alert.        Assessment:     #1 right foot pain of 3 weeks duration without recent injury.  She has somewhat small bony prominence right proximal and lateral foot over the region of the lateral cuneiform.  No evidence for ganglion  #2 mixed anxiety and depression improved on Lexapro  #3 hypothyroidism with recent TSH at goal  #4 complaints of chronic bilateral hearing loss    Plan:     -Start with plain films right foot -We recommend she see audiologist for further hearing screening.  She will try to get back her preference of who to send referral to -Continue Lexapro 10 mg daily  Eulas Post MD [facility] at [facility]

## 2018-11-26 ENCOUNTER — Other Ambulatory Visit: Payer: Self-pay | Admitting: Family Medicine

## 2019-01-05 ENCOUNTER — Ambulatory Visit: Payer: BC Managed Care – PPO | Admitting: Audiology

## 2019-02-19 ENCOUNTER — Other Ambulatory Visit: Payer: Self-pay | Admitting: Family Medicine

## 2019-02-23 ENCOUNTER — Other Ambulatory Visit: Payer: Self-pay | Admitting: Family Medicine

## 2019-02-24 NOTE — Telephone Encounter (Signed)
Refill for 6 months. 

## 2019-02-28 ENCOUNTER — Ambulatory Visit: Payer: BC Managed Care – PPO | Attending: Internal Medicine

## 2019-02-28 DIAGNOSIS — Z23 Encounter for immunization: Secondary | ICD-10-CM | POA: Insufficient documentation

## 2019-02-28 NOTE — Progress Notes (Signed)
   Covid-19 Vaccination Clinic  Name:  Natasha Fitzgerald    MRN: [id number] DOB: [birth year]  02/28/2019  [name] was observed post Covid-19 immunization for 15 minutes without incidence. She was provided with Vaccine Information Sheet and instruction to access the V-Safe system.   [name] was instructed to call 911 with any severe reactions post vaccine: [name] Difficulty breathing  . Swelling of your face and throat  . A fast heartbeat  . A bad rash all over your body  . Dizziness and weakness    Immunizations Administered    Name Date Dose VIS Date Route   Pfizer COVID-19 Vaccine 02/28/2019  5:35 PM 0.3 mL 12/12/2018 Intramuscular   Manufacturer: [place]   Lot: [id number]   [place]: [id number]

## 2019-03-21 ENCOUNTER — Ambulatory Visit: Payer: BC Managed Care – PPO

## 2019-03-21 NOTE — Progress Notes (Signed)
   Covid-19 Vaccination Clinic  Name:  Natasha Fitzgerald    MRN: [id number] DOB: 24-Jun-1965  03/21/2019  Natasha Fitzgerald was observed post Covid-19 immunization for 15 minutes without incident. She was provided with Vaccine Information Sheet and instruction to access the V-Safe system.   Natasha Fitzgerald was instructed to call 911 with any severe reactions post vaccine: [name] Difficulty breathing  . Swelling of face and throat  . A fast heartbeat  . A bad rash all over body  . Dizziness and weakness

## 2019-03-27 ENCOUNTER — Ambulatory Visit (INDEPENDENT_AMBULATORY_CARE_PROVIDER_SITE_OTHER): Payer: BC Managed Care – PPO | Admitting: Family Medicine

## 2019-03-27 ENCOUNTER — Encounter: Payer: Self-pay | Admitting: Family Medicine

## 2019-03-27 ENCOUNTER — Other Ambulatory Visit: Payer: Self-pay

## 2019-03-27 VITALS — BP 130/86 | HR 65 | Temp 97.6°F | Wt 193.2 lb

## 2019-03-27 DIAGNOSIS — R350 Frequency of micturition: Secondary | ICD-10-CM

## 2019-03-27 DIAGNOSIS — M8949 Other hypertrophic osteoarthropathy, multiple sites: Secondary | ICD-10-CM | POA: Diagnosis not present

## 2019-03-27 DIAGNOSIS — K219 Gastro-esophageal reflux disease without esophagitis: Secondary | ICD-10-CM | POA: Diagnosis not present

## 2019-03-27 DIAGNOSIS — N3941 Urge incontinence: Secondary | ICD-10-CM | POA: Diagnosis not present

## 2019-03-27 DIAGNOSIS — M159 Polyosteoarthritis, unspecified: Secondary | ICD-10-CM

## 2019-03-27 LAB — POCT URINALYSIS DIPSTICK
Bilirubin, UA: NEGATIVE
Bilirubin, UA: NEGATIVE
Blood, UA: NEGATIVE
Blood, UA: NEGATIVE
Glucose, UA: NEGATIVE
Glucose, UA: NEGATIVE
Ketones, UA: NEGATIVE
Ketones, UA: NEGATIVE
Nitrite, UA: NEGATIVE
Nitrite, UA: NEGATIVE
Protein, UA: NEGATIVE
Protein, UA: NEGATIVE
Spec Grav, UA: 1.015 (ref 1.010–1.025)
Spec Grav, UA: 1.015 (ref 1.010–1.025)
Urobilinogen, UA: 0.2 E.U./dL
Urobilinogen, UA: 0.2 E.U./dL
pH, UA: 6 (ref 5.0–8.0)
pH, UA: 6 (ref 5.0–8.0)

## 2019-03-27 MED ORDER — MELOXICAM 15 MG PO TABS: 15.0000 mg | ORAL_TABLET | Freq: Every day | ORAL | 5 refills | Status: DC | PRN

## 2019-03-27 NOTE — Patient Instructions (Signed)
Urinary Frequency, Adult Urinary frequency means urinating more often than usual. You may urinate every 1-2 hours even though you drink a normal amount of fluid and do not have a bladder infection or condition. Although you urinate more often than normal, the total amount of urine produced in a day is normal. With urinary frequency, you may have an urgent need to urinate often. The stress and anxiety of needing to find a bathroom quickly can make this urge worse. This condition may go away on its own or you may need treatment at home. Home treatment may include bladder training, exercises, taking medicines, or making changes to your diet. Follow these instructions at home: Bladder health   Keep a bladder diary if told by your health care provider. Keep track of: ? What you eat and drink. ? How often you urinate. ? How much you urinate.  Follow a bladder training program if told by your health care provider. This may include: ? Learning to delay going to the bathroom. ? Double urinating (voiding). This helps if you are not completely emptying your bladder. ? Scheduled voiding.  Do Kegel exercises as told by your health care provider. Kegel exercises strengthen the muscles that help control urination, which may help the condition. Eating and drinking  If told by your health care provider, make diet changes, such as: ? Avoiding caffeine. ? Drinking fewer fluids, especially alcohol. ? Not drinking in the evening. ? Avoiding foods or drinks that may irritate the bladder. These include coffee, tea, soda, artificial sweeteners, citrus, tomato-based foods, and chocolate. ? Eating foods that help prevent or ease constipation. Constipation can make this condition worse. Your health care provider may recommend that you:  Drink enough fluid to keep your urine pale yellow.  Take over-the-counter or prescription medicines.  Eat foods that are high in fiber, such as beans, whole grains, and fresh  fruits and vegetables.  Limit foods that are high in fat and processed sugars, such as fried or sweet foods. General instructions  Take over-the-counter and prescription medicines only as told by your health care provider.  Keep all follow-up visits as told by your health care provider. This is important. Contact a health care provider if:  You start urinating more often.  You feel pain or irritation when you urinate.  You notice blood in your urine.  Your urine looks cloudy.  You develop a fever.  You begin vomiting. Get help right away if:  You are unable to urinate. Summary  Urinary frequency means urinating more often than usual. With urinary frequency, you may urinate every 1-2 hours even though you drink a normal amount of fluid and do not have a bladder infection or other bladder condition.  Your health care provider may recommend that you keep a bladder diary, follow a bladder training program, or make dietary changes.  If told by your health care provider, do Kegel exercises to strengthen the muscles that help control urination.  Take over-the-counter and prescription medicines only as told by your health care provider.  Contact a health care provider if your symptoms do not improve or get worse. This information is not intended to replace advice given to you by your health care provider. Make sure you discuss any questions you have with your health care provider. Document Revised: 06/27/2017 Document Reviewed: 06/27/2017 Elsevier Patient Education  2020 Elsevier Inc.  

## 2019-03-27 NOTE — Progress Notes (Signed)
Subjective:     Patient ID: Natasha Fitzgerald, female   DOB: 09/18/1965, 54 y.o.   MRN: [id number]  HPI   Natasha Fitzgerald is here with she states about 50-month history of some urine frequency.  She had occasional burning with urination but predominately frequency.  No history of diabetes.  She drinks about 1 cup of coffee in the morning but really minimal caffeine overall.  She states she gets up usually 3-4 times at night to urinate.  She sometimes has to go every 30 minutes during the day.  She does not feel that she is urinating larger volume than usual.  In addition to urine frequency is he is having some urgency.  She had one episode when shopping where she was unable to get to the restroom in time.  She has also had some stress incontinence type symptoms where she has had some leakage with sudden movement.  No history of vaginal birth.  She had 1 C-section.  Appetite and weight are stable.  No diuretic use.  She has history of GERD symptoms.  She has taken omeprazole in the past.  Symptoms are intermittent.  She has osteoarthritis symptoms and is taken meloxicam in the past which works well.  Requesting refill.  Past Medical History:  Diagnosis Date  . Anxiety   . Chronic headaches 09/28/2009  . DEPRESSION 09/28/2009  . Fibromyalgia   . History of kidney stones   . HYPERTENSION 09/28/2009  . IRREGULAR MENSES 09/28/2009  . RUQ pain    Past Surgical History:  Procedure Laterality Date  . CESAREAN SECTION  1997  . TONSILLECTOMY  1973    reports that she has never smoked. She has never used smokeless tobacco. She reports current alcohol use. She reports that she does not use drugs. family history includes Breast cancer in her maternal grandmother; Heart disease in her maternal grandfather and paternal grandfather; Hypertension in her mother. Allergies  Allergen Reactions  . Doxycycline     "headache, irritability, upset stomach"  . Penicillins     REACTION: hives     Review of  Systems  Constitutional: Negative for appetite change, chills, fever and unexpected weight change.  Gastrointestinal: Negative for abdominal pain.  Genitourinary: Positive for frequency and urgency. Negative for difficulty urinating, flank pain and hematuria.  Musculoskeletal: Positive for arthralgias.  Neurological: Negative for dizziness and headaches.       Objective:   Physical Exam Vitals reviewed.  Constitutional:      Appearance: Normal appearance.  Cardiovascular:     Rate and Rhythm: Normal rate and regular rhythm.  Pulmonary:     Effort: Pulmonary effort is normal.     Breath sounds: Normal breath sounds.  Neurological:     Mental Status: She is alert.        Assessment:     #1 urine frequency.  She is describing increased frequency of urination and occasional burning.  Urine dipstick reveals only trace leukocytes otherwise negative.  Doubt infectious.  Suspect she has combination of mixed incontinence with urgency and stress incontinence.  She has noted substantial increase in frequency over a couple months.  Doubt diabetes insipidus  #2 osteoarthritis involving multiple joints  #3 history of GERD.  Generally controlled with omeprazole in the past    Plan:     -Check basic metabolic panel, urine osmolality, consider vasopressin level  -Urine culture sent  -If above normal consider medication such as oxybutynin, Vesicare, or Detrol- although she has dry mouth at baseline  which this could exacerbate  -Refill meloxicam for as needed use  -Try to avoid regular PPI use.  We suggested Pepcid 20 mg twice daily  Eulas Post MD [facility] at [facility]

## 2019-03-28 LAB — URINE CULTURE
MICRO NUMBER:: 10297153
Result:: NO GROWTH
SPECIMEN QUALITY:: ADEQUATE

## 2019-03-28 LAB — BASIC METABOLIC PANEL
BUN: 17 mg/dL (ref 7–25)
CO2: 28 mmol/L (ref 20–32)
Calcium: 9.6 mg/dL (ref 8.6–10.4)
Chloride: 104 mmol/L (ref 98–110)
Creat: 0.76 mg/dL (ref 0.50–1.05)
Glucose, Bld: 80 mg/dL (ref 65–99)
Potassium: 4.1 mmol/L (ref 3.5–5.3)
Sodium: 139 mmol/L (ref 135–146)

## 2019-04-01 LAB — OSMOLALITY, URINE: Osmolality, Ur: 720 mOsm/kg (ref 50–1200)

## 2019-04-03 LAB — ARGININE VASOPRESSIN HORMONE
ADH: <0.8 pg/mL (ref 0.0–4.7)
Osmolality Meas: 282 mOsmol/kg (ref 275–295)

## 2019-04-29 ENCOUNTER — Ambulatory Visit: Payer: BC Managed Care – PPO | Admitting: Family Medicine

## 2019-04-29 ENCOUNTER — Encounter: Payer: Self-pay | Admitting: Family Medicine

## 2019-04-29 ENCOUNTER — Other Ambulatory Visit: Payer: Self-pay

## 2019-04-29 VITALS — BP 122/76 | HR 92 | Temp 97.8°F | Wt 191.0 lb

## 2019-04-29 DIAGNOSIS — M79672 Pain in left foot: Secondary | ICD-10-CM | POA: Diagnosis not present

## 2019-04-29 MED ORDER — DICLOFENAC SODIUM 1 % EX GEL: 2.0000 g | Freq: Four times a day (QID) | CUTANEOUS | 1 refills | Status: AC

## 2019-04-29 NOTE — Progress Notes (Signed)
  Subjective:     Patient ID: Natasha Fitzgerald, female   DOB: October 23, 1965, 54 y.o.   MRN: [id number]  HPI   Natasha Fitzgerald seen with left foot and ankle pain which started about 2 weeks ago.  She is not aware of any definite injury.  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  She had been tried some icing and a compression wrap without much improvement.  No severe instability.  No visible bruising.  Pain is moderate.  Location of pain is medially just below the tibia.  She denies any Achilles tenderness.  She has had some very mild swelling.  No warmth or erythema.  She has taken some meloxicam by mouth without much improvement.  Past Medical History:  Diagnosis Date  . Anxiety   . Chronic headaches 09/28/2009  . DEPRESSION 09/28/2009  . Fibromyalgia   . History of kidney stones   . HYPERTENSION 09/28/2009  . IRREGULAR MENSES 09/28/2009  . RUQ pain    Past Surgical History:  Procedure Laterality Date  . CESAREAN SECTION  1997  . TONSILLECTOMY  1973    reports that she has never smoked. She has never used smokeless tobacco. She reports current alcohol use. She reports that she does not use drugs. family history includes Breast cancer in her maternal grandmother; Heart disease in her maternal grandfather and paternal grandfather; Hypertension in her mother. Allergies  Allergen Reactions  . Doxycycline     "headache, irritability, upset stomach"  . Penicillins     REACTION: hives     Review of Systems  Constitutional: Negative for chills and fever.  Neurological: Negative for weakness.       Objective:   Physical Exam Vitals reviewed.  Constitutional:      Appearance: Normal appearance.  Cardiovascular:     Rate and Rhythm: Normal rate and regular rhythm.  Musculoskeletal:     Comments: Left foot and ankle reveals possibly some very mild subtle swelling just below the distal tibia.  She has no warmth.  No erythema.  No ecchymosis.  No Achilles tenderness.  She has some  tenderness over the talus near the attachment of the posterior tibial tendon.  She has some mild pain with dorsiflexion and plantarflexion in that region  Neurological:     Mental Status: She is alert.        Assessment:     Left foot pain.  Question posterior tibial tendinitis.  She does not recall any injury.    doubt stress fracture    Plan:     -Continue icing several times daily -Continue compression wrap for comfort -Try topical diclofenac 1% gel 3-4 times daily -Touch base if not improving over the next few weeks  Eulas Post MD [facility] at [facility]

## 2019-04-29 NOTE — Patient Instructions (Signed)
Continue with intermittent icing and compression  Use the Diclofenac 3-4 times daily.

## 2019-04-30 ENCOUNTER — Encounter: Payer: Self-pay | Admitting: Family Medicine

## 2019-05-17 ENCOUNTER — Other Ambulatory Visit: Payer: Self-pay | Admitting: Family Medicine

## 2019-05-22 ENCOUNTER — Other Ambulatory Visit: Payer: Self-pay | Admitting: Family Medicine

## 2019-06-29 ENCOUNTER — Encounter: Payer: Self-pay | Admitting: Family Medicine

## 2019-06-29 ENCOUNTER — Ambulatory Visit: Payer: BC Managed Care – PPO | Admitting: Family Medicine

## 2019-06-29 ENCOUNTER — Other Ambulatory Visit: Payer: Self-pay

## 2019-06-29 VITALS — BP 140/86 | HR 95 | Temp 97.8°F | Wt 192.0 lb

## 2019-06-29 DIAGNOSIS — M25572 Pain in left ankle and joints of left foot: Secondary | ICD-10-CM | POA: Diagnosis not present

## 2019-06-29 DIAGNOSIS — Z1211 Encounter for screening for malignant neoplasm of colon: Secondary | ICD-10-CM

## 2019-06-29 NOTE — Progress Notes (Signed)
Established Patient Office Visit  Subjective:  Patient ID: Natasha Fitzgerald, female    DOB: 1965/02/04  Age: 54 y.o. MRN: [id number]  CC:  Chief Complaint  Patient presents with  . Ankle Pain    left ankle and foot pain has been hurting for a couple months now     HPI [name] presents for persistent left ankle pain now for couple months duration.  Refer to previous note from 04/29/2019 for details  "Merve seen with left foot and ankle pain which started about 2 weeks ago.  She is not aware of any definite injury.  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  She had been tried some icing and a compression wrap without much improvement.  No severe instability.  No visible bruising.  Pain is moderate.  Location of pain is medially just below the tibia.  She denies any Achilles tenderness.  She has had some very mild swelling.  No warmth or erythema.  She has taken some meloxicam by mouth without much improvement."  She has tried oral anti-inflammatories, compression, icing, topical diclofenac without improvement.  Location remains medial aspect of the ankle just below the medial malleolus.  No instability.  She also would like to go ahead and get referral for screening colonoscopy.  She has never had baseline since turning 49  Past Medical History:  Diagnosis Date  . Anxiety   . Chronic headaches 09/28/2009  . DEPRESSION 09/28/2009  . Fibromyalgia   . History of kidney stones   . HYPERTENSION 09/28/2009  . IRREGULAR MENSES 09/28/2009  . RUQ pain     Past Surgical History:  Procedure Laterality Date  . CESAREAN SECTION  1997  . TONSILLECTOMY  1973    Family History  Problem Relation Age of Onset  . Hypertension Mother   . Breast cancer Maternal Grandmother   . Heart disease Maternal Grandfather   . Heart disease Paternal Grandfather   . Colon cancer Neg Hx     Social History   Socioeconomic History  . Marital status: Married    Spouse name: Not on  file  . Number of children: Not on file  . Years of education: Not on file  . Highest education level: Not on file  Occupational History  . Not on file  Tobacco Use  . Smoking status: Never Smoker  . Smokeless tobacco: Never Used  Vaping Use  . Vaping Use: Never used  Substance and Sexual Activity  . Alcohol use: Yes    Comment: seldom   . Drug use: No  . Sexual activity: Not on file  Other Topics Concern  . Not on file  Social History Narrative  . Not on file   Social Determinants of Health   Financial Resource Strain:   . Difficulty of Paying Living Expenses:   Food Insecurity:   . Worried About Charity fundraiser in the Last Year:   . Arboriculturist in the Last Year:   Transportation Needs:   . Film/video editor (Medical):   [name] Lack of Transportation (Non-Medical):   Physical Activity:   . Days of Exercise per Week:   . Minutes of Exercise per Session:   Stress:   . Feeling of Stress :   Social Connections:   . Frequency of Communication with Friends and Family:   . Frequency of Social Gatherings with Friends and Family:   . Attends Religious Services:   . Active  Member of Clubs or Organizations:   . Attends Archivist Meetings:   [name] Marital Status:   Intimate Partner Violence:   . Fear of Current or Ex-Partner:   . Emotionally Abused:   [name] Physically Abused:   . Sexually Abused:     Outpatient Medications Prior to Visit  Medication Sig Dispense Refill  . acetaminophen (TYLENOL) 500 MG tablet Take 500 mg by mouth every 6 (six) hours as needed.      . cyclobenzaprine (FLEXERIL) 5 MG tablet Take 1 tablet (5 mg total) by mouth at bedtime as needed. 90 tablet 1  . diclofenac Sodium (VOLTAREN) 1 % GEL Apply 2 g topically 4 (four) times daily. 100 g 1  . escitalopram (LEXAPRO) 10 MG tablet TAKE 1 TABLET BY MOUTH EVERY DAY 90 tablet 1  . levothyroxine (SYNTHROID) 88 MCG tablet TAKE 1 TABLET (88 MCG TOTAL) BY MOUTH DAILY BEFORE BREAKFAST. 90 tablet 1    . losartan (COZAAR) 25 MG tablet TAKE 2 TABLETS BY MOUTH EVERY DAY 180 tablet 0  . meloxicam (MOBIC) 15 MG tablet Take 1 tablet (15 mg total) by mouth daily as needed. 30 tablet 5  . tobramycin (TOBREX) 0.3 % ophthalmic solution INSTILL 1-2 DROPS INTO THE RIGHT EYE EVERY 4HRS X 7 DAYS (AVOID IN LACTATION)  0  . traMADol (ULTRAM) 50 MG tablet Take 1 tablet (50 mg total) by mouth every 6 (six) hours as needed. 60 tablet 1   No facility-administered medications prior to visit.    Allergies  Allergen Reactions  . Doxycycline     "headache, irritability, upset stomach"  . Penicillins     REACTION: hives    ROS Review of Systems  Gastrointestinal: Negative for abdominal pain, blood in stool, constipation and diarrhea.  Neurological: Negative for weakness and numbness.      Objective:    Physical Exam Vitals reviewed.  Constitutional:      Appearance: Normal appearance.  Cardiovascular:     Rate and Rhythm: Normal rate and regular rhythm.  Pulmonary:     Effort: Pulmonary effort is normal.     Breath sounds: Normal breath sounds.  Musculoskeletal:     Comments: Left ankle reveals some mild tenderness just inferior to the medial malleolus.  She has full range of motion in the ankle.  There is no warmth.  No ecchymosis.  No erythema.  Neurological:     Mental Status: She is alert.     BP 140/86 (BP Location: Left Arm, Patient Position: Sitting, Cuff Size: Normal)   Pulse 95   Temp 97.8 F (36.6 C) (Temporal)   Wt 192 lb (87.1 kg)   LMP 12/26/2012   SpO2 97%   BMI 34.01 kg/m  Wt Readings from Last 3 Encounters:  06/29/19 192 lb (87.1 kg)  04/29/19 191 lb (86.6 kg)  03/27/19 193 lb 3.2 oz (87.6 kg)     Health Maintenance Due  Topic Date Due  . Hepatitis C Screening  Never done  . HIV Screening  Never done  . COLONOSCOPY  Never done  . PAP SMEAR-Modifier  07/06/2016  . TETANUS/TDAP  06/02/2019    There are no preventive care reminders to display for this  patient.  Lab Results  Component Value Date   TSH 4.36 09/24/2018   Lab Results  Component Value Date   WBC 8.9 07/01/2013   HGB 14.7 07/01/2013   HCT 43.7 07/01/2013   MCV 86.2 07/01/2013   PLT 238.0 07/01/2013   Lab  Results  Component Value Date   NA 139 03/27/2019   K 4.1 03/27/2019   CO2 28 03/27/2019   GLUCOSE 80 03/27/2019   BUN 17 03/27/2019   CREATININE 0.76 03/27/2019   BILITOT 0.7 07/01/2013   ALKPHOS 93 07/01/2013   AST 18 07/01/2013   ALT 20 07/01/2013   PROT 7.3 07/01/2013   ALBUMIN 4.1 07/01/2013   CALCIUM 9.6 03/27/2019   GFR 89.57 03/18/2015   Lab Results  Component Value Date   CHOL 259 (A) 01/28/2015   Lab Results  Component Value Date   HDL 47 01/28/2015   Lab Results  Component Value Date   LDLCALC 187 01/28/2015   Lab Results  Component Value Date   TRIG 126 01/28/2015   Lab Results  Component Value Date   CHOLHDL 5 07/01/2013   No results found for: HGBA1C    Assessment & Plan:   Problem List Items Addressed This Visit    None    Visit Diagnoses    Acute left ankle pain    -  Primary   Relevant Orders   Ambulatory referral to Sports Medicine    She has tried multiple conservative therapies including compression, icing, elevation, topical anti-inflammatories, and oral anti-inflammatories without improvement Set up sports medicine referral as above  No orders of the defined types were placed in this encounter.   Follow-up: No follow-ups on file.    Carolann Littler, MD

## 2019-06-29 NOTE — Patient Instructions (Signed)
We are setting up sports medicine referral.

## 2019-07-01 ENCOUNTER — Encounter: Payer: Self-pay | Admitting: Gastroenterology

## 2019-07-02 ENCOUNTER — Encounter: Payer: Self-pay | Admitting: Family Medicine

## 2019-07-02 ENCOUNTER — Other Ambulatory Visit: Payer: Self-pay

## 2019-07-02 ENCOUNTER — Ambulatory Visit (INDEPENDENT_AMBULATORY_CARE_PROVIDER_SITE_OTHER): Payer: BC Managed Care – PPO

## 2019-07-02 ENCOUNTER — Ambulatory Visit: Payer: Self-pay

## 2019-07-02 ENCOUNTER — Ambulatory Visit: Payer: BC Managed Care – PPO | Admitting: Family Medicine

## 2019-07-02 VITALS — BP 158/80 | HR 98 | Ht 63.0 in | Wt 192.4 lb

## 2019-07-02 DIAGNOSIS — M25572 Pain in left ankle and joints of left foot: Secondary | ICD-10-CM

## 2019-07-02 NOTE — Patient Instructions (Addendum)
Thank you for coming in today. Plan for exercises.  Use body helix full ankle sleeve.  Use voltaren gel.  If not better will do formal PT and nitroglycerine patches.  Recheck in about 6 weeks.   Please perform the exercise program that we have prepared for you and gone over in detail on a daily basis.  In addition to the handout you were provided you can access your program through: [email or web address]-[email or web address]   Your unique program code is:  [id number]  I recommend you obtained a compression sleeve to help with your joint problems. There are many options on the market however I recommend obtaining a ankle Body Helix compression sleeve.  You can find information (including how to appropriate measure yourself for sizing) can be found at www.Body http://www.lambert.com/.  Many of these products are health savings account (HSA) eligible.   You can use the compression sleeve at any time throughout the day but is most important to use while being active as well as for 2 hours post-activity.   It is appropriate to ice following activity with the compression sleeve in place.    Get xray today.    Posterior Tibial Tendinitis Rehab Ask your health care provider which exercises are safe for you. Do exercises exactly as told by your health care provider and adjust them as directed. It is normal to feel mild stretching, pulling, tightness, or discomfort as you do these exercises. Stop right away if you feel sudden pain or your pain gets worse. Do not begin these exercises until told by your health care provider. Stretching and range-of-motion exercises These exercises warm up your muscles and joints and improve the movement and flexibility in your ankle and foot. These exercises may also help to relieve pain. Standing wall calf stretch, knee straight  1. Stand with your hands against a wall. 2. Extend your left / right leg behind you, and bend your front knee slightly. If directed, place a folded washcloth under the arch  of your foot for support. 3. Point the toes of your back foot slightly inward. 4. Keeping your heels on the floor and your back knee straight, shift your weight toward the wall. Do not allow your back to arch. You should feel a gentle stretch in your upper left / right calf. 5. Hold this position for __________ seconds. Repeat __________ times. Complete this exercise __________ times a day. Standing wall calf stretch, knee bent 1. Stand with your hands against a wall. 2. Extend your left / right leg behind you, and bend your front knee slightly. If directed, place a folded washcloth under the arch of your foot for support. 3. Point the toes of your back foot slightly inward. 4. Unlock your back knee so it is bent. Keep your heels on the floor. You should feel a gentle stretch deep in your lower left / right calf. 5. Hold this position for __________ seconds. Repeat __________ times. Complete this exercise __________ times a day. Strengthening exercises These exercises build strength and endurance in your ankle and foot. Endurance is the ability to use your muscles for a long time, even after they get tired. Ankle inversion with band 1. Secure one end of a rubber exercise band or tubing to a fixed object, such as a table leg or a pole, that will stay still when the band is pulled. 2. Loop the other end of the band around the middle of your left / right foot. 3. Sit on the floor facing the  object with your left / right leg extended. The band or tube should be slightly tense when your foot is relaxed. 4. Leading with your big toe, slowly bring your left / right foot and ankle inward, toward your other foot (inversion). 5. Hold this position for __________ seconds. 6. Slowly return your foot to the starting position. Repeat __________ times. Complete this exercise __________ times a day. Towel curls  1. Sit in a chair on a non-carpeted surface, and put your feet on the floor. 2. Place a towel in  front of your feet. If told by your health care provider, add a __________ weight to the end of the towel. 3. Keeping your heel on the floor, put your left / right foot on the towel. 4. Pull the towel toward you by grabbing the towel with your toes and curling them under. Keep your heel on the floor while you do this. 5. Let your toes relax. 6. Grab the towel with your toes again. Keep going until the towel is completely underneath your foot. Repeat __________ times. Complete this exercise __________ times a day. Balance exercise This exercise improves or maintains your balance. Balance is important in preventing falls. Single leg stand 1. Without wearing shoes, stand near a railing or in a doorway. You may hold on to the railing or door frame as needed for balance. 2. Stand on your left / right foot. Keep your big toe down on the floor and try to keep your arch lifted. ? If balancing in this position is too easy, try the exercise with your eyes closed or while standing on a pillow. 3. Hold this position for __________ seconds. Repeat __________ times. Complete this exercise __________ times a day. This information is not intended to replace advice given to you by your health care provider. Make sure you discuss any questions you have with your health care provider. Document Revised: 04/15/2018 Document Reviewed: 02/19/2018 Elsevier Patient Education  [place].

## 2019-07-02 NOTE — Progress Notes (Signed)
Subjective:    I'm seeing this patient as a consultation for:  Dr. Elease Hashimoto. Note will be routed back to referring provider/PCP.  CC: L ankle pain  I, Molly Weber, LAT, ATC, am serving as scribe for Dr. [name].  HPI: Pt is a 54 y/o female presenting w/ c/o L medial ankle pain x approximately 2.5 months w/ no known MOI.  She has been seen 2x by her PCP for these c/o and con't to have pain in her L medial ankle and foot.  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  Radiating pain: yes into her L medial lower leg and ant/dorsal foot L ankle/foot swelling: no Aggravating factors: prolonged standing; walking; weight-bearing activity Treatments tried: Meloxicam; ice; compression wrap; Voltaren gel  Past medical history, Surgical history, Family history, Social history, Allergies, and medications have been entered into the medical record, reviewed.   Review of Systems: No new headache, visual changes, nausea, vomiting, diarrhea, constipation, dizziness, abdominal pain, skin rash, fevers, chills, night sweats, weight loss, swollen lymph nodes, body aches, joint swelling, muscle aches, chest pain, shortness of breath, mood changes, visual or auditory hallucinations.   Objective:    Vitals:   07/02/19 1436  BP: (!) 158/80  Pulse: 98  SpO2: 98%   General: Well Developed, well nourished, and in no acute distress.  Neuro/Psych: Alert and oriented x3, extra-ocular muscles intact, able to move all 4 extremities, sensation grossly intact. Skin: Warm and dry, no rashes noted.  Respiratory: Not using accessory muscles, speaking in full sentences, trachea midline.  Cardiovascular: Pulses palpable, no extremity edema. Abdomen: Does not appear distended. MSK: Left foot and ankle Slight swelling at medial ankle along course of posterior tibialis tendon. Normal motion. Mildly tender to palpation along course of posterior tibialis tendon. Stable  ligamentous exam. Intact strength. Pain with resisted inversion. Pulses capillary fill and sensation are intact distally.  Lab and Radiology Results  X-ray images left foot obtained today personally and independently reviewed Normal-appearing with no severe degenerative changes.  No acute fractures. Await formal radiology review  Diagnostic Limited MSK Ultrasound of: Left foot and ankle medially Posterior tibialis tendon identified. Intact at ankle. However at midfoot near tarsal tunnel tendon remains intact however there is hypoechoic fluid tracking tendon sheath consistent with tenosynovitis. The tendon is tender to palpation in this area with ultrasound probe. Normal appearing insertion on the navicular prominence Impression: Posterior tibialis tendinitis   Impression and Recommendations:    Assessment and Plan: 54 y.o. female with left medial foot pain ongoing for about 3 months. Consistent with posterior tibialis tendinitis. Plan for home exercise program as taught in clinic today by ATC compression sleeve Voltaren gel and scaphoid pads. Recheck back in about 6 months. If not improving patient will notify me sooner and we will proceed with physical therapy and consider nitroglycerin patch protocol.[name]  PDMP not reviewed this encounter. Orders Placed This Encounter  Procedures  . [place] LIMITED JOINT SPACE STRUCTURES LOW LEFT(NO LINKED CHARGES)    Order Specific Question:   Reason for Exam (SYMPTOM  OR DIAGNOSIS REQUIRED)    Answer:   L medial ankle pain    Order Specific Question:   Preferred imaging location?    Answer:   [place]  . DG Foot Complete Left    Standing Status:   Future    Number of Occurrences:   1    Standing Expiration Date:  07/01/2020    Order Specific Question:   Reason for Exam (SYMPTOM  OR DIAGNOSIS REQUIRED)    Answer:   eval foot pain    Order Specific Question:   Is patient pregnant?    Answer:   No    Order Specific Question:    Preferred imaging location?    Answer:   [facility]    Order Specific Question:   Radiology Contrast Protocol - do NOT remove file path    Answer:   \\[email or web address].pdf   No orders of the defined types were placed in this encounter.   Discussed warning signs or symptoms. Please see discharge instructions. Patient expresses understanding.   The above documentation has been reviewed and is accurate and complete [name], M.D.

## 2019-07-07 NOTE — Progress Notes (Signed)
No fracture or significant arthritis.  Small heel spur present.

## 2019-08-09 ENCOUNTER — Other Ambulatory Visit: Payer: Self-pay | Admitting: Family Medicine

## 2019-08-20 ENCOUNTER — Ambulatory Visit: Payer: BC Managed Care – PPO | Admitting: Family Medicine

## 2019-08-20 ENCOUNTER — Other Ambulatory Visit: Payer: Self-pay

## 2019-08-20 ENCOUNTER — Ambulatory Visit: Payer: Self-pay

## 2019-08-20 ENCOUNTER — Encounter: Payer: Self-pay | Admitting: Family Medicine

## 2019-08-20 ENCOUNTER — Other Ambulatory Visit: Payer: Self-pay | Admitting: Family Medicine

## 2019-08-20 VITALS — BP 130/86 | HR 89 | Ht 63.0 in | Wt 192.2 lb

## 2019-08-20 DIAGNOSIS — M25572 Pain in left ankle and joints of left foot: Secondary | ICD-10-CM

## 2019-08-20 DIAGNOSIS — M76822 Posterior tibial tendinitis, left leg: Secondary | ICD-10-CM | POA: Diagnosis not present

## 2019-08-20 NOTE — Patient Instructions (Addendum)
You had a L ankle injection today.  Call or go to the ER if you develop a large red swollen joint with extreme pain or oozing puss.   Call or go to the ER if you develop a large red swollen joint with extreme pain or oozing puss.   Use the cam walker boot for a few weeks.   Continue the home exercises.   Let me know if not better.   Next step is probably the MRI.

## 2019-08-20 NOTE — Progress Notes (Signed)
   I, Wendy Poet, LAT, ATC, am serving as scribe for Dr. [name].  Natasha Fitzgerald is a 54 y.o. female who presents to [place] at [facility] today for f/u of her L medial ankle pain.  She was last seen by Dr. Georgina Snell on 07/02/19 and was provided w/ a HEP focusing on post tib eccentrics and calf stretching.  She was advised to purchase an ankle compression sleeve and to use Voltaren gel.  Since her last visit, pt reports that her L medial pain remains unchanged.  She states that she purchased a Body Helix and wears that consistently.  She has been doing her HEP on a regular basis.  Diagnostic testing: L foot XR- 07/02/19  Pertinent review of systems: No fevers or chills  Relevant historical information: Hypertension   Exam:  BP 130/86 (BP Location: Right Arm, Patient Position: Sitting, Cuff Size: Normal)   Pulse 89   Ht 5\' 3"  (1.6 m)   Wt 192 lb 3.2 oz (87.2 kg)   LMP 12/26/2012   SpO2 98%   BMI 34.05 kg/m  General: Well Developed, well nourished, and in no acute distress.   MSK: Left foot normal-appearing tender palpation near tarsal tunnel posterior tibialis tendon. Normal strength. Pulses cap refill and sensation are intact distally.    Lab and Radiology Results Procedure: Real-time Ultrasound Guided Injection of left foot posterior tibialis tendon sheath tarsal tunnel Device: Philips Affiniti 50G Images permanently stored and available for review in the ultrasound unit. Verbal informed consent obtained.  Discussed risks and benefits of procedure. Warned about infection bleeding damage to structures skin hypopigmentation and fat atrophy, tendon rupture among others. Patient expresses understanding and agreement Time-out conducted.   Noted no overlying erythema, induration, or other signs of local infection.   Skin prepped in a sterile fashion.   Local anesthesia: Topical Ethyl chloride.   With sterile technique and under real time ultrasound  guidance:  40 mg of Kenalog and 2 mL of Marcaine injected easily.   Completed without difficulty   Pain immediately resolved suggesting accurate placement of the medication.   Advised to call if fevers/chills, erythema, induration, drainage, or persistent bleeding.   Images permanently stored and available for review in the ultrasound unit.  Impression: Technically successful ultrasound guided injection.         Assessment and Plan: 54 y.o. female with left foot posterior tibialis tendinitis.  Failing conservative management.  Plan for injection and home exercise program.  Continued compression sleeve.  Recommend using a cam walker boot for about 2 weeks to 3 weeks after the shot today.  If not improving patient will notify me we will proceed with MRI.    Orders Placed This Encounter  Procedures  . [place] LIMITED JOINT SPACE STRUCTURES LOW LEFT(NO LINKED CHARGES)    Order Specific Question:   Reason for Exam (SYMPTOM  OR DIAGNOSIS REQUIRED)    Answer:   L ankle pain    Order Specific Question:   Preferred imaging location?    Answer:   [place]   No orders of the defined types were placed in this encounter.    Discussed warning signs or symptoms. Please see discharge instructions. Patient expresses understanding.   The above documentation has been reviewed and is accurate and complete [name], M.D.

## 2019-08-28 ENCOUNTER — Ambulatory Visit (AMBULATORY_SURGERY_CENTER): Payer: Self-pay | Admitting: *Deleted

## 2019-08-28 ENCOUNTER — Other Ambulatory Visit: Payer: Self-pay

## 2019-08-28 ENCOUNTER — Encounter: Payer: Self-pay | Admitting: Gastroenterology

## 2019-08-28 VITALS — Ht 63.0 in | Wt 190.0 lb

## 2019-08-28 DIAGNOSIS — Z1211 Encounter for screening for malignant neoplasm of colon: Secondary | ICD-10-CM

## 2019-08-28 NOTE — Progress Notes (Signed)
Patient is here in-person for PV. Patient denies any allergies to eggs or soy. Patient denies any problems with anesthesia/sedation. Patient denies any oxygen use at home. Patient denies taking any diet/weight loss medications or blood thinners. Patient is not being treated for MRSA or C-diff. Patient is aware of our care-partner policy and OMBTD-97 safety protocol. EMMI education sent to [place].   COVID-19 vaccines completed per pt.

## 2019-09-09 ENCOUNTER — Telehealth: Payer: Self-pay | Admitting: Family Medicine

## 2019-09-09 ENCOUNTER — Other Ambulatory Visit: Payer: Self-pay

## 2019-09-09 ENCOUNTER — Encounter: Payer: Self-pay | Admitting: Family Medicine

## 2019-09-09 ENCOUNTER — Telehealth (INDEPENDENT_AMBULATORY_CARE_PROVIDER_SITE_OTHER): Payer: BC Managed Care – PPO | Admitting: Family Medicine

## 2019-09-09 VITALS — Temp 97.3°F | Ht 63.0 in | Wt 189.0 lb

## 2019-09-09 DIAGNOSIS — N950 Postmenopausal bleeding: Secondary | ICD-10-CM

## 2019-09-09 DIAGNOSIS — R197 Diarrhea, unspecified: Secondary | ICD-10-CM

## 2019-09-09 NOTE — Progress Notes (Signed)
Patient ID: Natasha Fitzgerald, female   DOB: August 01, 1965, 54 y.o.   MRN: [id number]   This visit type was conducted due to national recommendations for restrictions regarding the COVID-19 pandemic in an effort to limit this patient's exposure and mitigate transmission in our community.   Virtual Visit via Telephone Note  I connected with Natasha Fitzgerald on 09/09/19 at  4:00 PM EDT by telephone and verified that I am speaking with the correct person using two identifiers.   I discussed the limitations, risks, security and privacy concerns of performing an evaluation and management service by telephone and the availability of in person appointments. I also discussed with the patient that there may be a patient responsible charge related to this service. The patient expressed understanding and agreed to proceed.  Location patient: home Location provider: work or home office Participants present for the call: patient, provider Patient did not have a visit in the prior 7 days to address this/these issue(s).   History of Present Illness:  [name] had called with some postmenopausal spotting past couple days.  She noticed a bit of blood on her underwear.  She was fairly certain this came from her vagina- and not rectal area.  She has not had any gross hematuria.  No blood in her stools.  She is actually scheduled to get colonoscopy this Friday.  She was initially scheduled in office today but the reports that she had diarrhea this morning.  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  Patient relates that she had changed her diet this week in preparation for the colonoscopy and ate some clam chowder last night.  She has had prior issues with lactose intolerance and she had one episode of diarrhea this morning which she felt was dietary related.  She has otherwise felt fine with no fever and no respiratory symptoms whatsoever.  No known sick contacts.  She  underwent menopause around age 74.  She has had no spotting whatsoever until couple days ago.  Last Pap smear was approximately 5 years ago.  She had no blood in her stools and no blood with wiping.  No pelvic pain.  Past Medical History:  Diagnosis Date  . Anxiety   . Chronic headaches 09/28/2009  . DEPRESSION 09/28/2009  . Fibromyalgia   . History of kidney stones   . HYPERTENSION 09/28/2009  . IRREGULAR MENSES 09/28/2009  . RUQ pain   . Thyroid disease    Past Surgical History:  Procedure Laterality Date  . CESAREAN SECTION  1997  . TONSILLECTOMY  1973    reports that she has never smoked. She has never used smokeless tobacco. She reports current alcohol use. She reports that she does not use drugs. family history includes Breast cancer in her maternal grandmother; Heart disease in her maternal grandfather and paternal grandfather; Hypertension in her mother. Allergies  Allergen Reactions  . Doxycycline     "headache, irritability, upset stomach"  . Penicillins     REACTION: hives      Observations/Objective: Patient sounds cheerful and well on the phone. I do not appreciate any SOB. Speech and thought processing are grossly intact. Patient reported vitals:  Assessment and Plan:  #1 postmenopausal bleeding.  She is approximately 5 years out from her last normal menstrual period.  This will need further evaluation.  -We recommended going ahead with GYN referral.  We explained that we could do basic pelvic exam here but this sounds  like clearly vaginal bleeding and will need more definitive work-up -GYN referral made  #2 singular episode of diarrhea this morning.  Possibly related to dietary consumption of clam chowder which she has not tolerated in the past frequently with dairy.  If she has any further diarrhea or other new symptoms (eg respiratory) recommend Covid testing.  Follow Up Instructions:  - as above.   GYN referral made.   [id number] 5-10 [id number] 11-20 [id number]  21-30 I did not refer this patient for an OV in the next 24 hours for this/these issue(s).  I discussed the assessment and treatment plan with the patient. The patient was provided an opportunity to ask questions and all were answered. The patient agreed with the plan and demonstrated an understanding of the instructions.   The patient was advised to call back or seek an in-person evaluation if the symptoms worsen or if the condition fails to improve as anticipated.  I provided 22 minutes of non-face-to-face time during this encounter.   Carolann Littler, MD

## 2019-09-09 NOTE — Telephone Encounter (Signed)
This has been taking care of. Pt has been scheduled for VV.

## 2019-09-09 NOTE — Telephone Encounter (Signed)
The patient called this morning because she has an in office appointment this afternoon with Dr. Elease Hashimoto at 4 PM and this morning she started having diarrhea after eating clam chowder from [facility] last night.   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 The Principle wants her to get a COVID test because of protocols.   The patient was wanting to know if she can still come in the office this afternoon.  Please advise

## 2019-09-11 ENCOUNTER — Ambulatory Visit (AMBULATORY_SURGERY_CENTER): Payer: BC Managed Care – PPO | Admitting: Gastroenterology

## 2019-09-11 ENCOUNTER — Encounter: Payer: Self-pay | Admitting: Gastroenterology

## 2019-09-11 ENCOUNTER — Other Ambulatory Visit: Payer: Self-pay

## 2019-09-11 VITALS — BP 130/68 | HR 70 | Temp 97.8°F | Resp 22 | Ht 63.0 in | Wt 190.0 lb

## 2019-09-11 DIAGNOSIS — D124 Benign neoplasm of descending colon: Secondary | ICD-10-CM

## 2019-09-11 DIAGNOSIS — D123 Benign neoplasm of transverse colon: Secondary | ICD-10-CM

## 2019-09-11 DIAGNOSIS — Z1211 Encounter for screening for malignant neoplasm of colon: Secondary | ICD-10-CM | POA: Diagnosis not present

## 2019-09-11 MED ORDER — SODIUM CHLORIDE 0.9 % IV SOLN: 500.0000 mL | Freq: Once | INTRAVENOUS | Status: DC

## 2019-09-11 NOTE — Progress Notes (Signed)
Pt Drowsy. VSS. To PACU, report to RN. No anesthetic complications noted.  

## 2019-09-11 NOTE — Progress Notes (Signed)
Called to room to assist during endoscopic procedure.  Patient ID and intended procedure confirmed with present staff. Received instructions for my participation in the procedure from the performing physician.  

## 2019-09-11 NOTE — Op Note (Signed)
[facility] Patient Name: Natasha Fitzgerald Procedure Date: 09/11/2019 8:27 AM MRN: [id number] Endoscopist: Milus Banister , MD Age: 54 Referring MD:  Date of Birth: May 28, 1965 Gender: Female Account #: [id number] Procedure:                Colonoscopy Indications:              Screening for colorectal malignant neoplasm Medicines:                Monitored Anesthesia Care Procedure:                Pre-Anesthesia Assessment:                           - Prior to the procedure, a History and Physical                            was performed, and patient medications and                            allergies were reviewed. The patient's tolerance of                            previous anesthesia was also reviewed. The risks                            and benefits of the procedure and the sedation                            options and risks were discussed with the patient.                            All questions were answered, and informed consent                            was obtained. Prior Anticoagulants: The patient has                            taken no previous anticoagulant or antiplatelet                            agents. ASA Grade Assessment: II - A patient with                            mild systemic disease. After reviewing the risks                            and benefits, the patient was deemed in                            satisfactory condition to undergo the procedure.                           After obtaining informed consent, the colonoscope  was passed under direct vision. Throughout the                            procedure, the patient's blood pressure, pulse, and                            oxygen saturations were monitored continuously. The                            Colonoscope was introduced through the anus and                            advanced to the the cecum, identified by                            appendiceal orifice and  ileocecal valve. The                            colonoscopy was performed without difficulty. The                            patient tolerated the procedure well. The quality                            of the bowel preparation was good. The ileocecal                            valve, appendiceal orifice, and rectum were                            photographed. Scope In: 8:38:38 AM Scope Out: 8:53:33 AM Scope Withdrawal Time: 0 hours 12 minutes 23 seconds  Total Procedure Duration: 0 hours 14 minutes 55 seconds  Findings:                 Two sessile polyps were found in the descending                            colon and transverse colon. The polyps were 1 to 2                            mm in size. These polyps were removed with a cold                            snare. Resection and retrieval were complete.                           The exam was otherwise without abnormality on                            direct and retroflexion views. Complications:            No immediate complications. Estimated blood loss:  None. Estimated Blood Loss:     Estimated blood loss: none. Impression:               - Two 1 to 2 mm polyps in the descending colon and                            in the transverse colon, removed with a cold snare.                            Resected and retrieved.                           - The examination was otherwise normal on direct                            and retroflexion views. Recommendation:           - Patient has a contact number available for                            emergencies. The signs and symptoms of potential                            delayed complications were discussed with the                            patient. Return to normal activities tomorrow.                            Written discharge instructions were provided to the                            patient.                           - Resume previous diet.                            - Continue present medications.                           - Await pathology results. Milus Banister, MD 09/11/2019 8:55:15 AM This report has been signed electronically.

## 2019-09-11 NOTE — Patient Instructions (Signed)
Information on polyps given to you today.  Await pathology results.  Resume previous diet and medications.  YOU HAD AN ENDOSCOPIC PROCEDURE TODAY AT THE [facility]:   Refer to the procedure report that was given to you for any specific questions about what was found during the examination.  If the procedure report does not answer your questions, please call your gastroenterologist to clarify.  If you requested that your care partner not be given the details of your procedure findings, then the procedure report has been included in a sealed envelope for you to review at your convenience later.  YOU SHOULD EXPECT: Some feelings of bloating in the abdomen. Passage of more gas than usual.  Walking can help get rid of the air that was put into your GI tract during the procedure and reduce the bloating. If you had a lower endoscopy (such as a colonoscopy or flexible sigmoidoscopy) you may notice spotting of blood in your stool or on the toilet paper. If you underwent a bowel prep for your procedure, you may not have a normal bowel movement for a few days.  Please Note:  You might notice some irritation and congestion in your nose or some drainage.  This is from the oxygen used during your procedure.  There is no need for concern and it should clear up in a day or so.  SYMPTOMS TO REPORT IMMEDIATELY:   Following lower endoscopy (colonoscopy or flexible sigmoidoscopy):  Excessive amounts of blood in the stool  Significant tenderness or worsening of abdominal pains  Swelling of the abdomen that is new, acute  Fever of 100F or higher   For urgent or emergent issues, a gastroenterologist can be reached at any hour by calling [phone]. Do not use MyChart messaging for urgent concerns.    DIET:  We do recommend a small meal at first, but then you may proceed to your regular diet.  Drink plenty of fluids but you should avoid alcoholic beverages for 24 hours.  ACTIVITY:  You should  plan to take it easy for the rest of today and you should NOT DRIVE or use heavy machinery until tomorrow (because of the sedation medicines used during the test).    FOLLOW UP: Our staff will call the number listed on your records 48-72 hours following your procedure to check on you and address any questions or concerns that you may have regarding the information given to you following your procedure. If we do not reach you, we will leave a message.  We will attempt to reach you two times.  During this call, we will ask if you have developed any symptoms of COVID 19. If you develop any symptoms (ie: fever, flu-like symptoms, shortness of breath, cough etc.) before then, please call [phone].  If you test positive for Covid 19 in the 2 weeks post procedure, please call and report this information to us.    If any biopsies were taken you will be contacted by phone or by letter within the next 1-3 weeks.  Please call us at [phone] if you have not heard about the biopsies in 3 weeks.    SIGNATURES/CONFIDENTIALITY: You and/or your care partner have signed paperwork which will be entered into your electronic medical record.  These signatures attest to the fact that that the information above on your After Visit Summary has been reviewed and is understood.  Full responsibility of the confidentiality of this discharge information lies with you and/or your care-partner. 

## 2019-09-11 NOTE — Progress Notes (Signed)
Pt's states no medical or surgical changes since previsit or office visit. 

## 2019-09-15 ENCOUNTER — Telehealth: Payer: Self-pay | Admitting: *Deleted

## 2019-09-15 ENCOUNTER — Telehealth: Payer: Self-pay

## 2019-09-15 NOTE — Telephone Encounter (Signed)
°  Follow up Call-  Call back number 09/11/2019  Post procedure Call Back phone  # [phone]  Permission to leave phone message Yes  Some recent data might be hidden     No answer at 2nd attempt follow up phone call.  Left message on voicemail.

## 2019-09-15 NOTE — Telephone Encounter (Signed)
1st follow up call made.  NALM 

## 2019-09-16 ENCOUNTER — Encounter: Payer: Self-pay | Admitting: Gastroenterology

## 2019-11-03 ENCOUNTER — Other Ambulatory Visit: Payer: Self-pay | Admitting: Family Medicine

## 2019-11-13 ENCOUNTER — Other Ambulatory Visit: Payer: Self-pay | Admitting: Family Medicine

## 2020-01-05 ENCOUNTER — Other Ambulatory Visit: Payer: Self-pay | Admitting: Family Medicine

## 2020-01-06 ENCOUNTER — Telehealth (INDEPENDENT_AMBULATORY_CARE_PROVIDER_SITE_OTHER): Payer: BC Managed Care – PPO | Admitting: Family Medicine

## 2020-01-06 DIAGNOSIS — U071 COVID-19: Secondary | ICD-10-CM | POA: Diagnosis not present

## 2020-01-06 NOTE — Progress Notes (Signed)
Patient ID: Natasha Fitzgerald, female   DOB: Nov 15, 1965, 55 y.o.   MRN: [id number]  This visit type was conducted due to national recommendations for restrictions regarding the COVID-19 pandemic in an effort to limit this patient's exposure and mitigate transmission in our community.   Virtual Visit via Telephone Note  I connected with Florence Canner on 01/06/20 at  4:15 PM EST by telephone and verified that I am speaking with the correct person using two identifiers.   I discussed the limitations, risks, security and privacy concerns of performing an evaluation and management service by telephone and the availability of in person appointments. I also discussed with the patient that there may be a patient responsible charge related to this service. The patient expressed understanding and agreed to proceed.  Location patient: home Location provider: work or home office Participants present for the call: patient, provider Patient did not have a visit in the prior 7 days to address this/these issue(s).   History of Present Illness: She recalled with recent Covid diagnosis.  She was diagnosed last Thursday.  She had onset of symptoms December 25.  She had some nasal congestion, loss of taste and smell, cough, body aches, fatigue.  Her symptoms have been relatively mild.  No dyspnea.  No nausea or vomiting.  She has had decreased appetite and has lost little bit of weight but keeping down fluids well.  Overall though she feels she is recovering.  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  She is past period of isolation.  Husband tested negative.  Her daughter also tested negative.  She did have her first 2 vaccines but did not get booster yet but she does plan to get this later  Past Medical History:  Diagnosis Date  . Anxiety   . Chronic headaches 09/28/2009  . DEPRESSION 09/28/2009  . Fibromyalgia   . History of kidney stones   . HYPERTENSION 09/28/2009  . IRREGULAR MENSES  09/28/2009  . RUQ pain   . Thyroid disease    Past Surgical History:  Procedure Laterality Date  . CESAREAN SECTION  1997  . TONSILLECTOMY  1973    reports that she has never smoked. She has never used smokeless tobacco. She reports current alcohol use. She reports that she does not use drugs. family history includes Breast cancer in her maternal grandmother; Heart disease in her maternal grandfather and paternal grandfather; Hypertension in her mother. Allergies  Allergen Reactions  . Doxycycline     "headache, irritability, upset stomach"  . Penicillins     REACTION: hives      Observations/Objective: Patient sounds cheerful and well on the phone. I do not appreciate any SOB. Speech and thought processing are grossly intact. Patient reported vitals:  Assessment and Plan:  COVID-19 infection.  Patient stable symptomatically and gradually improving  -Continue plenty of fluids and rest -Based on date of onset she has passed period of isolation -She is aware that residual symptoms such as loss of taste and smell may last for months  Follow Up Instructions:  -As above   99441 5-10 [id number] 11-20 99443 21-30 I did not refer this patient for an OV in the next 24 hours for this/these issue(s).  I discussed the assessment and treatment plan with the patient. The patient was provided an opportunity to ask questions and all were answered. The patient agreed with the plan and demonstrated an understanding of the instructions.   The patient was advised to  call back or seek an in-person evaluation if the symptoms worsen or if the condition fails to improve as anticipated.  I provided 14 minutes of non-face-to-face time during this encounter.   Evelena Peat, MD

## 2020-01-22 ENCOUNTER — Encounter: Payer: Self-pay | Admitting: Family Medicine

## 2020-01-22 MED ORDER — HYDROCODONE-HOMATROPINE 5-1.5 MG/5ML PO SYRP: 5.0000 mL | ORAL_SOLUTION | Freq: Four times a day (QID) | ORAL | 0 refills | Status: AC | PRN

## 2020-01-22 NOTE — Telephone Encounter (Signed)
I sent in Hycodan cough syrup to use up to every 6 hours as needed.  May cause some sedation.

## 2020-03-02 NOTE — Progress Notes (Signed)
I, Natasha Fitzgerald, LAT, ATC, am serving as scribe for Dr. [name].  Natasha Fitzgerald is a 55 y.o. female who presents to [place] at [facility] today for f/u of L ankle pain. Pt was last seen by Dr. Georgina Snell on 08/20/19 and was given a steroid injection was advised to use a CAM walker boot for 2-3 weeks following the injection.  Today, pt reports that the injection helped for a few months but for the last month, pt states that it feels like someone is hitting her medial mid-rearfoot w/ a hammer.  She has purchased an ankle Body Helix.  She does have a Cam walking boot but she has not started wearing that again.  She has tried to do her HEP but is not able to do them right now due to it being too painful.  She is currently using her ankle Body Helix, icing and elevating.  Aggravates: Treatments tried: HEP, ankle compressive sleeve, Voltaren, steroid injection  Dx imaging: 07/02/19 L foot XR  Pertinent review of systems: No fevers or chills  Relevant historical information: Dealer. Hypertension   Exam:  BP 130/80 (BP Location: Right Arm, Patient Position: Sitting, Cuff Size: Normal)   Pulse 92   Ht 5\' 3"  (1.6 m)   Wt 193 lb 6.4 oz (87.7 kg)   LMP 12/26/2012   SpO2 97%   BMI 34.26 kg/m  General: Well Developed, well nourished, and in no acute distress.   MSK: Left ankle tender palpation along the course of posterior tibialis tendon. Normal ankle motion.    Lab and Radiology Results  Procedure: Real-time Ultrasound Guided Injection of posterior tibialis tendon sheath left ankle Device: Philips Affiniti 50G Images permanently stored and available for review in PACS Verbal informed consent obtained.  Discussed risks and benefits of procedure. Warned about infection bleeding damage to structures skin hypopigmentation and fat atrophy among others. Patient expresses understanding and agreement Time-out conducted.   Noted no overlying  erythema, induration, or other signs of local infection.   Skin prepped in a sterile fashion.   Local anesthesia: Topical Ethyl chloride.   With sterile technique and under real time ultrasound guidance:  40 mg of Kenalog and 2 mL of Marcaine injected into posterior tibialis tendon sheath. Fluid seen entering the tendon sheath.   Completed without difficulty   Pain immediately resolved suggesting accurate placement of the medication.   Advised to call if fevers/chills, erythema, induration, drainage, or persistent bleeding.   Images permanently stored and available for review in the ultrasound unit.  Impression: Technically successful ultrasound guided injection.         Assessment and Plan: 55 y.o. female with left ankle pain medially due to posterior tibialis tendinitis. Last injection was over 6 months ago. Plan for repeat injection today and referral to physical therapy. Encourage patient to use cam walker boot for a few weeks due to risk of tendon rupture with steroid injections in her tendon. Recheck in 6 weeks.   PDMP not reviewed this encounter. Orders Placed This Encounter  Procedures  . [place] LIMITED JOINT SPACE STRUCTURES LOW LEFT(NO LINKED CHARGES)    Order Specific Question:   Reason for Exam (SYMPTOM  OR DIAGNOSIS REQUIRED)    Answer:   L ankle pain    Order Specific Question:   Preferred imaging location?    Answer:   [place]  . Ambulatory referral to Physical Therapy    Referral Priority:  Routine    Referral Type:   Physical Medicine    Referral Reason:   Specialty Services Required    Requested Specialty:   Physical Therapy   No orders of the defined types were placed in this encounter.    Discussed warning signs or symptoms. Please see discharge instructions. Patient expresses understanding.   The above documentation has been reviewed and is accurate and complete [name], M.D.

## 2020-03-03 ENCOUNTER — Encounter: Payer: Self-pay | Admitting: Family Medicine

## 2020-03-03 ENCOUNTER — Ambulatory Visit: Payer: Self-pay

## 2020-03-03 ENCOUNTER — Other Ambulatory Visit: Payer: Self-pay

## 2020-03-03 ENCOUNTER — Ambulatory Visit: Payer: BC Managed Care – PPO | Admitting: Family Medicine

## 2020-03-03 VITALS — BP 130/80 | HR 92 | Ht 63.0 in | Wt 193.4 lb

## 2020-03-03 DIAGNOSIS — M76822 Posterior tibial tendinitis, left leg: Secondary | ICD-10-CM | POA: Diagnosis not present

## 2020-03-03 NOTE — Patient Instructions (Signed)
Thank you for coming in today.  I've referred you to Physical Therapy.  Let us know if you don't hear from them in one week.  Call or go to the ER if you develop a large red swollen joint with extreme pain or oozing puss.   Use the cam walker boot  Recheck in 6 weeks.

## 2020-03-13 ENCOUNTER — Other Ambulatory Visit: Payer: Self-pay | Admitting: Family Medicine

## 2020-03-22 ENCOUNTER — Other Ambulatory Visit: Payer: Self-pay

## 2020-03-22 ENCOUNTER — Encounter: Payer: Self-pay | Admitting: Rehabilitative and Restorative Service Providers"

## 2020-03-22 ENCOUNTER — Ambulatory Visit: Payer: BC Managed Care – PPO | Admitting: Rehabilitative and Restorative Service Providers"

## 2020-03-22 DIAGNOSIS — M25572 Pain in left ankle and joints of left foot: Secondary | ICD-10-CM | POA: Diagnosis not present

## 2020-03-22 DIAGNOSIS — M6281 Muscle weakness (generalized): Secondary | ICD-10-CM

## 2020-03-22 DIAGNOSIS — R262 Difficulty in walking, not elsewhere classified: Secondary | ICD-10-CM

## 2020-03-22 NOTE — Patient Instructions (Signed)
Access Code: [email or web address] URL: [email or web address] Date: 03/22/2020 Prepared by: Scot Jun  Exercises Gastroc Stretch on Wall - 2 x daily - 7 x weekly - 1 sets - 5 reps - 30 hold Long Sitting Ankle Plantar Flexion with Resistance - 2 x daily - 7 x weekly - 3 sets - 10 reps Long Sitting Ankle Inversion with Resistance - 2 x daily - 7 x weekly - 3 sets - 10 reps Long Sitting Ankle Eversion with Resistance - 2 x daily - 7 x weekly - 3 sets - 10 reps

## 2020-03-22 NOTE — Therapy (Signed)
[facility] [address] [place], [place], [address] Phone: [phone]   Fax:  [phone]  Physical Therapy Evaluation  Patient Details  Name: Natasha Fitzgerald MRN: [id number] Date of Birth: 1965/07/25 Referring Provider (PT): Dr. [name]   Encounter Date: 03/22/2020   PT End of Session - 03/22/20 1605    Visit Number 1    Number of Visits 12    Date for PT Re-Evaluation 05/17/20    Progress Note Due on Visit 10    PT Start Time 1518    PT Stop Time 1555    PT Time Calculation (min) 37 min    Activity Tolerance Patient tolerated treatment well    Behavior During Therapy [facility] for tasks assessed/performed           Past Medical History:  Diagnosis Date  . Anxiety   . Chronic headaches 09/28/2009  . DEPRESSION 09/28/2009  . Fibromyalgia   . History of kidney stones   . HYPERTENSION 09/28/2009  . IRREGULAR MENSES 09/28/2009  . RUQ pain   . Thyroid disease     Past Surgical History:  Procedure Laterality Date  . CESAREAN SECTION  1997  . TONSILLECTOMY  1973    There were no vitals filed for this visit.    Subjective Assessment - 03/22/20 1523    Subjective Pt. has complaints of medial Lt ankle/foot pain.  Pt. 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  Pt. has history of 2 injections with some help.  Has had walker boot as well in time off and on.  Pt. stated symptom onset approx. 1 year ago. Pt. stated some improvement was noted but has return more recently.    Pertinent History bilateral knee pain at times, fibromyalgia, HTN    Limitations Walking;Standing    Patient Stated Goals Reduce pain    Currently in Pain? Yes    Pain Score 3    pain at worst 10/10   Pain Location Ankle    Pain Orientation Left;Medial    Pain Descriptors / Indicators Other (Comment);Constant   hammer like pain   Pain Type Chronic pain    Pain Onset More than a month ago    Pain Frequency Constant     Aggravating Factors  walking, standing, constant at times    Pain Relieving Factors injections helped some    Effect of Pain on Daily Activities Limited in walking, standing, uneven surface/hills              [facility] PT Assessment - 03/22/20 0001      Assessment   Medical Diagnosis Posterior tibial tendinitis Lt    Referring Provider (PT) Dr. [name]    Onset Date/Surgical Date 04/02/19   approx. 1 year   Hand Dominance Right      Precautions   Precautions None      Restrictions   Weight Bearing Restrictions No      Balance Screen   Has the patient fallen in the past 6 months No    Has the patient had a decrease in activity level because of a fear of falling?  No    Is the patient reluctant to leave their home because of a fear of falling?  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    Leisure Art, gardening, ball toss for dog, exercise routine goal      Cognition   Overall Cognitive Status Within Functional Limits for tasks assessed      Observation/Other Assessments   Focus on Therapeutic Outcomes (FOTO)  intake 40%, predicted 56%      Functional Tests   Functional tests Single leg stance;Sit to Stand      Single Leg Stance   Comments Rt SLS 5 seconds, Lt SLS < 2 seconds c pain      ROM / Strength   AROM / PROM / Strength Strength;PROM;AROM      AROM   Overall AROM Comments seated 90 deg knee flexion positioning    AROM Assessment Site Ankle    Right/Left Ankle Right;Left    Right Ankle Dorsiflexion 12    Right Ankle Plantar Flexion 56    Right Ankle Inversion 30    Right Ankle Eversion 29    Left Ankle Dorsiflexion 13    Left Ankle Plantar Flexion 46    Left Ankle Inversion 14    Left Ankle Eversion 20      PROM   Overall PROM Comments overpessure  revealed no change compared to AROM    PROM Assessment Site Ankle    Right/Left Ankle Left;Right      Strength   Overall Strength Comments Mild pain c eversion, inversion MMT    Strength Assessment Site Hip;Knee;Ankle    Right/Left Hip Left;Right    Right Hip Flexion 5/5    Left Hip Flexion 5/5    Right/Left Knee Left;Right    Right Knee Flexion 5/5    Right Knee Extension 5/5    Left Knee Flexion 5/5    Left Knee Extension 5/5    Right/Left Ankle Left;Right    Right Ankle Dorsiflexion 5/5    Right Ankle Inversion 5/5    Right Ankle Eversion 5/5    Left Ankle Dorsiflexion 5/5    Left Ankle Plantar Flexion 2+/5   unable to perform single leg calf raise Lt in standing   Left Ankle Inversion 4/5    Left Ankle Eversion 5/5      Palpation   Palpation comment tenderness posterior tibialis tendons posterior to malleolus, mild edema noted in area      Ambulation/Gait   Ambulation/Gait Yes    Ambulation/Gait Assistance 7: Independent    Gait Pattern Decreased dorsiflexion - left;Decreased stance time - left;Antalgic   Mild Rt LE ER, calcaneal eversion in stance on Lt vs. Rt in shoe ambulation                     Objective measurements completed on examination: See above findings.       [place] [facility] PT Treatment/Exercise - 03/22/20 0001      Exercises   Exercises Other Exercises;Knee/Hip    Other Exercises  HEP instruction/performance c cues for techniques, handout provided.  Trial set of each exercise performed including tband eccentric focus eversion, inversion, pf x 10 each green band, gastroc runner stretch at wall 30 sec c towel in medial arch                  PT Education - 03/22/20 1515    Education Details HEP, POC, ionto education    Person(s) Educated Patient    Methods Explanation;Demonstration;Verbal cues;Handout    Comprehension Returned demonstration;Verbalized understanding  PT Short Term Goals - 03/22/20 1515      PT SHORT  TERM GOAL #1   Title Patient will demonstrate independent use of home exercise program to maintain progress from in clinic treatments.    Time 3    Period Weeks    Status New    Target Date 04/12/20             PT Long Term Goals - 03/22/20 1515      PT LONG TERM GOAL #1   Title Patient will demonstrate/report pain at worst less than or equal to 2/10 to facilitate minimal limitation in daily activity secondary to pain symptoms.    Time 8    Period Weeks    Status New    Target Date 05/17/20      PT LONG TERM GOAL #2   Title Patient will demonstrate independent use of home exercise program to facilitate ability to maintain/progress functional gains from skilled physical therapy services.    Time 8    Period Weeks    Status New    Target Date 05/17/20      PT LONG TERM GOAL #3   Title Patient will demonstrate Lt LE MMT 5/5 throughout to facilitate ability to perform usual standing, walking, stairs at PLOF s limitation due to symptoms.    Time 8    Period Weeks    Status New    Target Date 05/17/20      PT LONG TERM GOAL #4   Title Pt. will demonstrate FOTO > = 56 to indicated reduced disability due to condition.    Time 8    Period Weeks    Status New    Target Date 05/17/20      PT LONG TERM GOAL #5   Title Pt. will demonstrate Lt ankle AROM equal to Rt s symptoms for usual ambulation mobility at PLOF.    Time 8    Period Weeks    Status New    Target Date 05/17/20      Additional Long Term Goals   Additional Long Term Goals Yes      PT LONG TERM GOAL #6   Title Pt. will demonstrate bilateral SLS > 15 seconds to facilitate ability to perform uneven ground ambulation at PLOF.    Time 8    Period Weeks    Status New    Target Date 05/17/20      PT LONG TERM GOAL #7   Title Pt. will demonstrate/report ability to ascend descend stairs reciprocal gait pattern s rail for household entry.    Time 8    Period Weeks    Status New    Target Date 05/17/20                   Plan - 03/22/20 1516    Clinical Impression Statement Patient is a 55 y.o. who comes to clinic with complaints of Lt ankle/leg pain with mobility, strength and movement coordination deficits that impair their ability to perform usual daily and recreational functional activities without increase difficulty/symptoms at this time.  Patient to benefit from skilled PT services to address impairments and limitations to improve to previous level of function without restriction secondary to condition.    Personal Factors and Comorbidities Comorbidity 3+    Comorbidities Recent COVID 01/2020, HTN, fibromyalgia    Examination-Activity Limitations Sit;Squat;Stairs;Stand;Locomotion Level;Transfers    Examination-Participation Restrictions Occupation;Community Activity;[place] [employer] Work;Meal Prep;Laundry;School;Cleaning    Stability/Clinical Decision Making Stable/Uncomplicated  Clinical Decision Making Low    Rehab Potential Good    PT Frequency --   1-2x/week   PT Duration 8 weeks    PT Treatment/Interventions ADLs/Self Care Home Management;Cryotherapy;Electrical Stimulation;Iontophoresis 4mg /ml Dexamethasone;Moist Heat;Balance training;Therapeutic exercise;Therapeutic activities;Functional mobility training;Gait training;Stair training;DME Instruction;Ultrasound;Neuromuscular re-education;Patient/family education;Passive range of motion;Spinal Manipulations;Joint Manipulations;Dry needling;Taping;Vasopneumatic Device;Manual techniques    PT Next Visit Plan Review HEP, possible ionto use, eccentric inversion, PF strengthening    PT Home Exercise Plan [email or web address]    Consulted and Agree with Plan of Care Patient           Patient will benefit from skilled therapeutic intervention in order to improve the following deficits and impairments:  Abnormal gait,Decreased endurance,Hypomobility,Increased edema,Decreased activity tolerance,Decreased strength,Pain,Difficulty walking,Decreased mobility,Decreased  balance,Decreased range of motion,Impaired perceived functional ability,Improper body mechanics,Impaired flexibility,Decreased coordination  Visit Diagnosis: Pain in left ankle and joints of left foot  Muscle weakness (generalized)  Difficulty in walking, not elsewhere classified     Problem List Patient Active Problem List   Diagnosis Date Noted  . GERD (gastroesophageal reflux disease) 03/27/2019  . Depression, recurrent ([place]) 11/25/2018  . Onychomycosis 07/06/2013  . Obesity (BMI 30-39.9) 07/06/2013  . Osteoarthritis, multiple sites 04/21/2012  . Hypothyroid 03/07/2011  . Biliary dyskinesia 07/04/2010  . Essential hypertension 09/28/2009  . IRREGULAR MENSES 09/28/2009  . Headache 09/28/2009    Scot Jun, PT, DPT, OCS, ATC 03/22/20  4:20 PM    [facility] [address] [place], [place], [address] Phone: [phone]   Fax:  [phone]  Name: Natasha Fitzgerald MRN: [id number] Date of Birth: August 02, 1965

## 2020-03-28 ENCOUNTER — Encounter: Payer: Self-pay | Admitting: Family Medicine

## 2020-04-06 ENCOUNTER — Ambulatory Visit: Payer: BC Managed Care – PPO | Admitting: Rehabilitative and Restorative Service Providers"

## 2020-04-06 ENCOUNTER — Encounter: Payer: Self-pay | Admitting: Rehabilitative and Restorative Service Providers"

## 2020-04-06 ENCOUNTER — Other Ambulatory Visit: Payer: Self-pay

## 2020-04-06 DIAGNOSIS — M25572 Pain in left ankle and joints of left foot: Secondary | ICD-10-CM | POA: Diagnosis not present

## 2020-04-06 DIAGNOSIS — R262 Difficulty in walking, not elsewhere classified: Secondary | ICD-10-CM

## 2020-04-06 DIAGNOSIS — M6281 Muscle weakness (generalized): Secondary | ICD-10-CM | POA: Diagnosis not present

## 2020-04-06 NOTE — Therapy (Signed)
[facility] Physical Therapy [address] [place], [place], [address] Phone: [phone]   Fax:  [phone]  Physical Therapy Treatment  Patient Details  Name: Natasha Fitzgerald MRN: [id number] Date of Birth: 1965-04-04 Referring Provider (PT): Dr. [name]   Encounter Date: 04/06/2020   PT End of Session - 04/06/20 1701    Visit Number 2    Number of Visits 12    Date for PT Re-Evaluation 05/17/20    Progress Note Due on Visit 10    PT Start Time 2297    PT Stop Time 1600    PT Time Calculation (min) 43 min    Activity Tolerance Patient tolerated treatment well;No increased pain    Behavior During Therapy WFL for tasks assessed/performed           Past Medical History:  Diagnosis Date  . Anxiety   . Chronic headaches 09/28/2009  . DEPRESSION 09/28/2009  . Fibromyalgia   . History of kidney stones   . HYPERTENSION 09/28/2009  . IRREGULAR MENSES 09/28/2009  . RUQ pain   . Thyroid disease     Past Surgical History:  Procedure Laterality Date  . CESAREAN SECTION  1997  . TONSILLECTOMY  1973    There were no vitals filed for this visit.   Subjective Assessment - 04/06/20 1611    Subjective Natasha Fitzgerald reports good compliance on a 1-2X/day basis (2+X/Day recommended).    Pertinent History bilateral knee pain at times, fibromyalgia, HTN    Limitations Walking;Standing    Patient Stated Goals Reduce pain    Currently in Pain? Yes    Pain Score 3     Pain Location Ankle    Pain Orientation Left;Medial    Pain Descriptors / Indicators Constant    Pain Type Chronic pain    Pain Onset More than a month ago    Pain Frequency Constant    Aggravating Factors  Standing at work and prolonged WB    Pain Relieving Factors Rest, exercises    Effect of Pain on Daily Activities Limits WB function, needs to sit and give the ankle a break    Multiple Pain Sites No                             [facility] Adult PT Treatment/Exercise - 04/06/20  0001      Exercises   Exercises Ankle      Knee/Hip Exercises: Stretches   Gastroc Stretch Both;5 reps;30 seconds    Gastroc Stretch Limitations Slight toe in    Other Knee/Hip Stretches Upper HC stretch on box 2X 1 minute slight toe in      Knee/Hip Exercises: Standing   Heel Raises Both;2 sets;10 reps;3 seconds;Other (comment)    Heel Raises Limitations Slow eccentrics      Knee/Hip Exercises: Seated   Other Seated Knee/Hip Exercises Ankle therband Inv/Ev 3 sets of 20 each with green theraband and slow eccentrics; 1 set of PF 20X slow eccentrics                  PT Education - 04/06/20 1700    Education Details Reviewed HEP, reinforced eccentrics with strengthening.  Added heel raises.    Person(s) Educated Patient    Methods Explanation;Demonstration;Verbal cues;Handout    Comprehension Verbal cues required;Need further instruction;Returned demonstration;Verbalized understanding            PT Short Term Goals - 04/06/20 1700  PT SHORT TERM GOAL #1   Title Patient will demonstrate independent use of home exercise program to maintain progress from in clinic treatments.    Time 3    Period Weeks    Status On-going    Target Date 04/12/20             PT Long Term Goals - 04/06/20 1700      PT LONG TERM GOAL #1   Title Patient will demonstrate/report pain at worst less than or equal to 2/10 to facilitate minimal limitation in daily activity secondary to pain symptoms.    Time 8    Period Weeks    Status On-going      PT LONG TERM GOAL #2   Title Patient will demonstrate independent use of home exercise program to facilitate ability to maintain/progress functional gains from skilled physical therapy services.    Time 8    Period Weeks    Status On-going      PT LONG TERM GOAL #3   Title Patient will demonstrate Lt LE MMT 5/5 throughout to facilitate ability to perform usual standing, walking, stairs at PLOF s limitation due to symptoms.    Time 8     Period Weeks    Status On-going      PT LONG TERM GOAL #4   Title Pt. will demonstrate FOTO > = 56 to indicated reduced disability due to condition.    Time 8    Period Weeks    Status On-going      PT LONG TERM GOAL #5   Title Pt. will demonstrate Lt ankle AROM equal to Rt s symptoms for usual ambulation mobility at PLOF.    Time 8    Period Weeks    Status On-going      PT LONG TERM GOAL #6   Title Pt. will demonstrate bilateral SLS > 15 seconds to facilitate ability to perform uneven ground ambulation at PLOF.    Time 8    Period Weeks    Status On-going      PT LONG TERM GOAL #7   Title Pt. will demonstrate/report ability to ascend descend stairs reciprocal gait pattern s rail for household entry.    Time 8    Period Weeks    Status On-going                 Plan - 04/06/20 1702    Clinical Impression Statement [name] reports good compliance with stretching and less so with theraband strengthening.  Showed her a seated inversion theraband option and standing heel raise she can do at work to help compliance and ankle strength.  Continue emphasis on strengthening, slow eccentrics and calf stretching to improve WB endurance and reduce pain.    Personal Factors and Comorbidities Comorbidity 3+    Comorbidities Recent COVID 01/2020, HTN, fibromyalgia    Examination-Activity Limitations Sit;Squat;Stairs;Stand;Locomotion Level;Transfers    Examination-Participation Restrictions Occupation;Community Activity;Yard Work;Meal Prep;Laundry;School;Cleaning    Stability/Clinical Decision Making Stable/Uncomplicated    Rehab Potential Good    PT Frequency --   1-2x/week   PT Duration 8 weeks    PT Treatment/Interventions ADLs/Self Care Home Management;Cryotherapy;Electrical Stimulation;Iontophoresis 4mg /ml Dexamethasone;Moist Heat;Balance training;Therapeutic exercise;Therapeutic activities;Functional mobility training;Gait training;Stair training;DME  Instruction;Ultrasound;Neuromuscular re-education;Patient/family education;Passive range of motion;Spinal Manipulations;Joint Manipulations;Dry needling;Taping;Vasopneumatic Device;Manual techniques    PT Next Visit Plan Review HEP, possible ionto use, eccentric inversion, PF strengthening    PT Home Exercise Plan [email or web address]    Consulted and Agree with Plan of Care  Patient           Patient will benefit from skilled therapeutic intervention in order to improve the following deficits and impairments:  Abnormal gait,Decreased endurance,Hypomobility,Increased edema,Decreased activity tolerance,Decreased strength,Pain,Difficulty walking,Decreased mobility,Decreased balance,Decreased range of motion,Impaired perceived functional ability,Improper body mechanics,Impaired flexibility,Decreased coordination  Visit Diagnosis: Difficulty in walking, not elsewhere classified  Muscle weakness (generalized)  Pain in left ankle and joints of left foot     Problem List Patient Active Problem List   Diagnosis Date Noted  . GERD (gastroesophageal reflux disease) 03/27/2019  . Depression, recurrent ([place]) 11/25/2018  . Onychomycosis 07/06/2013  . Obesity (BMI 30-39.9) 07/06/2013  . Osteoarthritis, multiple sites 04/21/2012  . Hypothyroid 03/07/2011  . Biliary dyskinesia 07/04/2010  . Essential hypertension 09/28/2009  . IRREGULAR MENSES 09/28/2009  . Headache 09/28/2009    Farley Ly PT, MPT 04/06/2020, 5:04 PM  [facility] Physical Therapy [address] [place], [place], [address] Phone: [phone]   Fax:  [phone]  Name: Natasha Fitzgerald MRN: [id number] Date of Birth: December 18, 1965

## 2020-04-06 NOTE — Patient Instructions (Signed)
Access Code: [email or web address] URL: [email or web address] Date: 04/06/2020 Prepared by: Vista Mink  Exercises Gastroc Stretch on Wall - 2 x daily - 7 x weekly - 1 sets - 5 reps - 30 hold Long Sitting Ankle Plantar Flexion with Resistance - 2 x daily - 7 x weekly - 3 sets - 10 reps Long Sitting Ankle Inversion with Resistance - 2 x daily - 7 x weekly - 3 sets - 10 reps Long Sitting Ankle Eversion with Resistance - 2 x daily - 7 x weekly - 3 sets - 10 reps Heel Raise - 5 x daily - 7 x weekly - 1 sets - 5-10 reps - 3 seconds hold

## 2020-04-13 ENCOUNTER — Ambulatory Visit: Payer: BC Managed Care – PPO | Admitting: Rehabilitative and Restorative Service Providers"

## 2020-04-13 ENCOUNTER — Encounter: Payer: Self-pay | Admitting: Rehabilitative and Restorative Service Providers"

## 2020-04-13 ENCOUNTER — Other Ambulatory Visit: Payer: Self-pay

## 2020-04-13 DIAGNOSIS — M6281 Muscle weakness (generalized): Secondary | ICD-10-CM | POA: Diagnosis not present

## 2020-04-13 DIAGNOSIS — R262 Difficulty in walking, not elsewhere classified: Secondary | ICD-10-CM

## 2020-04-13 DIAGNOSIS — M25572 Pain in left ankle and joints of left foot: Secondary | ICD-10-CM

## 2020-04-13 NOTE — Therapy (Signed)
[facility] Physical Therapy [address] [place], [place], [address] Phone: [phone]   Fax:  [phone]  Physical Therapy Treatment  Patient Details  Name: Natasha Fitzgerald MRN: [id number] Date of Birth: Sep 16, 1965 Referring Provider (PT): Dr. [name]   Encounter Date: 04/13/2020   PT End of Session - 04/13/20 1550    Visit Number 3    Number of Visits 12    Date for PT Re-Evaluation 05/17/20    Progress Note Due on Visit 10    PT Start Time 2725    PT Stop Time 3664    PT Time Calculation (min) 40 min    Activity Tolerance Patient tolerated treatment well;No increased pain    Behavior During Therapy WFL for tasks assessed/performed           Past Medical History:  Diagnosis Date  . Anxiety   . Chronic headaches 09/28/2009  . DEPRESSION 09/28/2009  . Fibromyalgia   . History of kidney stones   . HYPERTENSION 09/28/2009  . IRREGULAR MENSES 09/28/2009  . RUQ pain   . Thyroid disease     Past Surgical History:  Procedure Laterality Date  . CESAREAN SECTION  1997  . TONSILLECTOMY  1973    There were no vitals filed for this visit.   Subjective Assessment - 04/13/20 1520    Subjective Pt. stated feeling better and more sure on feet but today hurting more.  Felt she might be overdoing walking/standing.    Pertinent History bilateral knee pain at times, fibromyalgia, HTN    Limitations Walking;Standing    Patient Stated Goals Reduce pain    Currently in Pain? Yes    Pain Score 5     Pain Orientation Left;Medial    Pain Descriptors / Indicators Sharp    Pain Type Chronic pain    Pain Onset More than a month ago    Pain Frequency Intermittent    Aggravating Factors  standing/walking    Pain Relieving Factors sitting/rest              [facility] PT Assessment - 04/13/20 0001      Assessment   Medical Diagnosis Posterior tibial tendinitis Lt    Referring Provider (PT) Dr. [name]    Onset Date/Surgical Date 04/02/19    Hand  Dominance Right      AROM   Left Ankle Dorsiflexion 16    Left Ankle Inversion 16    Left Ankle Eversion 21                         [facility] Adult PT Treatment/Exercise - 04/13/20 0001      Neuro Re-ed    Neuro Re-ed Details  single leg stance 30 sec x 5 Lt, fitter board fwd/back 20x each way      Knee/Hip Exercises: Stretches   Gastroc Stretch 30 seconds;5 reps;Left   runner stretch on incline board     Knee/Hip Exercises: Standing   Heel Raises Left;20 reps   eccentric 75% on LLE for lowering     Knee/Hip Exercises: Seated   Other Seated Knee/Hip Exercises inversion, eversion green band eccentric focus x20 each      Manual Therapy   Manual therapy comments g2 medial/lateral subtalar jt mobs, ap mobs talocrural jt for Lt ankle                    PT Short Term Goals - 04/13/20 1550  PT SHORT TERM GOAL #1   Title Patient will demonstrate independent use of home exercise program to maintain progress from in clinic treatments.    Time 3    Period Weeks    Status Achieved    Target Date 04/12/20             PT Long Term Goals - 04/06/20 1700      PT LONG TERM GOAL #1   Title Patient will demonstrate/report pain at worst less than or equal to 2/10 to facilitate minimal limitation in daily activity secondary to pain symptoms.    Time 8    Period Weeks    Status On-going      PT LONG TERM GOAL #2   Title Patient will demonstrate independent use of home exercise program to facilitate ability to maintain/progress functional gains from skilled physical therapy services.    Time 8    Period Weeks    Status On-going      PT LONG TERM GOAL #3   Title Patient will demonstrate Lt LE MMT 5/5 throughout to facilitate ability to perform usual standing, walking, stairs at PLOF s limitation due to symptoms.    Time 8    Period Weeks    Status On-going      PT LONG TERM GOAL #4   Title Pt. will demonstrate FOTO > = 56 to indicated reduced disability  due to condition.    Time 8    Period Weeks    Status On-going      PT LONG TERM GOAL #5   Title Pt. will demonstrate Lt ankle AROM equal to Rt s symptoms for usual ambulation mobility at PLOF.    Time 8    Period Weeks    Status On-going      PT LONG TERM GOAL #6   Title Pt. will demonstrate bilateral SLS > 15 seconds to facilitate ability to perform uneven ground ambulation at PLOF.    Time 8    Period Weeks    Status On-going      PT LONG TERM GOAL #7   Title Pt. will demonstrate/report ability to ascend descend stairs reciprocal gait pattern s rail for household entry.    Time 8    Period Weeks    Status On-going                 Plan - 04/13/20 1537    Clinical Impression Statement Increased complaints of medial ankle symptoms today noted after increased standing/walking activity.  Tenderness to touch noted around deltoid ligament.  Joint mobility assessment WFL today for Lt ankle.  Fair performance on non compliant surface single leg stance.    Personal Factors and Comorbidities Comorbidity 3+    Comorbidities Recent COVID 01/2020, HTN, fibromyalgia    Examination-Activity Limitations Sit;Squat;Stairs;Stand;Locomotion Level;Transfers    Examination-Participation Restrictions Occupation;Community Activity;Yard Work;Meal Prep;Laundry;School;Cleaning    Stability/Clinical Decision Making Stable/Uncomplicated    Rehab Potential Good    PT Frequency --   1-2x/week   PT Duration 8 weeks    PT Treatment/Interventions ADLs/Self Care Home Management;Cryotherapy;Electrical Stimulation;Iontophoresis 4mg /ml Dexamethasone;Moist Heat;Balance training;Therapeutic exercise;Therapeutic activities;Functional mobility training;Gait training;Stair training;DME Instruction;Ultrasound;Neuromuscular re-education;Patient/family education;Passive range of motion;Spinal Manipulations;Joint Manipulations;Dry needling;Taping;Vasopneumatic Device;Manual techniques    PT Next Visit Plan Ionto use?,  eccentric inversion, PF strengthening, compliant surface balance    PT Home Exercise Plan [email or web address]    Consulted and Agree with Plan of Care Patient           Patient will  benefit from skilled therapeutic intervention in order to improve the following deficits and impairments:  Abnormal gait,Decreased endurance,Hypomobility,Increased edema,Decreased activity tolerance,Decreased strength,Pain,Difficulty walking,Decreased mobility,Decreased balance,Decreased range of motion,Impaired perceived functional ability,Improper body mechanics,Impaired flexibility,Decreased coordination  Visit Diagnosis: Pain in left ankle and joints of left foot  Muscle weakness (generalized)  Difficulty in walking, not elsewhere classified     Problem List Patient Active Problem List   Diagnosis Date Noted  . GERD (gastroesophageal reflux disease) 03/27/2019  . Depression, recurrent ([place]) 11/25/2018  . Onychomycosis 07/06/2013  . Obesity (BMI 30-39.9) 07/06/2013  . Osteoarthritis, multiple sites 04/21/2012  . Hypothyroid 03/07/2011  . Biliary dyskinesia 07/04/2010  . Essential hypertension 09/28/2009  . IRREGULAR MENSES 09/28/2009  . Headache 09/28/2009   Scot Jun, PT, DPT, OCS, ATC 04/13/20  3:54 PM    [facility] [address] [place], [place], [address] Phone: [phone]   Fax:  [phone]  Name: Natasha Fitzgerald MRN: [id number] Date of Birth: [birth year]

## 2020-04-14 ENCOUNTER — Ambulatory Visit: Payer: BC Managed Care – PPO | Admitting: Family Medicine

## 2020-04-20 ENCOUNTER — Encounter: Payer: BC Managed Care – PPO | Admitting: Rehabilitative and Restorative Service Providers"

## 2020-04-27 ENCOUNTER — Encounter: Payer: Self-pay | Admitting: Rehabilitative and Restorative Service Providers"

## 2020-04-27 ENCOUNTER — Ambulatory Visit: Payer: BC Managed Care – PPO | Admitting: Rehabilitative and Restorative Service Providers"

## 2020-04-27 ENCOUNTER — Other Ambulatory Visit: Payer: Self-pay

## 2020-04-27 DIAGNOSIS — R262 Difficulty in walking, not elsewhere classified: Secondary | ICD-10-CM | POA: Diagnosis not present

## 2020-04-27 DIAGNOSIS — M25572 Pain in left ankle and joints of left foot: Secondary | ICD-10-CM

## 2020-04-27 DIAGNOSIS — M6281 Muscle weakness (generalized): Secondary | ICD-10-CM | POA: Diagnosis not present

## 2020-04-27 NOTE — Therapy (Addendum)
[facility] Physical Therapy [address] [place], [place], [address] Phone: [phone]   Fax:  [phone]  Physical Therapy Treatment/Discharge  Patient Details  Name: Natasha Fitzgerald MRN: [id number] Date of Birth: April 27, 1965 Referring Provider (PT): Dr. [name]   Encounter Date: 04/27/2020   PT End of Session - 04/27/20 1540     Visit Number 4    Number of Visits 12    Date for PT Re-Evaluation 05/17/20    Progress Note Due on Visit 10    PT Start Time 1520    PT Stop Time 1600    PT Time Calculation (min) 40 min    Activity Tolerance Patient tolerated treatment well;No increased pain    Behavior During Therapy [facility] for tasks assessed/performed             Past Medical History:  Diagnosis Date   Anxiety    Chronic headaches 09/28/2009   DEPRESSION 09/28/2009   Fibromyalgia    History of kidney stones    HYPERTENSION 09/28/2009   IRREGULAR MENSES 09/28/2009   RUQ pain    Thyroid disease     Past Surgical History:  Procedure Laterality Date   [place]    There were no vitals filed for this visit.   Subjective Assessment - 04/27/20 1523     Subjective Pt. stated she felt like she was still over doing it but not as much pain as previous.  Pt. stated having trouble c heel raise c pain and balance deficits.    Pertinent History bilateral knee pain at times, fibromyalgia, HTN    Limitations Walking;Standing    Patient Stated Goals Reduce pain    Currently in Pain? Yes    Pain Score 2     Pain Location Ankle    Pain Orientation Left;Medial    Pain Descriptors / Indicators Sharp    Pain Type Chronic pain    Pain Onset More than a month ago    Aggravating Factors  standing, walking, difficulty c balance/PF activity    Pain Relieving Factors rest                [facility] PT Assessment - 04/27/20 0001       Assessment   Medical Diagnosis Posterior tibial tendinitis Lt    Referring Provider  (PT) Dr. [name]    Onset Date/Surgical Date 04/02/19    Hand Dominance Right      Single Leg Stance   Comments Lt SLS 5 seconds without hand assist                           [facility] Adult PT Treatment/Exercise - 04/27/20 0001       Neuro Re-ed    Neuro Re-ed Details  tandem ambulation on foam 8 ft x 5 fwd/back, fitter board fwd/back ankle control 30x each (occasional hand assist for both), tandem stance on foam 1 min x 2 bilateral      Knee/Hip Exercises: Stretches   Gastroc Stretch 3 reps;30 seconds;Left   incline board runner stretch Rt LE half on/off board     Knee/Hip Exercises: Standing   Heel Raises Left;2 sets;10 reps   eccentric lowering aprpox. 75% on Lt c additional time spent on cues     Modalities   Modalities Iontophoresis      Iontophoresis   Type of Iontophoresis Dexamethasone    Location Lt medial posterior tib  inferior/posterior medial malleolus    Dose 1.0 mL dose    Time 4-6 hr patch                      PT Short Term Goals - 04/13/20 1550       PT SHORT TERM GOAL #1   Title Patient will demonstrate independent use of home exercise program to maintain progress from in clinic treatments.    Time 3    Period Weeks    Status Achieved    Target Date 04/12/20               PT Long Term Goals - 04/06/20 1700       PT LONG TERM GOAL #1   Title Patient will demonstrate/report pain at worst less than or equal to 2/10 to facilitate minimal limitation in daily activity secondary to pain symptoms.    Time 8    Period Weeks    Status On-going      PT LONG TERM GOAL #2   Title Patient will demonstrate independent use of home exercise program to facilitate ability to maintain/progress functional gains from skilled physical therapy services.    Time 8    Period Weeks    Status On-going      PT LONG TERM GOAL #3   Title Patient will demonstrate Lt LE MMT 5/5 throughout to facilitate ability to perform usual standing,  walking, stairs at PLOF s limitation due to symptoms.    Time 8    Period Weeks    Status On-going      PT LONG TERM GOAL #4   Title Pt. will demonstrate FOTO > = 56 to indicated reduced disability due to condition.    Time 8    Period Weeks    Status On-going      PT LONG TERM GOAL #5   Title Pt. will demonstrate Lt ankle AROM equal to Rt s symptoms for usual ambulation mobility at PLOF.    Time 8    Period Weeks    Status On-going      PT LONG TERM GOAL #6   Title Pt. will demonstrate bilateral SLS > 15 seconds to facilitate ability to perform uneven ground ambulation at PLOF.    Time 8    Period Weeks    Status On-going      PT LONG TERM GOAL #7   Title Pt. will demonstrate/report ability to ascend descend stairs reciprocal gait pattern s rail for household entry.    Time 8    Period Weeks    Status On-going                   Plan - 04/27/20 1541     Clinical Impression Statement Continued review of cues for HEP given today, specifically on eccentric PF lowering.  Balance control improved slightly in quality compared to last visit.  Use of ionto today to add to treatment plan.    Personal Factors and Comorbidities Comorbidity 3+    Comorbidities Recent COVID 01/2020, HTN, fibromyalgia    Examination-Activity Limitations Sit;Squat;Stairs;Stand;Locomotion Level;Transfers    Examination-Participation Restrictions Occupation;Community Activity;Yard Work;Meal Prep;Laundry;School;Cleaning    Stability/Clinical Decision Making Stable/Uncomplicated    Rehab Potential Good    PT Frequency --   1-2x/week   PT Duration 8 weeks    PT Treatment/Interventions ADLs/Self Care Home Management;Cryotherapy;Electrical Stimulation;Iontophoresis 35m/ml Dexamethasone;Moist Heat;Balance training;Therapeutic exercise;Therapeutic activities;Functional mobility training;Gait training;Stair training;DME Instruction;Ultrasound;Neuromuscular re-education;Patient/family education;Passive range  of motion;Spinal  Manipulations;Joint Manipulations;Dry needling;Taping;Vasopneumatic Device;Manual techniques    PT Next Visit Plan Ionto,  eccentric inversion, PF strengthening, compliant surface balance    PT Home Exercise Plan [email or web address]    Consulted and Agree with Plan of Care Patient             Patient will benefit from skilled therapeutic intervention in order to improve the following deficits and impairments:  Abnormal gait,Decreased endurance,Hypomobility,Increased edema,Decreased activity tolerance,Decreased strength,Pain,Difficulty walking,Decreased mobility,Decreased balance,Decreased range of motion,Impaired perceived functional ability,Improper body mechanics,Impaired flexibility,Decreased coordination  Visit Diagnosis: Pain in left ankle and joints of left foot  Muscle weakness (generalized)  Difficulty in walking, not elsewhere classified     Problem List Patient Active Problem List   Diagnosis Date Noted   GERD (gastroesophageal reflux disease) 03/27/2019   Depression, recurrent ([place]) 11/25/2018   Onychomycosis 07/06/2013   Obesity (BMI 30-39.9) 07/06/2013   Osteoarthritis, multiple sites 04/21/2012   Hypothyroid 03/07/2011   Biliary dyskinesia 07/04/2010   Essential hypertension 09/28/2009   IRREGULAR MENSES 09/28/2009   Headache 09/28/2009   Scot Jun, PT, DPT, OCS, ATC 04/27/20  4:05 PM  PHYSICAL THERAPY DISCHARGE SUMMARY  Visits from Start of Care: 4     Current functional level related to goals / functional outcomes: See note   Remaining deficits: See note   Education / Equipment: HEP   Patient agrees to discharge. Patient goals were partially met. Patient is being discharged due to not returning since the last visit.  Scot Jun, PT, DPT, OCS, ATC 06/14/20  10:36 AM      [facility] Physical Therapy [address] [place], [place], [address] Phone: [phone]   Fax:  [phone]  Name: Natasha Fitzgerald MRN: [id number] Date of Birth: [birth year]

## 2020-05-01 ENCOUNTER — Encounter (HOSPITAL_COMMUNITY): Payer: Self-pay | Admitting: *Deleted

## 2020-05-01 ENCOUNTER — Other Ambulatory Visit: Payer: Self-pay

## 2020-05-01 ENCOUNTER — Ambulatory Visit (HOSPITAL_COMMUNITY)
Admission: EM | Admit: 2020-05-01 | Discharge: 2020-05-01 | Disposition: A | Discharge status: Home / Self Care | Payer: BC Managed Care – PPO | Attending: Physician Assistant | Admitting: Physician Assistant

## 2020-05-01 DIAGNOSIS — Z23 Encounter for immunization: Secondary | ICD-10-CM | POA: Diagnosis not present

## 2020-05-01 DIAGNOSIS — S81812A Laceration without foreign body, left lower leg, initial encounter: Secondary | ICD-10-CM

## 2020-05-01 MED ORDER — TETANUS-DIPHTH-ACELL PERTUSSIS 5-2.5-18.5 LF-MCG/0.5 IM SUSY
0.5000 mL | PREFILLED_SYRINGE | Freq: Once | INTRAMUSCULAR | Status: AC
  Administered 2020-05-01: 0.5 mL via INTRAMUSCULAR

## 2020-05-01 MED ORDER — TETANUS-DIPHTH-ACELL PERTUSSIS 5-2.5-18.5 LF-MCG/0.5 IM SUSY
PREFILLED_SYRINGE | INTRAMUSCULAR | Status: AC
  Filled 2020-05-01: qty 0.5

## 2020-05-01 NOTE — Discharge Instructions (Addendum)
Keep wound clean and dry Return if you notice increased redness, swelling, or drainage

## 2020-05-01 NOTE — ED Triage Notes (Signed)
Pt reports a trip and lac to Lt anterior Leg.

## 2020-05-02 ENCOUNTER — Encounter: Payer: BC Managed Care – PPO | Admitting: Rehabilitative and Restorative Service Providers"

## 2020-05-06 ENCOUNTER — Ambulatory Visit: Payer: BC Managed Care – PPO | Admitting: Family Medicine

## 2020-05-06 ENCOUNTER — Other Ambulatory Visit: Payer: Self-pay

## 2020-05-06 ENCOUNTER — Ambulatory Visit (INDEPENDENT_AMBULATORY_CARE_PROVIDER_SITE_OTHER): Payer: BC Managed Care – PPO

## 2020-05-06 VITALS — BP 147/87 | HR 79 | Ht 63.0 in | Wt 193.4 lb

## 2020-05-06 DIAGNOSIS — S81812A Laceration without foreign body, left lower leg, initial encounter: Secondary | ICD-10-CM

## 2020-05-06 DIAGNOSIS — M797 Fibromyalgia: Secondary | ICD-10-CM | POA: Insufficient documentation

## 2020-05-06 DIAGNOSIS — M25562 Pain in left knee: Secondary | ICD-10-CM

## 2020-05-06 MED ORDER — TRAMADOL HCL 50 MG PO TABS: 50.0000 mg | ORAL_TABLET | Freq: Three times a day (TID) | ORAL | 0 refills | Status: DC | PRN

## 2020-05-06 NOTE — Progress Notes (Signed)
   I, Natasha Fitzgerald, LAT, ATC, am serving as scribe for Dr. [name].  Ticara Fitzgerald is a 55 y.o. female who presents to [place] at [facility] today for L knee and leg pain after a fall that began on 05/01/20. MOI: Pt was taking a bed frame apart and tripped over the frame. She was last seen by Dr. Georgina Snell on 03/03/20 for f/u of L ankle pain / post tib dysfunction.  Pt was seen at UC afterward and laceration on anterior lower leg was glued back together. Wound is about 3" in length. Since her last visit, pt reports L knee is painful along the anterior-medial aspect. Pain is intermittent. 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  Swelling: yes Aggravating factors: standing, walking Treatments tried: ice, IBU, rest  Diagnostic testing: L foot XR- 07/02/19  Pertinent review of systems: No fevers or chills  Relevant historical information: Hypertension, fibromyalgia   Exam:  BP (!) 147/87 (BP Location: Right Arm, Patient Position: Sitting, Cuff Size: Normal)   Pulse 79   Ht 5\' 3"  (1.6 m)   Wt 193 lb 6.4 oz (87.7 kg)   LMP 12/26/2012   SpO2 98%   BMI 34.26 kg/m  General: Well Developed, well nourished, and in no acute distress.   MSK: Left knee mild effusion normal-appearing otherwise. Normal motion. Tender palpation medial joint line. Stable ligamentous exam. Knee McMurray's test over mild guarding with exam testing. Intact strength to flexion and extension.  Left lower leg laceration anterior lower leg with minimal erythema.  Mildly tender around laceration.  No surrounding induration or severe erythema.    Lab and Radiology Results  X-ray images left knee obtained today personally and independently interpreted No fractures.  Mild degenerative changes. Await formal radiology review    Assessment and Plan: 55 y.o. female with knee pain after a laceration/contusion to the anterior tibia.  Patient is guarding a bit weak in exam testing  less reliable.  However I do not think that she has a significant ligament or meniscus injury today.  Plan for compression with Ace wrap over the knee and Voltaren gel and recheck in 1 week.  The wound at the tibia looks to be healing well.  Watchful waiting and recheck in 1 week.  Limited tramadol  PDMP reviewed during this encounter. Orders Placed This Encounter  Procedures  . DG Knee AP/LAT W/Sunrise Left    Standing Status:   Future    Number of Occurrences:   1    Standing Expiration Date:   05/06/2021    Order Specific Question:   Reason for Exam (SYMPTOM  OR DIAGNOSIS REQUIRED)    Answer:   eval left knee    Order Specific Question:   Is patient pregnant?    Answer:   No    Order Specific Question:   Preferred imaging location?    Answer:   [facility]   Meds ordered this encounter  Medications  . traMADol (ULTRAM) 50 MG tablet    Sig: Take 1 tablet (50 mg total) by mouth every 8 (eight) hours as needed for severe pain.    Dispense:  15 tablet    Refill:  0     Discussed warning signs or symptoms. Please see discharge instructions. Patient expresses understanding.   The above documentation has been reviewed and is accurate and complete [name], M.D.

## 2020-05-06 NOTE — Patient Instructions (Addendum)
Thank you for coming in today.  Please use Voltaren gel (Generic Diclofenac Gel) up to 4x daily for pain as needed.  This is available over-the-counter as both the name brand Voltaren gel and the generic diclofenac gel.  Continue wound care of the leg wound.   Recheck in about 1 week.   Knee compression would help as well.   Please get an Xray today before you leave

## 2020-05-09 NOTE — Progress Notes (Signed)
Left knee x-ray shows no fractures.  Looks normal to radiology

## 2020-05-10 ENCOUNTER — Other Ambulatory Visit: Payer: Self-pay

## 2020-05-10 ENCOUNTER — Ambulatory Visit (INDEPENDENT_AMBULATORY_CARE_PROVIDER_SITE_OTHER): Payer: BC Managed Care – PPO | Admitting: Family Medicine

## 2020-05-10 ENCOUNTER — Telehealth: Payer: Self-pay | Admitting: Family Medicine

## 2020-05-10 ENCOUNTER — Encounter: Payer: Self-pay | Admitting: Family Medicine

## 2020-05-10 VITALS — BP 138/88 | HR 92 | Ht 63.0 in | Wt 194.8 lb

## 2020-05-10 DIAGNOSIS — M25562 Pain in left knee: Secondary | ICD-10-CM | POA: Diagnosis not present

## 2020-05-10 DIAGNOSIS — S81812D Laceration without foreign body, left lower leg, subsequent encounter: Secondary | ICD-10-CM

## 2020-05-10 DIAGNOSIS — S8012XA Contusion of left lower leg, initial encounter: Secondary | ICD-10-CM | POA: Diagnosis not present

## 2020-05-10 NOTE — Patient Instructions (Signed)
Thank you for coming in today.  Recheck in about 2 weeks.   Ok to start PT next week if you can schedule.   Ok to go to [employer]. Stay out of the water.

## 2020-05-10 NOTE — Telephone Encounter (Signed)
Spoke w/ pt and discussed the signs and symptoms she was experiencing. Pt reported that being last night, 05/09/20, her L ankle had become red, splotchy, and swollen. Pt stated that her L ankle was not painful however. Pt has been treating the L ankle with ice and elevation. Pt stated that there is no redness coming from the laceration on her lower leg. Pt was advised that those s/s are concerning for infection and AT advised her to be seen either by the ED or Urgent Care today.

## 2020-05-10 NOTE — Telephone Encounter (Signed)
Patient called stating that beginning last night, her left ankle is red, splotchy, and swollen. She said that it is not painful but does feel uncomfortable.  Please advise.

## 2020-05-10 NOTE — Telephone Encounter (Signed)
Pt is scheduled to see Dr. Georgina Snell today, 05/10/20, at 1:45

## 2020-05-10 NOTE — Progress Notes (Signed)
   I, Peterson Lombard, LAT, ATC acting as a scribe for [name], MD.  Natasha Fitzgerald is a 55 y.o. female who presents to [place] at [facility] today for L ankle redness and swelling. Pt was previously seen by Dr. Georgina Snell for L knee pain and L lower leg laceration. She was prescribed Tramadol and advised to use compression on her L knee.  Pt called the office this morning reporting that in the evening on 05/09/20 her L ankle became red, splotchy, and swollen. Pt notes that she can't tell if the L ankle is warm or if she has a fever. Pt spoke to AT on the phone this morning and was advised to go to the ED or UC, however Dr. Georgina Snell had availability to be seen today, so pt was scheduled for a visit for this afternoon.  She states that her L ankle feels stiff and is experiencing pain and swelling at her L medial ankle and L ant ankle.  She does have some minor bruising at her L medial ankle.  Diagnostic imaging: L knee XR- 05/06/20  Pertinent review of systems: No fevers or chills  Relevant historical information: Hypertension   Exam:  BP 138/88 (BP Location: Right Arm, Patient Position: Sitting, Cuff Size: Normal)   Pulse 92   Ht 5\' 3"  (1.6 m)   Wt 194 lb 12.8 oz (88.4 kg)   LMP 12/26/2012   SpO2 98%   BMI 34.51 kg/m  General: Well Developed, well nourished, and in no acute distress.   MSK: Left anterior shin well-appearing wound covered by Dermabond.  No surrounding erythema.  Tender palpation mildly. Lower leg slight bruising present posterior medial calcaneus.  Mildly tender palpation. No significant calf swelling.  No palpable cords.   Calf is nontender.     Assessment and Plan: 55 y.o. female with left shin laceration.  Doing well.  Patient has no signs of infection today which was the chief concern over the phone when patient called her earlier.  The darker pigment that she sees down her leg is bruising/ecchymosis from the hematoma draining down her  leg.  She is clinically improving but still quite symptomatic.  Plan to continue conservative management for another 2 weeks and recheck in 2 weeks.     Discussed warning signs or symptoms. Please see discharge instructions. Patient expresses understanding.   The above documentation has been reviewed and is accurate and complete [name], M.D.

## 2020-05-11 ENCOUNTER — Ambulatory Visit: Payer: BC Managed Care – PPO | Admitting: Family Medicine

## 2020-05-12 ENCOUNTER — Ambulatory Visit: Payer: BC Managed Care – PPO | Admitting: Family Medicine

## 2020-05-26 ENCOUNTER — Ambulatory Visit: Payer: BC Managed Care – PPO | Admitting: Family Medicine

## 2020-05-26 ENCOUNTER — Encounter: Payer: Self-pay | Admitting: Family Medicine

## 2020-05-26 ENCOUNTER — Other Ambulatory Visit: Payer: Self-pay

## 2020-05-26 VITALS — BP 104/72 | HR 83 | Ht 63.0 in | Wt 194.0 lb

## 2020-05-26 DIAGNOSIS — S8002XD Contusion of left knee, subsequent encounter: Secondary | ICD-10-CM

## 2020-05-26 DIAGNOSIS — M25562 Pain in left knee: Secondary | ICD-10-CM | POA: Diagnosis not present

## 2020-05-26 DIAGNOSIS — S8012XD Contusion of left lower leg, subsequent encounter: Secondary | ICD-10-CM | POA: Diagnosis not present

## 2020-05-26 DIAGNOSIS — S81812D Laceration without foreign body, left lower leg, subsequent encounter: Secondary | ICD-10-CM | POA: Diagnosis not present

## 2020-05-26 NOTE — Progress Notes (Signed)
   I, Wendy Poet, LAT, ATC, am serving as scribe for Dr. [name].  Natasha Fitzgerald is a 55 y.o. female who presents to [place] at [facility] today for f/u L knee and leg pain w/ lower leg laceration after a fall that occurred on 05/01/20. MOI: Pt was taking a bed frame apart and tripped over the frame. Pt was last seen by Dr. Georgina Snell on 05/06/20 and was advised to use a compressive ace wrap and Voltaren gel. Pt was seen again on 05/10/20 for suspected redness and foot swelling, which was bruising as opposed to signs of infection. 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  She states that she is now able to use a regular bandaid for the wound on her L lower leg.  She states that she removed the glue from her wound and feels this has helped the state of her wound.  She states that the wound area is feeling much better.   Dx imaging: 05/06/20 L knee XR  Pertinent review of systems: No fevers or chills  Relevant historical information: Hypertension.  Fibromyalgia   Exam:  BP 104/72 (BP Location: Right Arm, Patient Position: Sitting, Cuff Size: Normal)   Pulse 83   Ht 5\' 3"  (1.6 m)   Wt 194 lb (88 kg)   LMP 12/26/2012   SpO2 97%   BMI 34.37 kg/m  General: Well Developed, well nourished, and in no acute distress.   MSK: Left knee normal.  Normal motion. Left shin bandaged and nontender Left ankle's mild bruising present medial ankle nontender normal ankle motion and strength.    Assessment and Plan: 55 y.o. female with left leg injury after fall.  Significantly improving.  Wound is healing and knee and ankle are improving.  Continue home exercise program and advance activity as tolerated.  The wound will take a few more weeks to heal but should heal up well overall.  Watchful waiting and recheck back with me as needed.    Discussed warning signs or symptoms.  Please see discharge instructions. Patient expresses understanding.   The above documentation has been reviewed and is accurate and complete [name], M.D.  Total encounter time 20 minutes including face-to-face time with the patient and, reviewing past medical record, and charting on the date of service.   Discussed treatment plan and options.

## 2020-05-26 NOTE — Patient Instructions (Signed)
Thank you for coming in today.  Leg wounds will take a long time to fully heal.   Continue to advance activity as tolerated.   Recheck as needed.

## 2020-06-08 ENCOUNTER — Ambulatory Visit: Payer: BC Managed Care – PPO | Admitting: Family Medicine

## 2020-06-08 ENCOUNTER — Ambulatory Visit: Payer: Self-pay

## 2020-06-08 ENCOUNTER — Other Ambulatory Visit: Payer: Self-pay

## 2020-06-08 VITALS — Ht 63.0 in | Wt 195.2 lb

## 2020-06-08 DIAGNOSIS — M25571 Pain in right ankle and joints of right foot: Secondary | ICD-10-CM

## 2020-06-08 DIAGNOSIS — M7750 Other enthesopathy of unspecified foot: Secondary | ICD-10-CM | POA: Diagnosis not present

## 2020-06-08 MED ORDER — PREDNISONE 50 MG PO TABS: 50.0000 mg | ORAL_TABLET | Freq: Every day | ORAL | 0 refills | Status: DC

## 2020-06-08 NOTE — Progress Notes (Signed)
   I, Peterson Lombard, LAT, ATC acting as a scribe for [name], MD.  Natasha Fitzgerald is a 55 y.o. female who presents to [place] at [facility] today for R foot pain ongoing since 05/27/20. MOI: Pt had an incident 30 years ago when a horse stepped on her R foot. Pt was previously seen by Dr. Georgina Snell on 05/26/20 for L knee and lower leg pain that she injured in a fall. Today, pt locates pain to the dorsum of her R foot and notes a hard nodule. Pt reports the mass will disappear and come back randomly.  It is feeling a lot better today than it was earlier this week.  She notes a horse stepped on her foot years ago which may have started this cycle of foot swelling.  Aggravates: any foot/ankle motions Treatments tried: RICE, naproxen  Dx imaging: 11/25/18 R foot XR  Pertinent review of systems: No fevers or chills  Relevant historical information: Hypertension, depression, fibromyalgia   Exam:  Ht 5\' 3"  (1.6 m)   Wt 195 lb 3.2 oz (88.5 kg)   LMP 12/26/2012   BMI 34.58 kg/m  General: Well Developed, well nourished, and in no acute distress.   MSK: Right foot normal.  Mildly tender palpation dorsal lateral midfoot.  Some pain with resisted toe extension.    Lab and Radiology Results  Diagnostic Limited MSK Ultrasound of: Right dorsal foot midfoot Things to digitorum longus tendons visualized.  Tendons are intact however hypoechoic fluid tracks and tendon sheath consistent with tenosynovitis.  No significant fluid tracks distal or proximal beyond the midfoot area of previous swelling. Bony structures otherwise normal-appearing Impression: Tenosynovitis midfoot     Assessment and Plan: 55 y.o. female with patient likely had worse tenosynovitis to explain the dorsal midfoot swelling that she experienced previously.  Symptoms are reasonably under control now.  Plan for Taryn gel Home exercise program and compression.  Backup course of prednisone for use if this  is severely worsened in the future.  Certainly could do an injection to this area if needed however would like to stay away from injecting around weightbearing tendons if possible.  Recheck as needed.   PDMP not reviewed this encounter. Orders Placed This Encounter  Procedures  . [place] LIMITED JOINT SPACE STRUCTURES LOW RIGHT(NO LINKED CHARGES)    Order Specific Question:   Reason for Exam (SYMPTOM  OR DIAGNOSIS REQUIRED)    Answer:   eval foot    Order Specific Question:   Preferred imaging location?    Answer:   [place]   Meds ordered this encounter  Medications  . predniSONE (DELTASONE) 50 MG tablet    Sig: Take 1 tablet (50 mg total) by mouth daily. Take if the foot pain worsens    Dispense:  5 tablet    Refill:  0     Discussed warning signs or symptoms. Please see discharge instructions. Patient expresses understanding.   The above documentation has been reviewed and is accurate and complete [name], M.D.

## 2020-06-08 NOTE — Patient Instructions (Addendum)
Thank you for coming in today.  Please use Voltaren gel (Generic Diclofenac Gel) up to 4x daily for pain as needed.  This is available over-the-counter as both the name brand Voltaren gel and the generic diclofenac gel.  I think you have tendonitis in the foot.   Take the prednisone if you need to.   I recommend you obtained a compression sleeve to help with your joint problems. There are many options on the market however I recommend obtaining a full ankle Body Helix compression sleeve.  You can find information (including how to appropriate measure yourself for sizing) can be found at www.Body http://www.lambert.com/.  Many of these products are health savings account (HSA) eligible.   You can use the compression sleeve at any time throughout the day but is most important to use while being active as well as for 2 hours post-activity.   It is appropriate to ice following activity with the compression sleeve in place.  I could do an injection if needed.

## 2020-06-11 ENCOUNTER — Other Ambulatory Visit: Payer: Self-pay | Admitting: Family Medicine

## 2020-06-18 ENCOUNTER — Other Ambulatory Visit: Payer: Self-pay | Admitting: Family Medicine

## 2020-07-18 ENCOUNTER — Other Ambulatory Visit: Payer: Self-pay | Admitting: Family Medicine

## 2020-07-18 DIAGNOSIS — Z1231 Encounter for screening mammogram for malignant neoplasm of breast: Secondary | ICD-10-CM

## 2020-07-19 ENCOUNTER — Ambulatory Visit
Admission: RE | Admit: 2020-07-19 | Discharge: 2020-07-19 | Disposition: A | Discharge status: Home / Self Care | Payer: BC Managed Care – PPO | Source: Ambulatory Visit | Attending: Family Medicine | Admitting: Family Medicine

## 2020-07-19 ENCOUNTER — Other Ambulatory Visit: Payer: Self-pay

## 2020-07-19 DIAGNOSIS — Z1231 Encounter for screening mammogram for malignant neoplasm of breast: Secondary | ICD-10-CM

## 2020-07-21 ENCOUNTER — Other Ambulatory Visit: Payer: Self-pay | Admitting: Family Medicine

## 2020-07-21 DIAGNOSIS — R928 Other abnormal and inconclusive findings on diagnostic imaging of breast: Secondary | ICD-10-CM

## 2020-07-26 ENCOUNTER — Encounter: Payer: BC Managed Care – PPO | Admitting: Family Medicine

## 2020-07-27 ENCOUNTER — Ambulatory Visit (INDEPENDENT_AMBULATORY_CARE_PROVIDER_SITE_OTHER): Payer: BC Managed Care – PPO | Admitting: Family Medicine

## 2020-07-27 ENCOUNTER — Other Ambulatory Visit: Payer: Self-pay

## 2020-07-27 ENCOUNTER — Encounter: Payer: Self-pay | Admitting: Family Medicine

## 2020-07-27 VITALS — BP 132/90 | HR 83 | Temp 98.5°F | Ht 63.0 in | Wt 194.7 lb

## 2020-07-27 DIAGNOSIS — Z0000 Encounter for general adult medical examination without abnormal findings: Secondary | ICD-10-CM | POA: Diagnosis not present

## 2020-07-27 LAB — CBC WITH DIFFERENTIAL/PLATELET
Basophils Absolute: 0.1 10*3/uL (ref 0.0–0.1)
Basophils Relative: 1 % (ref 0.0–3.0)
Eosinophils Absolute: 0.2 10*3/uL (ref 0.0–0.7)
Eosinophils Relative: 3.6 % (ref 0.0–5.0)
HCT: 43.8 % (ref 36.0–46.0)
Hemoglobin: 14.7 g/dL (ref 12.0–15.0)
Lymphocytes Relative: 23.4 % (ref 12.0–46.0)
Lymphs Abs: 1.3 10*3/uL (ref 0.7–4.0)
MCHC: 33.5 g/dL (ref 30.0–36.0)
MCV: 86.9 fl (ref 78.0–100.0)
Monocytes Absolute: 0.4 10*3/uL (ref 0.1–1.0)
Monocytes Relative: 7 % (ref 3.0–12.0)
Neutro Abs: 3.7 10*3/uL (ref 1.4–7.7)
Neutrophils Relative %: 65 % (ref 43.0–77.0)
Platelets: 252 10*3/uL (ref 150.0–400.0)
RBC: 5.04 Mil/uL (ref 3.87–5.11)
RDW: 13.7 % (ref 11.5–15.5)
WBC: 5.7 10*3/uL (ref 4.0–10.5)

## 2020-07-27 LAB — LIPID PANEL
Cholesterol: 211 mg/dL — ABNORMAL HIGH (ref 0–200)
HDL: 49 mg/dL (ref >39.00)
LDL Cholesterol: 139 mg/dL — ABNORMAL HIGH (ref 0–99)
NonHDL: 161.66
Total CHOL/HDL Ratio: 4
Triglycerides: 111 mg/dL (ref 0.0–149.0)
VLDL: 22.2 mg/dL (ref 0.0–40.0)

## 2020-07-27 LAB — HEPATIC FUNCTION PANEL
ALT: 32 U/L (ref 0–35)
AST: 21 U/L (ref 0–37)
Albumin: 4.6 g/dL (ref 3.5–5.2)
Alkaline Phosphatase: 104 U/L (ref 39–117)
Bilirubin, Direct: 0.1 mg/dL (ref 0.0–0.3)
Total Bilirubin: 0.6 mg/dL (ref 0.2–1.2)
Total Protein: 7.3 g/dL (ref 6.0–8.3)

## 2020-07-27 LAB — BASIC METABOLIC PANEL
BUN: 11 mg/dL (ref 6–23)
CO2: 28 mEq/L (ref 19–32)
Calcium: 9.9 mg/dL (ref 8.4–10.5)
Chloride: 100 mEq/L (ref 96–112)
Creatinine, Ser: 0.71 mg/dL (ref 0.40–1.20)
GFR: 95.53 mL/min (ref >60.00)
Glucose, Bld: 85 mg/dL (ref 70–99)
Potassium: 4.4 mEq/L (ref 3.5–5.1)
Sodium: 138 mEq/L (ref 135–145)

## 2020-07-27 LAB — TSH: TSH: 4.41 u[IU]/mL (ref 0.35–5.50)

## 2020-07-27 NOTE — Progress Notes (Signed)
Established Patient Office Visit  Subjective:  Patient ID: Natasha Fitzgerald, female    DOB: 11-14-1965  Age: 55 y.o. MRN: [id number]  CC:  Chief Complaint  Patient presents with   Annual Exam    HPI Natasha Fitzgerald presents for physical exam.  She had some postmenopausal bleeding last year and was referred to GYN.  Fortunately, she reports her biopsies were negative for cancer.  She had Pap smear last fall.  She had some ongoing issues with tendinitis in her ankle.  Has slowly been able to regain some activities and recently joined a gym.  Health maintenance reviewed  -No history of hepatitis C screening but low risk -No history of shingles vaccine -Recent mammogram as above -Pap smear last fall -Tetanus due 2032 -Colonoscopy due 2028  Family history-does not stay in touch with her biologic father.  Mother had history of hypertension.  She had a paternal grandmother with breast cancer history.  Social history-she teaches art in the schools.  Non-smoker.  No regular alcohol use.  She is married and has a couple of children.   Past Medical History:  Diagnosis Date   Anxiety    Chronic headaches 09/28/2009   DEPRESSION 09/28/2009   Fibromyalgia    History of kidney stones    HYPERTENSION 09/28/2009   IRREGULAR MENSES 09/28/2009   RUQ pain    Thyroid disease     Past Surgical History:  Procedure Laterality Date   CESAREAN SECTION  1997   TONSILLECTOMY  1973    Family History  Problem Relation Age of Onset   Hypertension Mother    Hashimoto's thyroiditis Sister    Breast cancer Maternal Grandmother    Heart disease Maternal Grandfather    Cancer Paternal Grandmother        breast cancer   Heart disease Paternal Grandfather    Colon cancer Neg Hx    Colon polyps Neg Hx    Esophageal cancer Neg Hx    Rectal cancer Neg Hx    Stomach cancer Neg Hx     Social History   Socioeconomic History   Marital status: Married    Spouse name: Not on file    Number of children: Not on file   Years of education: Not on file   Highest education level: Not on file  Occupational History   Not on file  Tobacco Use   Smoking status: Never   Smokeless tobacco: Never  Vaping Use   Vaping Use: Never used  Substance and Sexual Activity   Alcohol use: Yes    Comment: occ   Drug use: No   Sexual activity: Not on file  Other Topics Concern   Not on file  Social History Narrative   Not on file   Social Determinants of Health   Financial Resource Strain: Not on file  Food Insecurity: Not on file  Transportation Needs: Not on file  Physical Activity: Not on file  Stress: Not on file  Social Connections: Not on file  Intimate Partner Violence: Not on file    Outpatient Medications Prior to Visit  Medication Sig Dispense Refill   acetaminophen (TYLENOL) 500 MG tablet Take 500 mg by mouth every 6 (six) hours as needed.     diclofenac Sodium (VOLTAREN) 1 % GEL Apply 2 g topically 4 (four) times daily. 100 g 1   escitalopram (LEXAPRO) 10 MG tablet TAKE 1 TABLET BY MOUTH EVERY DAY 90 tablet 1   levothyroxine (SYNTHROID) 88 MCG  tablet TAKE 1 TABLET (88 MCG TOTAL) BY MOUTH DAILY BEFORE BREAKFAST. 90 tablet 1   losartan (COZAAR) 25 MG tablet TAKE 2 TABLETS BY MOUTH DAILY 180 tablet 0   predniSONE (DELTASONE) 50 MG tablet Take 1 tablet (50 mg total) by mouth daily. Take if the foot pain worsens (Patient not taking: Reported on 07/27/2020) 5 tablet 0   traMADol (ULTRAM) 50 MG tablet Take 1 tablet (50 mg total) by mouth every 8 (eight) hours as needed for severe pain. 15 tablet 0   No facility-administered medications prior to visit.    Allergies  Allergen Reactions   Doxycycline     "headache, irritability, upset stomach"   Penicillins     REACTION: hives    ROS Review of Systems  Constitutional:  Negative for activity change, appetite change, fatigue, fever and unexpected weight change.  HENT:  Negative for ear pain, hearing loss, sore throat  and trouble swallowing.   Eyes:  Negative for visual disturbance.  Respiratory:  Negative for cough and shortness of breath.   Cardiovascular:  Negative for chest pain and palpitations.  Gastrointestinal:  Negative for abdominal pain, blood in stool, constipation and diarrhea.  Genitourinary:  Negative for dysuria and hematuria.  Musculoskeletal:  Negative for arthralgias, back pain and myalgias.  Skin:  Negative for rash.  Neurological:  Negative for dizziness, syncope and headaches.  Hematological:  Negative for adenopathy.  Psychiatric/Behavioral:  Negative for confusion and dysphoric mood.      Objective:    Physical Exam Constitutional:      Appearance: She is well-developed.  HENT:     Head: Normocephalic and atraumatic.  Eyes:     Pupils: Pupils are equal, round, and reactive to light.  Neck:     Thyroid: No thyromegaly.  Cardiovascular:     Rate and Rhythm: Normal rate and regular rhythm.     Heart sounds: Normal heart sounds. No murmur heard. Pulmonary:     Effort: No respiratory distress.     Breath sounds: Normal breath sounds. No wheezing or rales.  Abdominal:     General: Bowel sounds are normal. There is no distension.     Palpations: Abdomen is soft. There is no mass.     Tenderness: There is no abdominal tenderness. There is no guarding or rebound.  Musculoskeletal:        General: Normal range of motion.     Cervical back: Normal range of motion and neck supple.  Lymphadenopathy:     Cervical: No cervical adenopathy.  Skin:    Findings: No rash.  Neurological:     Mental Status: She is alert and oriented to person, place, and time.     Cranial Nerves: No cranial nerve deficit.     Deep Tendon Reflexes: Reflexes normal.  Psychiatric:        Behavior: Behavior normal.        Thought Content: Thought content normal.        Judgment: Judgment normal.    BP 132/90 (BP Location: Left Arm, Cuff Size: Normal)   Pulse 83   Temp 98.5 F (36.9 C) (Oral)    Ht '5\' 3"'$  (1.6 m)   Wt 194 lb 11.2 oz (88.3 kg)   LMP 12/26/2012   SpO2 98%   BMI 34.49 kg/m  Wt Readings from Last 3 Encounters:  07/27/20 194 lb 11.2 oz (88.3 kg)  06/08/20 195 lb 3.2 oz (88.5 kg)  05/26/20 194 lb (88 kg)     Health  Maintenance Due  Topic Date Due   HIV Screening  Never done   Hepatitis C Screening  Never done   Zoster Vaccines- Shingrix (1 of 2) Never done    There are no preventive care reminders to display for this patient.  Lab Results  Component Value Date   TSH 4.36 09/24/2018   Lab Results  Component Value Date   WBC 8.9 07/01/2013   HGB 14.7 07/01/2013   HCT 43.7 07/01/2013   MCV 86.2 07/01/2013   PLT 238.0 07/01/2013   Lab Results  Component Value Date   NA 139 03/27/2019   K 4.1 03/27/2019   CO2 28 03/27/2019   GLUCOSE 80 03/27/2019   BUN 17 03/27/2019   CREATININE 0.76 03/27/2019   BILITOT 0.7 07/01/2013   ALKPHOS 93 07/01/2013   AST 18 07/01/2013   ALT 20 07/01/2013   PROT 7.3 07/01/2013   ALBUMIN 4.1 07/01/2013   CALCIUM 9.6 03/27/2019   GFR 89.57 03/18/2015   Lab Results  Component Value Date   CHOL 259 (A) 01/28/2015   Lab Results  Component Value Date   HDL 47 01/28/2015   Lab Results  Component Value Date   LDLCALC 187 01/28/2015   Lab Results  Component Value Date   TRIG 126 01/28/2015   Lab Results  Component Value Date   CHOLHDL 5 07/01/2013   No results found for: HGBA1C    Assessment & Plan:   Problem List Items Addressed This Visit   None Visit Diagnoses     Physical exam    -  Primary   Relevant Orders   Basic metabolic panel   Lipid panel   CBC with Differential/Platelet   TSH   Hepatic function panel   Hep C Antibody     Blood pressure mildly elevated today.  She would like to try getting back in the gym and losing some weight as a first step rather than adding more medication.  -We discussed shingles vaccine and she will check on insurance coverage -Check labs as above including  hepatitis C antibody -Follow-up with additional breast studies as recommended per radiology -Continue annual flu vaccine -Pap smear up-to-date  No orders of the defined types were placed in this encounter.   Follow-up: No follow-ups on file.    Carolann Littler, MD

## 2020-07-27 NOTE — Patient Instructions (Signed)
Consider Shingrix vaccine and check on insurance coverage if interested  Monitor blood pressure and be in touch if consistent home readings greater than 140/90  Follow-up with repeat breast studies as ordered

## 2020-07-27 NOTE — Progress Notes (Signed)
Pre visit review using our clinic review tool, if applicable. No additional management support is needed unless otherwise documented below in the visit note. 

## 2020-07-28 LAB — HEPATITIS C ANTIBODY
Hepatitis C Ab: NONREACTIVE
SIGNAL TO CUT-OFF: 0.01 (ref <1.00)

## 2020-08-08 ENCOUNTER — Other Ambulatory Visit: Payer: Self-pay

## 2020-08-08 ENCOUNTER — Ambulatory Visit
Admission: RE | Admit: 2020-08-08 | Discharge: 2020-08-08 | Disposition: A | Discharge status: Home / Self Care | Payer: BC Managed Care – PPO | Source: Ambulatory Visit | Attending: Family Medicine | Admitting: Family Medicine

## 2020-08-08 DIAGNOSIS — R928 Other abnormal and inconclusive findings on diagnostic imaging of breast: Secondary | ICD-10-CM

## 2020-09-05 ENCOUNTER — Other Ambulatory Visit: Payer: Self-pay | Admitting: Family Medicine

## 2020-09-09 ENCOUNTER — Other Ambulatory Visit: Payer: Self-pay | Admitting: Family Medicine

## 2020-10-05 ENCOUNTER — Ambulatory Visit: Payer: BC Managed Care – PPO | Admitting: Family Medicine

## 2020-10-11 ENCOUNTER — Ambulatory Visit: Payer: BC Managed Care – PPO | Admitting: Family Medicine

## 2020-10-16 ENCOUNTER — Other Ambulatory Visit: Payer: Self-pay

## 2020-10-16 ENCOUNTER — Encounter (HOSPITAL_COMMUNITY): Payer: Self-pay

## 2020-10-16 ENCOUNTER — Ambulatory Visit (HOSPITAL_COMMUNITY)
Admission: EM | Admit: 2020-10-16 | Discharge: 2020-10-16 | Disposition: A | Discharge status: Home / Self Care | Payer: BC Managed Care – PPO | Attending: Emergency Medicine | Admitting: Emergency Medicine

## 2020-10-16 DIAGNOSIS — J208 Acute bronchitis due to other specified organisms: Secondary | ICD-10-CM

## 2020-10-16 MED ORDER — ALBUTEROL SULFATE HFA 108 (90 BASE) MCG/ACT IN AERS: 1.0000 | INHALATION_SPRAY | Freq: Four times a day (QID) | RESPIRATORY_TRACT | 0 refills | Status: DC | PRN

## 2020-10-16 MED ORDER — GUAIFENESIN-CODEINE 100-10 MG/5ML PO SOLN: 5.0000 mL | Freq: Four times a day (QID) | ORAL | 0 refills | Status: DC | PRN

## 2020-10-16 MED ORDER — AZITHROMYCIN 250 MG PO TABS: 250.0000 mg | ORAL_TABLET | Freq: Every day | ORAL | 0 refills | Status: DC

## 2020-10-16 NOTE — ED Triage Notes (Signed)
Pt presents with non productive cough, congestion, headache, sinus congestion, and generalized body aches X 6 days that is unrelieved with OTC medication.

## 2020-10-16 NOTE — ED Provider Notes (Addendum)
[place]   [id number] 10/16/20 Arrival Time: 2297   Chief Complaint  Patient presents with   URI     SUBJECTIVE: History from: patient.  Natasha Fitzgerald is a 55 y.o. female presents to the urgent care for complaint of nonproductive cough, nasal congestion, headache, sinus congestion and body aches for the past 6 days.  Denies sick exposure to COVID, flu or strep.  Denies recent travel.  Has tried OTC medication  without relief.  Denies alleviating or aggravating factors.  Denies previous symptoms in the past.   Denies fever, chills, fatigue, sinus pain, rhinorrhea, sore throat, SOB, wheezing, chest pain, nausea, changes in bowel or bladder habits.     ROS: As per HPI.  All other pertinent ROS negative.     Past Medical History:  Diagnosis Date   Anxiety    Chronic headaches 09/28/2009   DEPRESSION 09/28/2009   Fibromyalgia    History of kidney stones    HYPERTENSION 09/28/2009   IRREGULAR MENSES 09/28/2009   RUQ pain    Thyroid disease    Past Surgical History:  Procedure Laterality Date   CESAREAN SECTION  1997   TONSILLECTOMY  1973   Allergies  Allergen Reactions   Doxycycline     "headache, irritability, upset stomach"   Penicillins     REACTION: hives   No current facility-administered medications on file prior to encounter.   Current Outpatient Medications on File Prior to Encounter  Medication Sig Dispense Refill   acetaminophen (TYLENOL) 500 MG tablet Take 500 mg by mouth every 6 (six) hours as needed.     diclofenac Sodium (VOLTAREN) 1 % GEL Apply 2 g topically 4 (four) times daily. 100 g 1   escitalopram (LEXAPRO) 10 MG tablet TAKE 1 TABLET BY MOUTH EVERY DAY 90 tablet 1   levothyroxine (SYNTHROID) 88 MCG tablet TAKE 1 TABLET (88 MCG TOTAL) BY MOUTH DAILY BEFORE BREAKFAST. 90 tablet 1   losartan (COZAAR) 25 MG tablet TAKE 2 TABLETS BY MOUTH DAILY 180 tablet 0   Social History   Socioeconomic History   Marital status: Married    Spouse  name: Not on file   Number of children: Not on file   Years of education: Not on file   Highest education level: Not on file  Occupational History   Not on file  Tobacco Use   Smoking status: Never   Smokeless tobacco: Never  Vaping Use   Vaping Use: Never used  Substance and Sexual Activity   Alcohol use: Yes    Comment: occ   Drug use: No   Sexual activity: Not on file  Other Topics Concern   Not on file  Social History Narrative   Not on file   Social Determinants of Health   Financial Resource Strain: Not on file  Food Insecurity: Not on file  Transportation Needs: Not on file  Physical Activity: Not on file  Stress: Not on file  Social Connections: Not on file  Intimate Partner Violence: Not on file   Family History  Problem Relation Age of Onset   Hypertension Mother    Hashimoto's thyroiditis Sister    Breast cancer Maternal Grandmother    Heart disease Maternal Grandfather    Cancer Paternal Grandmother        breast cancer   Heart disease Paternal Grandfather    Colon cancer Neg Hx    Colon polyps Neg Hx    Esophageal cancer Neg Hx    Rectal  cancer Neg Hx    Stomach cancer Neg Hx     OBJECTIVE:  Vitals:   10/16/20 1600  BP: 124/83  Pulse: 91  Resp: 18  Temp: 98.7 F (37.1 C)  TempSrc: Oral  SpO2: 96%    Physical Exam Vitals and nursing note reviewed.  Constitutional:      General: She is not in acute distress.    Appearance: Normal appearance. She is normal weight. She is not ill-appearing, toxic-appearing or diaphoretic.  HENT:     Head: Normocephalic.     Right Ear: A middle ear effusion is present.     Left Ear: A middle ear effusion is present.     Nose: Congestion present.     Mouth/Throat:     Pharynx: No oropharyngeal exudate or posterior oropharyngeal erythema.  Cardiovascular:     Rate and Rhythm: Normal rate and regular rhythm.     Pulses: Normal pulses.     Heart sounds: Normal heart sounds. No murmur heard.   No friction  rub. No gallop.  Pulmonary:     Effort: Pulmonary effort is normal. No respiratory distress.     Breath sounds: Normal breath sounds. No stridor. No wheezing, rhonchi or rales.  Chest:     Chest wall: No tenderness.  Neurological:     Mental Status: She is alert and oriented to person, place, and time.     LABS:  No results found for this or any previous visit (from the past 24 hour(s)).   ASSESSMENT & PLAN:  1. Acute bronchitis due to other specified organisms     Meds ordered this encounter  Medications   azithromycin (ZITHROMAX) 250 MG tablet    Sig: Take 1 tablet (250 mg total) by mouth daily. Take first 2 tablets together, then 1 every day until finished.    Dispense:  6 tablet    Refill:  0   albuterol (VENTOLIN HFA) 108 (90 Base) MCG/ACT inhaler    Sig: Inhale 1-2 puffs into the lungs every 6 (six) hours as needed for wheezing or shortness of breath.    Dispense:  18 g    Refill:  0   guaiFENesin-codeine 100-10 MG/5ML syrup    Sig: Take 5 mLs by mouth every 6 (six) hours as needed for cough.    Dispense:  120 mL    Refill:  0   Discharge instructions  Get plenty of rest and push fluids  Codeine/Guaifenesin was prescribed for cough Azithromycin was prescribed ProAir was prescribed Use medications daily for symptom relief Use OTC medications like ibuprofen or tylenol as needed fever or pain Call or go to the ED if you have any new or worsening symptoms such as fever, worsening cough, shortness of breath, chest tightness, chest pain, turning blue, changes in mental status, etc...   Reviewed expectations re: course of current medical issues. Questions answered. Outlined signs and symptoms indicating need for more acute intervention. Patient verbalized understanding. After Visit Summary given.          Emerson Monte, FNP 10/16/20 1637    Emerson Monte, FNP 10/16/20 1638    Emerson Monte, FNP 10/16/20 1639

## 2020-10-16 NOTE — Discharge Instructions (Addendum)
Get plenty of rest and push fluids Codeine/Guaifenesin was prescribed Azithromycin was prescribed ProAir was prescribed Use medications daily for symptom relief Use OTC medications like ibuprofen or tylenol as needed fever or pain Call or go to the ED if you have any new or worsening symptoms such as fever, worsening cough, shortness of breath, chest tightness, chest pain, turning blue, changes in mental status, etc..[name]

## 2020-10-26 ENCOUNTER — Ambulatory Visit (HOSPITAL_COMMUNITY)
Admission: EM | Admit: 2020-10-26 | Discharge: 2020-10-26 | Disposition: A | Discharge status: Home / Self Care | Payer: BC Managed Care – PPO | Attending: Physician Assistant | Admitting: Physician Assistant

## 2020-10-26 ENCOUNTER — Other Ambulatory Visit: Payer: Self-pay

## 2020-10-26 ENCOUNTER — Encounter (HOSPITAL_COMMUNITY): Payer: Self-pay

## 2020-10-26 DIAGNOSIS — J329 Chronic sinusitis, unspecified: Secondary | ICD-10-CM | POA: Diagnosis not present

## 2020-10-26 DIAGNOSIS — R051 Acute cough: Secondary | ICD-10-CM

## 2020-10-26 DIAGNOSIS — J40 Bronchitis, not specified as acute or chronic: Secondary | ICD-10-CM

## 2020-10-26 MED ORDER — LEVOFLOXACIN 500 MG PO TABS: 500.0000 mg | ORAL_TABLET | Freq: Every day | ORAL | 0 refills | Status: DC

## 2020-10-26 NOTE — ED Provider Notes (Signed)
[place]    CSN: [id number] Arrival date & time: 10/26/20  1910      History   Chief Complaint Chief Complaint  Patient presents with   Cough    HPI Natasha Fitzgerald is a 55 y.o. female.   Patient presents today with a several week history of ongoing cough.  Reports associated chest tightness and nasal congestion.  Denies nausea, vomiting, fever.  She was seen by our clinic on 10/16/2020 at which point she was treated with cough medication as well as azithromycin.  She was given ProAir inhaler which she has been using as needed without improvement of symptoms.  Denies history of chronic lung condition such as asthma or COPD.  She denies additional antibiotic use.  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  She is requesting paperwork be completed for leave of absence if appropriate.  Reports that despite recent antibiotic use cough has worsened she is having difficulty sleeping through the night.  Reports it is becoming productive with thick sputum.   Past Medical History:  Diagnosis Date   Anxiety    Chronic headaches 09/28/2009   DEPRESSION 09/28/2009   Fibromyalgia    History of kidney stones    HYPERTENSION 09/28/2009   IRREGULAR MENSES 09/28/2009   RUQ pain    Thyroid disease     Patient Active Problem List   Diagnosis Date Noted   Fibromyalgia 05/06/2020   GERD (gastroesophageal reflux disease) 03/27/2019   Depression, recurrent ([place]) 11/25/2018   Onychomycosis 07/06/2013   Obesity (BMI 30-39.9) 07/06/2013   Osteoarthritis, multiple sites 04/21/2012   Hypothyroid 03/07/2011   Biliary dyskinesia 07/04/2010   Essential hypertension 09/28/2009   IRREGULAR MENSES 09/28/2009   Headache 09/28/2009    Past Surgical History:  Procedure Laterality Date   [place]    OB History   No obstetric history on file.      Home Medications    Prior  to Admission medications   Medication Sig Start Date End Date Taking? Authorizing Provider  levofloxacin (LEVAQUIN) 500 MG tablet Take 1 tablet (500 mg total) by mouth daily. 10/26/20  Yes Abdikadir Fohl, Derry Skill, PA-C  acetaminophen (TYLENOL) 500 MG tablet Take 500 mg by mouth every 6 (six) hours as needed.    [provider]  albuterol (VENTOLIN HFA) 108 (90 Base) MCG/ACT inhaler Inhale 1-2 puffs into the lungs every 6 (six) hours as needed for wheezing or shortness of breath. 10/16/20   Avegno, Darrelyn Hillock, FNP  diclofenac Sodium (VOLTAREN) 1 % GEL Apply 2 g topically 4 (four) times daily. 04/29/19   Burchette, Alinda Sierras, MD  escitalopram (LEXAPRO) 10 MG tablet TAKE 1 TABLET BY MOUTH EVERY DAY 09/07/20   Burchette, Alinda Sierras, MD  levothyroxine (SYNTHROID) 88 MCG tablet TAKE 1 TABLET (88 MCG TOTAL) BY MOUTH DAILY BEFORE BREAKFAST. 06/20/20   Burchette, Alinda Sierras, MD  losartan (COZAAR) 25 MG tablet TAKE 2 TABLETS BY MOUTH DAILY 09/09/20   Burchette, Alinda Sierras, MD    Family History Family History  Problem Relation Age of Onset   Hypertension Mother    Hashimoto's thyroiditis Sister    Breast cancer Maternal Grandmother    Heart disease Maternal Grandfather    Cancer Paternal Grandmother        breast cancer   Heart disease Paternal Grandfather    Colon cancer Neg Hx  Colon polyps Neg Hx    Esophageal cancer Neg Hx    Rectal cancer Neg Hx    Stomach cancer Neg Hx     Social History Social History   Tobacco Use   Smoking status: Never   Smokeless tobacco: Never  Vaping Use   Vaping Use: Never used  Substance Use Topics   Alcohol use: Yes    Comment: occ   Drug use: No     Allergies   Doxycycline and Penicillins   Review of Systems Review of Systems  Constitutional:  Positive for activity change. Negative for appetite change, fatigue and fever.  HENT:  Positive for congestion. Negative for sinus pressure, sneezing and sore throat.   Respiratory:  Positive for cough. Negative for  shortness of breath.   Cardiovascular:  Negative for chest pain.  Gastrointestinal:  Negative for abdominal pain, diarrhea, nausea and vomiting.  Neurological:  Negative for dizziness, light-headedness and headaches.    Physical Exam Triage Vital Signs ED Triage Vitals [10/26/20 2019]  Enc Vitals Group     BP (!) 151/85     Pulse Rate 89     Resp 18     Temp 98.6 F (37 C)     Temp Source Oral     SpO2 94 %     Weight      Height      Head Circumference      Peak Flow      Pain Score 2     Pain Loc      Pain Edu?      Excl. in [place]?    No data found.  Updated Vital Signs BP (!) 151/85 (BP Location: Left Arm)   Pulse 89   Temp 98.6 F (37 C) (Oral)   Resp 18   LMP 12/26/2012   SpO2 94%   Visual Acuity Right Eye Distance:   Left Eye Distance:   Bilateral Distance:    Right Eye Near:   Left Eye Near:    Bilateral Near:     Physical Exam Vitals reviewed.  Constitutional:      General: She is awake. She is not in acute distress.    Appearance: Normal appearance. She is well-developed. She is not ill-appearing.     Comments: Very pleasant female present age in no acute distress  HENT:     Head: Normocephalic and atraumatic.     Right Ear: Tympanic membrane, ear canal and external ear normal. Tympanic membrane is not erythematous or bulging.     Left Ear: Tympanic membrane, ear canal and external ear normal. Tympanic membrane is not erythematous or bulging.     Nose:     Right Sinus: No maxillary sinus tenderness or frontal sinus tenderness.     Left Sinus: No maxillary sinus tenderness or frontal sinus tenderness.     Mouth/Throat:     Pharynx: Uvula midline. Posterior oropharyngeal erythema present. No oropharyngeal exudate.  Cardiovascular:     Rate and Rhythm: Normal rate and regular rhythm.     Heart sounds: Normal heart sounds, S1 normal and S2 normal. No murmur heard. Pulmonary:     Effort: Pulmonary effort is normal.     Breath sounds: Rhonchi present.  No wheezing or rales.     Comments: Scattered rhonchi clear with cough Psychiatric:        Behavior: Behavior is cooperative.     UC Treatments / Results  Labs (all labs ordered are listed, but only abnormal results  are displayed) Labs Reviewed - No data to display  EKG   Radiology No results found.  Procedures Procedures (including critical care time)  Medications Ordered in UC Medications - No data to display  Initial Impression / Assessment and Plan / UC Course  I have reviewed the triage vital signs and the nursing notes.  Pertinent labs & imaging results that were available during my care of the patient were reviewed by me and considered in my medical decision making (see chart for details).     Vital signs and physical exam reassuring today.  Given worsening cough will cover with Levaquin due to patient's allergies.  Initially discussed symptoms utility of prednisone but given we are treating with fluoroquinolone discussed that we cannot dispense at this point time.  Patient will continue albuterol as needed.  Recommended over-the-counter medications including Tylenol and ibuprofen as well as Mucinex and Flonase.  Discussed that we do not complete FMLA paperwork but she was provided work excuse note that mentioned she was seen previously.  Discussed alarm symptoms that warrant emergent evaluation.  Strict return precautions given to which she expressed understanding.  Final Clinical Impressions(s) / UC Diagnoses   Final diagnoses:  Sinobronchitis  Acute cough     Discharge Instructions      Start Levaquin 500 mg once a day for 5 days.  Continue using your albuterol inhaler as needed.  Make sure you rest and drink plenty of fluid.  Follow-up with your primary care provider as we discussed.  If symptoms or not improving please return for reevaluation.  If you have any worsening symptoms including severe shortness of breath, fever, worsening cough, chest discomfort you  need to go to the emergency room.     ED Prescriptions     Medication Sig Dispense Auth. Provider   levofloxacin (LEVAQUIN) 500 MG tablet Take 1 tablet (500 mg total) by mouth daily. 5 tablet Julio Storr, Derry Skill, PA-C      PDMP not reviewed this encounter.   Terrilee Croak, Hershal Coria 10/26/20 2138

## 2020-10-26 NOTE — Discharge Instructions (Signed)
Start Levaquin 500 mg once a day for 5 days.  Continue using your albuterol inhaler as needed.  Make sure you rest and drink plenty of fluid.  Follow-up with your primary care provider as we discussed.  If symptoms or not improving please return for reevaluation.  If you have any worsening symptoms including severe shortness of breath, fever, worsening cough, chest discomfort you need to go to the emergency room.

## 2020-10-26 NOTE — ED Triage Notes (Addendum)
Pt states tx here on 10/16 for bronchitis and stills feels the same and unable to work. C/o cough, wheezing, chest tightness, and lt ear pain

## 2020-12-05 ENCOUNTER — Other Ambulatory Visit: Payer: Self-pay | Admitting: Family Medicine

## 2020-12-21 ENCOUNTER — Other Ambulatory Visit: Payer: Self-pay | Admitting: Family Medicine

## 2021-01-11 ENCOUNTER — Ambulatory Visit: Payer: Self-pay

## 2021-01-11 ENCOUNTER — Other Ambulatory Visit: Payer: Self-pay

## 2021-01-11 ENCOUNTER — Ambulatory Visit: Payer: BC Managed Care – PPO | Admitting: Family Medicine

## 2021-01-11 VITALS — BP 142/88 | HR 104 | Ht 63.0 in | Wt 199.8 lb

## 2021-01-11 DIAGNOSIS — M76822 Posterior tibial tendinitis, left leg: Secondary | ICD-10-CM

## 2021-01-11 NOTE — Patient Instructions (Addendum)
Thank you for coming in today.   You received a steroid injection today. Seek immediate medical attention if the joint becomes red, extremely painful, or is oozing fluid.   I've referred you to Physical Therapy.  Let us know if you don't hear from them in one week.   Please go to [facility] supply to get the CAM walker boot we talked about today. You may also be able to get it from [employer].    Recheck back in 6 weeks.

## 2021-01-11 NOTE — Progress Notes (Signed)
I, Peterson Lombard, LAT, ATC acting as a scribe for [name], MD.  Natasha Fitzgerald is a 56 y.o. female who presents to [facility] [facility] today for continued L foot and ankle pain. Pt was last seen by Dr. Georgina Snell on 06/08/20 for this issue in the R foot/ankle and was advised to use Voltaren gel, HEP and compression. Today, pt reports she had been getting better, but then had the wound issue in the spring of 2022 and didn't go back to PT. Pt has switched job and is not working at [employer] and is constantly on her feet. Pt has been wearing the compression sleeve and Voltaren gel, but they are not helping. Pt locates pain to the medial aspect of the L foot and ankle.   Dx imaging: 11/25/18 R foot XR  Pertinent review of systems: No fevers or chills  Relevant historical information: History of posterior tibialis tendinitis did well with injection March 2022.   Exam:  BP (!) 142/88    Pulse (!) 104    Ht 5\' 3"  (1.6 m)    Wt 199 lb 12.8 oz (90.6 kg)    LMP 12/26/2012    SpO2 97%    BMI 35.39 kg/m  General: Well Developed, well nourished, and in no acute distress.   MSK: Left ankle normal-appearing Tender palpation medial ankle near the insertion of the posterior tibialis tendon..  Normal foot and ankle motion.  Pain with resisted foot inversion.  Pulses capillary fill and sensation are intact distally.    Lab and Radiology Results  Procedure: Real-time Ultrasound Guided Injection of left posterior tibialis tendon sheath Device: Philips Affiniti 50G Images permanently stored and available for review in PACS Verbal informed consent obtained.  Discussed risks and benefits of procedure. Warned about infection bleeding damage to structures skin hypopigmentation and fat atrophy among others. Patient expresses understanding and agreement Time-out conducted.   Noted no overlying erythema, induration, or other signs of local infection.   Skin prepped in a  sterile fashion.   Local anesthesia: Topical Ethyl chloride.   With sterile technique and under real time ultrasound guidance: 40 mg of Kenalog and 2 mL of Marcaine injected into the posterior tibialis tendon sheath. Fluid seen entering the tendon sheath.   Completed without difficulty   Pain immediately resolved suggesting accurate placement of the medication.   Advised to call if fevers/chills, erythema, induration, drainage, or persistent bleeding.   Images permanently stored and available for review in the ultrasound unit.  Impression: Technically successful ultrasound guided injection.      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 We will proceed with steroid injection again today.  We will mobilize with a cam walker boot and resume home exercise program working on posterior tibialis exercises after a few weeks.  The some refer back to PT.  Reassess back in about 6 weeks.   PDMP not reviewed this encounter. Orders Placed This Encounter  Procedures   [place] LIMITED JOINT SPACE STRUCTURES LOW LEFT(NO LINKED CHARGES)    Standing Status:   Future    Number of Occurrences:   1    Standing Expiration Date:   07/11/2021    Order Specific Question:   Reason for Exam (SYMPTOM  OR DIAGNOSIS REQUIRED)    Answer:   left ankle pain    Order Specific Question:   Preferred imaging location?  Answer:   [place]   Ambulatory referral to Physical Therapy    Referral Priority:   Routine    Referral Type:   Physical Medicine    Referral Reason:   Specialty Services Required    Requested Specialty:   Physical Therapy    Number of Visits Requested:   1   No orders of the defined types were placed in this encounter.    Discussed warning signs or symptoms. Please see discharge instructions. Patient expresses understanding.   The above documentation has been reviewed and is accurate and  complete [name], M.D.

## 2021-02-01 ENCOUNTER — Encounter: Payer: Self-pay | Admitting: Physical Therapy

## 2021-02-01 ENCOUNTER — Ambulatory Visit: Payer: BC Managed Care – PPO | Admitting: Physical Therapy

## 2021-02-01 ENCOUNTER — Other Ambulatory Visit: Payer: Self-pay

## 2021-02-01 DIAGNOSIS — M25572 Pain in left ankle and joints of left foot: Secondary | ICD-10-CM

## 2021-02-01 DIAGNOSIS — R262 Difficulty in walking, not elsewhere classified: Secondary | ICD-10-CM

## 2021-02-01 DIAGNOSIS — M6281 Muscle weakness (generalized): Secondary | ICD-10-CM | POA: Diagnosis not present

## 2021-02-01 NOTE — Therapy (Signed)
OUTPATIENT PHYSICAL THERAPY LOWER EXTREMITY EVALUATION   Patient Name: Natasha Fitzgerald MRN: [id number] DOB:1965/08/31, 56 y.o., female Today's Date: 02/01/2021   PT End of Session - 02/01/21 1619     Visit Number 1    Number of Visits 12    Date for PT Re-Evaluation 03/15/21    PT Start Time 1518    PT Stop Time 1605    PT Time Calculation (min) 47 min             Past Medical History:  Diagnosis Date   Anxiety    Chronic headaches 09/28/2009   DEPRESSION 09/28/2009   Fibromyalgia    History of kidney stones    HYPERTENSION 09/28/2009   IRREGULAR MENSES 09/28/2009   RUQ pain    Thyroid disease    Past Surgical History:  Procedure Laterality Date   [place]   Patient Active Problem List   Diagnosis Date Noted   Fibromyalgia 05/06/2020   GERD (gastroesophageal reflux disease) 03/27/2019   Depression, recurrent ([place]) 11/25/2018   Onychomycosis 07/06/2013   Obesity (BMI 30-39.9) 07/06/2013   Osteoarthritis, multiple sites 04/21/2012   Hypothyroid 03/07/2011   Biliary dyskinesia 07/04/2010   Essential hypertension 09/28/2009   IRREGULAR MENSES 09/28/2009   Headache 09/28/2009    PCP: Eulas Post, MD  REFERRING PROVIDER: Gregor Hams, MD  REFERRING DIAG: [phone] (ICD-10-CM) - Posterior tibial tendinitis of left lower extremity  THERAPY DIAG:  Pain in left ankle and joints of left foot  Muscle weakness (generalized)  Difficulty in walking, not elsewhere classified  ONSET DATE: Over a year ago  SUBJECTIVE:   SUBJECTIVE STATEMENT: Pt presents to PT with L posterior tibial tendonitis, that began over a year ago and was exacerbated by a fall on May 01, 2020. Pt has attempted physical therapy before for this condition, however did not complete the POC because of the fall. 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   PERTINENT HISTORY: HTN, fibromyalgia, OA, Obesity, Depression   PAIN:  Are you having  pain? Yes NPRS scale: 10/10 after standing all day, 1/10 at rest Pain location: left ankle/ foot PAIN TYPE: aching, sharp, and tight Aggravating factors: Standing at work all day, Walking for long periods of time  Relieving factors: Over- the- counter medication, rest  PRECAUTIONS: None  WEIGHT BEARING RESTRICTIONS No  FALLS:  Has patient fallen in last 6 months? No, Number of falls: N/A Pt did state she has a fear of falling.   LIVING ENVIRONMENT: Lives with: lives with their family Lives in: House/apartment Has following equipment at home: None  OCCUPATION: Metallurgist   PLOF: Independent  PATIENT GOALS: Feel stronger, decrease pain, become more active   OBJECTIVE:   PATIENT SURVEYS:  FOTO 40  COGNITION:  Overall cognitive status: Within functional limits for tasks assessed     SENSATION:  Light touch: Appears intact  Stereognosis: Appears intact  Hot/Cold: Appears intact  Proprioception: Appears intact  PALPATION: Tenderness along medial aspect of L ankle, and medial aspect of L leg  LE AROM/PROM:  A/PROM Right 02/01/2021 Left 02/01/2021  Ankle dorsiflexion WNL 10  Ankle plantarflexion WNL 40  Ankle inversion WNL 25  Ankle eversion WNL 10   (Blank rows = not tested)  LE MMT:  MMT Right 02/01/2021 Left 02/01/2021  Ankle dorsiflexion 5 4+  Ankle plantarflexion 5 4-  Ankle inversion 5 4  Ankle eversion 5 4-   (  Blank rows = not tested)  LOWER EXTREMITY SPECIAL TESTS:  Ankle special tests: Great toe extension test: positive   FUNCTIONAL TESTS:  Single Leg Balance: w/o UE support L leg 2 seconds.  Tandem Leg Balance: w/o UE support L leg forward 6 seconds  GAIT: Assistive device utilized: None Comments: pronation and toeing out with gait bilat    TODAY'S TREATMENT: Today we went over pt's HEP and interventions that can be used at future sessions, such as dry needling.    PATIENT EDUCATION:  Education details: HEP  Person educated:  Patient Education method: Explanation, Demonstration, Corporate treasurer cues, and Verbal cues Education comprehension: verbalized understanding, returned demonstration, verbal cues required, and needs further education   HOME EXERCISE PROGRAM: Access Code: [email or web address] URL: [email or web address] Date: 02/01/2021 Prepared by: [name]  Exercises Ankle and Toe Plantarflexion with Resistance - 2 x daily - 6 x weekly - 2 sets - 5 reps - 20-30sec. hold isometric Ankle Inversion at Wall - 2 x daily - 6 x weekly - 2 sets - 10 reps - 20-30sec. hold Gastroc Stretch on Wall - 1 x daily - 3 x weekly - 1 sets - 3 reps - 30 hold Arch Lifting - 2 x daily - 6 x weekly - 2 sets - 10 reps - 5 sec. hold Heel Raises with Counter Support - 2 x daily - 6 x weekly - 2 sets - 10 reps Heel Toe Raises with Counter Support - 2 x daily - 6 x weekly - 2 sets - 10 reps   ASSESSMENT:  CLINICAL IMPRESSION: Patient presents with signs and symptoms consistent with Lt ankle pain and weakness with posterior tibialis tendonopathy. impairments include decreased activity tolerance, difficulty walking, decreased balance, decreased endurance, decreased mobility, decreased ROM, decreased strength, impaired flexibility, impaired LE use, postural dysfunction, and pain. These impairments are limiting patient from activity/participation in bending, lifting,  locomotion, cleaning, community activity, driving, and or occupation. Personal factors and comorbidities including HTN, fibromyalgia, OA, Obesity, Depression are also affecting patient's functional outcome. Patient will benefit from skilled PT to address above impairments and improve overall function.  REHAB POTENTIAL: Good  CLINICAL DECISION MAKING: stable/uncomplicated  EVALUATION COMPLEXITY: Low   GOALS: Short term PT Goals (target date for Short term goals are 4 weeks 03/01/21) Pt will be I and compliant with HEP. Baseline:  Goal status: New Pt will decrease pain  by 25% overall Baseline: Goal status: New  Long term PT goals (target dates for all long term goals are 8 weeks 03/24/21) Pt will improve Lt ankle ROM to [facility] to improve functional mobility Baseline: Goal status: New Pt will improve  Lt ankle strength to at least 5-/5 MMT in supine to improve functional strength Baseline: Goal status: New Pt will improve FOTO to at least 52% functional to show improved function Baseline: Goal status: New Pt will reduce pain by overall 50% overall with usual activity Baseline: Goal status: New Pt will be able to ambulate community distances at least 1000 ft WNL gait pattern with minimal complaints Baseline: Goal status: New  PLAN: PT FREQUENCY: 1-2 times per week   PT DURATION: 6-8 weeks  PLANNED INTERVENTIONS (unless contraindicated): aquatic PT, Canalith repositioning, cryotherapy, Electrical stimulation, Iontophoresis with 4 mg/ml dexamethasome, Moist heat, traction, Ultrasound, gait training, Therapeutic exercise, balance training, neuromuscular re-education, patient/family education, prosthetic training, manual techniques, passive ROM, dry needling, taping, vasopnuematic device, vestibular, spinal manipulations, joint manipulations  PLAN FOR NEXT SESSION: Reassess pt's HEP, progress exercises to increase L ankle strength  and ROM      Lauralie Blacksher Singer, Student-PT 02/01/2021, 5:00 PM

## 2021-02-08 ENCOUNTER — Encounter: Payer: BC Managed Care – PPO | Admitting: Physical Therapy

## 2021-02-14 ENCOUNTER — Ambulatory Visit: Payer: BC Managed Care – PPO | Admitting: Physical Therapy

## 2021-02-14 ENCOUNTER — Other Ambulatory Visit: Payer: Self-pay

## 2021-02-14 ENCOUNTER — Encounter: Payer: Self-pay | Admitting: Physical Therapy

## 2021-02-14 DIAGNOSIS — M25572 Pain in left ankle and joints of left foot: Secondary | ICD-10-CM

## 2021-02-14 DIAGNOSIS — R262 Difficulty in walking, not elsewhere classified: Secondary | ICD-10-CM | POA: Diagnosis not present

## 2021-02-14 DIAGNOSIS — M6281 Muscle weakness (generalized): Secondary | ICD-10-CM | POA: Diagnosis not present

## 2021-02-14 NOTE — Therapy (Signed)
OUTPATIENT PHYSICAL THERAPY TREATMENT NOTE   Patient Name: Amyri Frenz MRN: [id number] DOB:07-01-1965, 56 y.o., female Today's Date: 02/15/2021  PCP: Eulas Post, MD REFERRING PROVIDER: Gregor Hams, MD   PT End of Session - 02/14/21 1710     Visit Number 2    Number of Visits 12    Date for PT Re-Evaluation 03/15/21    PT Start Time 7782    PT Stop Time 1600    PT Time Calculation (min) 45 min             Past Medical History:  Diagnosis Date   Anxiety    Chronic headaches 09/28/2009   DEPRESSION 09/28/2009   Fibromyalgia    History of kidney stones    HYPERTENSION 09/28/2009   IRREGULAR MENSES 09/28/2009   RUQ pain    Thyroid disease    Past Surgical History:  Procedure Laterality Date   [place]   Patient Active Problem List   Diagnosis Date Noted   Fibromyalgia 05/06/2020   GERD (gastroesophageal reflux disease) 03/27/2019   Depression, recurrent ([place]) 11/25/2018   Onychomycosis 07/06/2013   Obesity (BMI 30-39.9) 07/06/2013   Osteoarthritis, multiple sites 04/21/2012   Hypothyroid 03/07/2011   Biliary dyskinesia 07/04/2010   Essential hypertension 09/28/2009   IRREGULAR MENSES 09/28/2009   Headache 09/28/2009   PCP: Eulas Post, MD   REFERRING PROVIDER: Gregor Hams, MD   REFERRING DIAG: [phone] (ICD-10-CM) - Posterior tibial tendinitis of left lower extremity   THERAPY DIAG:  Pain in left ankle and joints of left foot   Muscle weakness (generalized)   Difficulty in walking, not elsewhere classified   ONSET DATE: Over a year ago   SUBJECTIVE:    SUBJECTIVE STATEMENT: Pt. reports this is the best her pain has felt in over 2 years. She states the pain at her L ankle is 0.5/10. She believes the HEP has been helping and she has gotten new shoes with better arch support.    PERTINENT HISTORY: HTN, fibromyalgia, OA, Obesity, Depression    PAIN:  Are you having pain? Yes NPRS scale:  1/10 at rest Pain location: left ankle/ foot PAIN TYPE: aching, sharp, and tight Aggravating factors: Standing at work all day, Walking for long periods of time  Relieving factors: Over- the- counter medication, rest   PRECAUTIONS: None   WEIGHT BEARING RESTRICTIONS No     OCCUPATION: Metallurgist    PLOF: Independent   PATIENT GOALS: Feel stronger, decrease pain, become more active     OBJECTIVE:      COGNITION:          Overall cognitive status: Within functional limits for tasks assessed                          PALPATION: Tenderness along medial aspect of L ankle, and medial aspect of L leg   LE AROM/PROM:   A/PROM Right 02/01/2021 Left 02/01/2021  Ankle dorsiflexion WNL 10  Ankle plantarflexion WNL 40  Ankle inversion WNL 25  Ankle eversion WNL 10   (Blank rows = not tested)   LE MMT:   MMT Right 02/01/2021 Left 02/01/2021  Ankle dorsiflexion 5 4+  Ankle plantarflexion 5 4-  Ankle inversion 5 4  Ankle eversion 5 4-   (Blank rows = not tested)   LOWER EXTREMITY SPECIAL TESTS:  Ankle special tests: Great toe extension test: positive  FUNCTIONAL TESTS:  Single Leg Balance: w/o UE support L leg 2 seconds.  Tandem Leg Balance: w/o UE support L leg forward 6 seconds  GAIT: Assistive device utilized: None Comments: pronation and toeing out with gait bilat   Therapeutic Exercise: 02/13/2021  Aerobic: Nustep lvl 4, Seat 6, 6 minutes Supine: Prone:  Seated: Plantarflexion isometrics pushing into ball on ground L LE only 10 x 10 sec. Hold;Inversion isometrics pushing into ball with other foot stabilizing L LE only 10 x 10 sec. Hold, BAPS board plantarflexion and dorsiflexion with lvl 3, 2.5# 3 x 30 sec. and Inversion and Eversion 3 x 30 sec.   Standing: Gastroc stretch 3 x 30 second hold; Soleus stretch 2 x 30 second hold; Heel raises off of 6 inch step (emphasizing dorsiflexion into plantarflexion) 2 x 10. Neuromuscular Re-education: Manual Therapy: Therapeutic  Activity: Self Care: Trigger Point Dry Needling:  Modalities:         PATIENT EDUCATION:  Education details: HEP  Person educated: Patient Education method: Explanation, Demonstration, Tactile cues, and Verbal cues Education comprehension: verbalized understanding, returned demonstration, verbal cues required, and needs further education     HOME EXERCISE PROGRAM: Access Code: [email or web address] URL: [email or web address] Date: 02/01/2021 Prepared by: [name]   Exercises Ankle and Toe Plantarflexion with Resistance - 2 x daily - 6 x weekly - 2 sets - 5 reps - 20-30sec. hold isometric Ankle Inversion at Wall - 2 x daily - 6 x weekly - 2 sets - 10 reps - 20-30sec. hold Gastroc Stretch on Wall - 1 x daily - 3 x weekly - 1 sets - 3 reps - 30 hold Arch Lifting - 2 x daily - 6 x weekly - 2 sets - 10 reps - 5 sec. hold Heel Raises with Counter Support - 2 x daily - 6 x weekly - 2 sets - 10 reps Heel Toe Raises with Counter Support - 2 x daily - 6 x weekly - 2 sets - 10 reps     ASSESSMENT:   CLINICAL IMPRESSION: Pt. tolerated today's session well. Pt. was able to progress exercise prescriptions today, due to decreased pain in the L ankle. Session focused on increasing L ankle strength and ROM, with stretching, isometric, and stabilization exercises. We will continue to progress these exercises throughout her PT sessions.    REHAB POTENTIAL: Good   CLINICAL DECISION MAKING: stable/uncomplicated   EVALUATION COMPLEXITY: Low     GOALS: Short term PT Goals (target date for Short term goals are 4 weeks 03/01/21) Pt will be I and compliant with HEP. Baseline:  Goal status: New Pt will decrease pain by 25% overall Baseline: Goal status: New   Long term PT goals (target dates for all long term goals are 8 weeks 03/24/21) Pt will improve Lt ankle ROM to [facility] to improve functional mobility Baseline: Goal status: New Pt will improve  Lt ankle strength to at least 5-/5 MMT in  supine to improve functional strength Baseline: Goal status: New Pt will improve FOTO to at least 52% functional to show improved function Baseline: Goal status: New Pt will reduce pain by overall 50% overall with usual activity Baseline: Goal status: New Pt will be able to ambulate community distances at least 1000 ft WNL gait pattern with minimal complaints Baseline: Goal status: New   PLAN: PT FREQUENCY: 1-2 times per week    PT DURATION: 6-8 weeks   PLANNED INTERVENTIONS (unless contraindicated): aquatic PT, Canalith repositioning, cryotherapy, Electrical stimulation, Iontophoresis with 4 mg/ml  dexamethasome, Moist heat, traction, Ultrasound, gait training, Therapeutic exercise, balance training, neuromuscular re-education, patient/family education, prosthetic training, manual techniques, passive ROM, dry needling, taping, vasopnuematic device, vestibular, spinal manipulations, joint manipulations   PLAN FOR NEXT SESSION: Reassess pt's HEP, progress exercises to increase L ankle strength and ROM     Ysidro Evert, SPT

## 2021-02-20 ENCOUNTER — Encounter: Payer: Self-pay | Admitting: Physical Therapy

## 2021-02-20 ENCOUNTER — Ambulatory Visit: Payer: BC Managed Care – PPO | Admitting: Physical Therapy

## 2021-02-20 ENCOUNTER — Other Ambulatory Visit: Payer: Self-pay

## 2021-02-20 DIAGNOSIS — R262 Difficulty in walking, not elsewhere classified: Secondary | ICD-10-CM | POA: Diagnosis not present

## 2021-02-20 DIAGNOSIS — M25572 Pain in left ankle and joints of left foot: Secondary | ICD-10-CM

## 2021-02-20 DIAGNOSIS — M6281 Muscle weakness (generalized): Secondary | ICD-10-CM

## 2021-02-20 NOTE — Therapy (Signed)
OUTPATIENT PHYSICAL THERAPY TREATMENT NOTE   Patient Name: Cleone Hulick MRN: [id number] DOB:Dec 05, 1965, 56 y.o., female Today's Date: 02/20/2021  PCP: Eulas Post, MD REFERRING PROVIDER: Gregor Hams, MD   PT End of Session - 02/20/21 1621     Visit Number 3    Number of Visits 12    Date for PT Re-Evaluation 03/15/21    PT Start Time 6767    PT Stop Time 1642    PT Time Calculation (min) 38 min    Activity Tolerance Patient tolerated treatment well    Behavior During Therapy [facility] for tasks assessed/performed             Past Medical History:  Diagnosis Date   Anxiety    Chronic headaches 09/28/2009   DEPRESSION 09/28/2009   Fibromyalgia    History of kidney stones    HYPERTENSION 09/28/2009   IRREGULAR MENSES 09/28/2009   RUQ pain    Thyroid disease    Past Surgical History:  Procedure Laterality Date   [place]   Patient Active Problem List   Diagnosis Date Noted   Fibromyalgia 05/06/2020   GERD (gastroesophageal reflux disease) 03/27/2019   Depression, recurrent ([place]) 11/25/2018   Onychomycosis 07/06/2013   Obesity (BMI 30-39.9) 07/06/2013   Osteoarthritis, multiple sites 04/21/2012   Hypothyroid 03/07/2011   Biliary dyskinesia 07/04/2010   Essential hypertension 09/28/2009   IRREGULAR MENSES 09/28/2009   Headache 09/28/2009   PCP: Eulas Post, MD   REFERRING PROVIDER: Gregor Hams, MD   REFERRING DIAG: [phone] (ICD-10-CM) - Posterior tibial tendinitis of left lower extremity   THERAPY DIAG:  Pain in left ankle and joints of left foot   Muscle weakness (generalized)   Difficulty in walking, not elsewhere classified   ONSET DATE: Over a year ago   SUBJECTIVE:    SUBJECTIVE STATEMENT: Pt. reports her Lt ankle is doing overall well, she does feel she was worked too hard last time so we will back off some of the exercises this time.   PERTINENT HISTORY: HTN, fibromyalgia, OA, Obesity,  Depression    PAIN:  Are you having pain? Yes NPRS scale: 1/10 at rest Pain location: left ankle/ foot PAIN TYPE: aching, sharp, and tight Aggravating factors: Standing at work all day, Walking for long periods of time  Relieving factors: Over- the- counter medication, rest   PRECAUTIONS: None   WEIGHT BEARING RESTRICTIONS No   PATIENT GOALS: Feel stronger, decrease pain, become more active     OBJECTIVE:     LE AROM/PROM:   A/PROM Right 02/01/2021 Left 02/01/2021   Ankle dorsiflexion WNL 10 12  Ankle plantarflexion WNL 40 45  Ankle inversion WNL 25 WNL  Ankle eversion WNL 10 15    (Blank rows = not tested)   LE MMT:   MMT Right 02/01/2021 Left 02/01/2021 Left 02/20/21  Ankle dorsiflexion 5 4+ 5  Ankle plantarflexion 5 4- 4-  Ankle inversion 5 4 4+  Ankle eversion 5 4- 4   (Blank rows = not tested)    FUNCTIONAL TESTS:  Single Leg Balance: w/o UE support L leg 2 seconds on 02/01/21 and 7 seconds on 02/20/21  Tandem Leg Balance: w/o UE support L leg forward 6 seconds on 02/01/21 and 20 seconds on 02/20/21  Today's treatment 02/20/21  Therapeutic Exercise:  Aerobic: Nustep lvl 4, Seat 6, 6 minutes Supine:ankle 4 way with green X 10 ea Prone:  Seated: Lt LE  Eversion and Inversion isometrics 5 sec X15 ea,   Standing: Gastroc stretch and soleus stretch 3X30 sec ea bilat on slantboard.  Heel/toe raises X 15 bilat Neuromuscular Re-education:SLS 10 sec X 3 bilat, tandem balance 20-30 sec X 2 bilat Manual Therapy:Lt ankle PROM into DF, EV, and circles Therapeutic Activity: Self Care: Trigger Point Dry Needling:  Modalities:    Therapeutic Exercise: 02/13/2021  Aerobic: Nustep lvl 4, Seat 6, 6 minutes Supine: Prone:  Seated: Plantarflexion isometrics pushing into ball on ground L LE only 10 x 10 sec. Hold;Inversion isometrics pushing into ball with other foot stabilizing L LE only 10 x 10 sec. Hold, BAPS board plantarflexion and dorsiflexion with lvl 3, 2.5# 3 x 30 sec. and  Inversion and Eversion 3 x 30 sec.   Standing: Gastroc stretch 3 x 30 second hold; Soleus stretch 2 x 30 second hold; Heel raises off of 6 inch step (emphasizing dorsiflexion into plantarflexion) 2 x 10. Neuromuscular Re-education: Manual Therapy: Therapeutic Activity: Self Care: Trigger Point Dry Needling:  Modalities:         PATIENT EDUCATION:  Education details: HEP  Person educated: Patient Education method: Explanation, Demonstration, Tactile cues, and Verbal cues Education comprehension: verbalized understanding, returned demonstration, verbal cues required, and needs further education     HOME EXERCISE PROGRAM: Access Code: [email or web address] URL: [email or web address] Date: 02/01/2021 Prepared by: [name]   Exercises Ankle and Toe Plantarflexion with Resistance - 2 x daily - 6 x weekly - 2 sets - 5 reps - 20-30sec. hold isometric Ankle Inversion at Wall - 2 x daily - 6 x weekly - 2 sets - 10 reps - 20-30sec. hold Gastroc Stretch on Wall - 1 x daily - 3 x weekly - 1 sets - 3 reps - 30 hold Arch Lifting - 2 x daily - 6 x weekly - 2 sets - 10 reps - 5 sec. hold Heel Raises with Counter Support - 2 x daily - 6 x weekly - 2 sets - 10 reps Heel Toe Raises with Counter Support - 2 x daily - 6 x weekly - 2 sets - 10 reps     ASSESSMENT:   CLINICAL IMPRESSION: She is progressing very well with PT, she is having less overall pain and has made good improvements in her overall Lt ankle ROM, strength, and single leg balance. PT recommending to continue current plan as she is responding well to it.   REHAB POTENTIAL: Good   CLINICAL DECISION MAKING: stable/uncomplicated   EVALUATION COMPLEXITY: Low     GOALS: Short term PT Goals (target date for Short term goals are 4 weeks 03/01/21) Pt will be I and compliant with HEP. Baseline:  Goal status: MET Pt will decrease pain by 25% overall Baseline: Goal status: MET   Long term PT goals (target dates for all long  term goals are 8 weeks 03/24/21) Pt will improve Lt ankle ROM to [facility] to improve functional mobility Baseline: Goal status: ongoing Pt will improve  Lt ankle strength to at least 5-/5 MMT in supine to improve functional strength Baseline: Goal status: ongoing Pt will improve FOTO to at least 52% functional to show improved function Baseline: Goal status: ongoing Pt will reduce pain by overall 50% overall with usual activity Baseline: Goal status: ongoing Pt will be able to ambulate community distances at least 1000 ft WNL gait pattern with minimal complaints Baseline: Goal status: ongoing   PLAN: PT FREQUENCY: 1-2 times per week    PT DURATION: 6-8 weeks  PLANNED INTERVENTIONS (unless contraindicated): aquatic PT, Canalith repositioning, cryotherapy, Electrical stimulation, Iontophoresis with 4 mg/ml dexamethasome, Moist heat, traction, Ultrasound, gait training, Therapeutic exercise, balance training, neuromuscular re-education, patient/family education, prosthetic training, manual techniques, passive ROM, dry needling, taping, vasopnuematic device, vestibular, spinal manipulations, joint manipulations   PLAN FOR NEXT SESSION: what did MD say? [name], PT, DPT 02/20/21 4:23 PM

## 2021-02-22 ENCOUNTER — Ambulatory Visit: Payer: BC Managed Care – PPO | Admitting: Family Medicine

## 2021-02-22 NOTE — Progress Notes (Unsigned)
° °  I, Peterson Lombard, LAT, ATC acting as a scribe for [name], MD.  Natasha Fitzgerald is a 56 y.o. female who presents to [place] at [facility] today for f/u L ankle pain thought to be due to posterior tibial tendonitis. Pt was last seen by Dr. Georgina Snell on 01/11/21 and was given a L posterior tibialis tendon sheath steroid injection and was advised to immobilize w/ a CAM walker boot, resume HEP, and was referred back to PT, completing 3 visits. Today, pt reports  Dx imaging: 07/02/19 L foot XR  Pertinent review of systems: ***  Relevant historical information: ***   Exam:  LMP 12/26/2012  General: Well Developed, well nourished, and in no acute distress.   MSK: ***    Lab and Radiology Results No results found for this or any previous visit (from the past 72 hour(s)). No results found.     Assessment and Plan: 56 y.o. female with ***   PDMP not reviewed this encounter. No orders of the defined types were placed in this encounter.  No orders of the defined types were placed in this encounter.    Discussed warning signs or symptoms. Please see discharge instructions. Patient expresses understanding.   ***

## 2021-02-23 ENCOUNTER — Encounter: Payer: BC Managed Care – PPO | Admitting: Physical Therapy

## 2021-02-27 ENCOUNTER — Encounter: Payer: BC Managed Care – PPO | Admitting: Physical Therapy

## 2021-02-27 ENCOUNTER — Other Ambulatory Visit: Payer: Self-pay

## 2021-02-27 MED ORDER — ESCITALOPRAM OXALATE 10 MG PO TABS: 10.0000 mg | ORAL_TABLET | Freq: Every day | ORAL | 1 refills | Status: DC

## 2021-02-27 MED ORDER — LOSARTAN POTASSIUM 25 MG PO TABS: 50.0000 mg | ORAL_TABLET | Freq: Every day | ORAL | 1 refills | Status: DC

## 2021-02-27 NOTE — Addendum Note (Signed)
Addended by: Elza Rafter D on: 02/27/2021 11:28 AM   Modules accepted: Orders

## 2021-03-01 ENCOUNTER — Encounter: Payer: BC Managed Care – PPO | Admitting: Physical Therapy

## 2021-03-06 ENCOUNTER — Ambulatory Visit: Payer: BC Managed Care – PPO | Admitting: Physical Therapy

## 2021-03-06 ENCOUNTER — Encounter: Payer: Self-pay | Admitting: Physical Therapy

## 2021-03-06 ENCOUNTER — Other Ambulatory Visit: Payer: Self-pay

## 2021-03-06 DIAGNOSIS — M25561 Pain in right knee: Secondary | ICD-10-CM | POA: Diagnosis not present

## 2021-03-06 DIAGNOSIS — M25572 Pain in left ankle and joints of left foot: Secondary | ICD-10-CM | POA: Diagnosis not present

## 2021-03-06 DIAGNOSIS — G8929 Other chronic pain: Secondary | ICD-10-CM

## 2021-03-06 DIAGNOSIS — M6281 Muscle weakness (generalized): Secondary | ICD-10-CM | POA: Diagnosis not present

## 2021-03-06 DIAGNOSIS — R262 Difficulty in walking, not elsewhere classified: Secondary | ICD-10-CM

## 2021-03-06 DIAGNOSIS — M25562 Pain in left knee: Secondary | ICD-10-CM

## 2021-03-06 NOTE — Therapy (Signed)
?OUTPATIENT PHYSICAL THERAPY TREATMENT NOTE/Recert ? ? ?Patient Name: Natasha Fitzgerald ?MRN: [id number] ?DOB:1965/01/23, 56 y.o., female ?Today's Date: 03/06/2021 ? ?PCP: Eulas Post, MD ?REFERRING PROVIDER: Gregor Hams, MD ? ? PT End of Session - 03/06/21 1619   ? ? Visit Number 4   ? Number of Visits 12   ? Date for PT Re-Evaluation 04/03/21   ? PT Start Time 1601   ? PT Stop Time 3474   ? PT Time Calculation (min) 44 min   ? Activity Tolerance Patient tolerated treatment well   ? Behavior During Therapy [facility] for tasks assessed/performed   ? ?  ?  ? ?  ? ? ?Past Medical History:  ?Diagnosis Date  ? Anxiety   ? Chronic headaches 09/28/2009  ? DEPRESSION 09/28/2009  ? Fibromyalgia   ? History of kidney stones   ? HYPERTENSION 09/28/2009  ? IRREGULAR MENSES 09/28/2009  ? RUQ pain   ? Thyroid disease   ? ?Past Surgical History:  ?Procedure Laterality Date  ? [place]  ? TONSILLECTOMY  1973  ? ?Patient Active Problem List  ? Diagnosis Date Noted  ? Fibromyalgia 05/06/2020  ? GERD (gastroesophageal reflux disease) 03/27/2019  ? Depression, recurrent ([place]) 11/25/2018  ? Onychomycosis 07/06/2013  ? Obesity (BMI 30-39.9) 07/06/2013  ? Osteoarthritis, multiple sites 04/21/2012  ? Hypothyroid 03/07/2011  ? Biliary dyskinesia 07/04/2010  ? Essential hypertension 09/28/2009  ? IRREGULAR MENSES 09/28/2009  ? Headache 09/28/2009  ? ?PCP: Eulas Post, MD ?  ?REFERRING PROVIDER: Gregor Hams, MD ?  ?REFERRING DIAG: Q59.563 (ICD-10-CM) - Posterior tibial tendinitis of left lower extremity ?  ?THERAPY DIAG:  ?Pain in left ankle and joints of left foot ?  ?Muscle weakness (generalized) ?  ?Difficulty in walking, not elsewhere classified ?Bilat knee pain ?  ?ONSET DATE: Over a year ago ?  ?SUBJECTIVE:  ?  ?SUBJECTIVE STATEMENT: ?Pt. reports her Lt ankle did bother her some over the last 2 days but it feels better now. She says she has chronic bilat knee pain and would like to incorporate some PT for this as  well. ?PERTINENT HISTORY: ?HTN, fibromyalgia, OA, Obesity, Depression  ?  ?PAIN:  ?Are you having pain? Yes ?NPRS scale: 1/10 at rest ?Pain location: left ankle/ foot ?PAIN TYPE: aching, sharp, and tight ?Aggravating factors: Standing at work all day, Walking for long periods of time  ?Relieving factors: Over- the- counter medication, rest ?  ?PRECAUTIONS: None ?  ?WEIGHT BEARING RESTRICTIONS No ?  ?PATIENT GOALS: Feel stronger, decrease pain, become more active ?  ?  ?OBJECTIVE:  ? ?  ?LE AROM/PROM: ?  ?A/PROM Right ?02/01/2021 Left ?02/01/2021 Left ?02/20/21  ?Ankle dorsiflexion WNL 10 12  ?Ankle plantarflexion WNL 40 45  ?Ankle inversion WNL 25 WNL  ?Ankle eversion WNL 10 15 ?  ? (Blank rows = not tested) ?  ?LE MMT: ?  ?MMT Right ?02/01/2021 Left ?02/01/2021 Left ?02/20/21  ?Ankle dorsiflexion 5 4+ 5  ?Ankle plantarflexion 5 4- 4-  ?Ankle inversion 5 4 4+  ?Ankle eversion 5 4- 4  ? (Blank rows = not tested) ? ?  ?FUNCTIONAL TESTS:  ?Single Leg Balance: w/o UE support L leg 2 seconds on 02/01/21 and 7 seconds on 02/20/21  ?Tandem Leg Balance: w/o UE support L leg forward 6 seconds on 02/01/21 and 20 seconds on 02/20/21 ? ?Today's treatment  ?03/06/21 ?Therapeutic Exercise: ? Aerobic: Nustep lvl 6, Seat 6, 8 minutes UE/LE ?Supine: ankle  4 way with green X 15 ea, knee flexion stretch heelslides AAROM 5 sec X10 bilat, hamstring stretch 30 sec X2 bilat with strap ?Prone: ? Seated: leg press machine DL 75# 3X10  ?Standing: Gastroc stretch and soleus stretch 3X30 sec ea bilat on slantboard.  Heel/toe raises X 15 bilat with UE support  ?Neuromuscular Re-education:tandem walk and grapevine walk at counter top 3 round trips without UE support. ?Manual Therapy: ?Therapeutic Activity: ?Self Care: ?Trigger Point Dry Needling:  ?Modalities:  ? ?02/20/21 ?Therapeutic Exercise: ? Aerobic: Nustep lvl 4, Seat 6, 6 minutes ?Supine:ankle 4 way with green X 10 ea ?Prone: ? Seated: Lt LE Eversion and Inversion isometrics 5 sec X15 ea,   ?Standing:  Gastroc stretch and soleus stretch 3X30 sec ea bilat on slantboard.  Heel/toe raises X 15 bilat ?Neuromuscular Re-education:SLS 10 sec X 3 bilat, tandem balance 20-30 sec X 2 bilat ?Manual Therapy:Lt ankle PROM into DF, EV, and circles ?Therapeutic Activity: ?Self Care: ?Trigger Point Dry Needling:  ?Modalities:  ? ?PATIENT EDUCATION: 03/06/21 ?Education details: HEP progression and revision ?Person educated: Patient ?Education method: Explanation, Demonstration, Tactile cues, and Verbal cues ?Education comprehension: verbalized understanding, returned demonstration,  ?  ?HOME EXERCISE PROGRAM: ?Access Code: [email or web address] ?URL: [email or web address] ?Date: 03/06/2021 ?Prepared by: [name] ? ?Exercises ?Isometric Ankle Inversion at Wall - 2 x daily - 6 x weekly - 2 sets - 10 reps - 20-30sec. hold ?Gastroc Stretch on Wall - 1 x daily - 3 x weekly - 1 sets - 3 reps - 30 hold ?Arch Lifting - 2 x daily - 6 x weekly - 2 sets - 10 reps - 5 sec. hold ?Heel Raises with Counter Support - 2 x daily - 6 x weekly - 2 sets - 10 reps ?Heel Toe Raises with Counter Support - 2 x daily - 6 x weekly - 2 sets - 10 reps ?Tandem Walking with Counter Support - 2 x daily - 6 x weekly - 3 sets ?Braided Sidestepping - 2 x daily - 6 x weekly - 10 reps - 1 sets ?Mini Squat with Counter Support - 2 x daily - 6 x weekly - 1-2 sets - 10 reps ?Supine Heel Slide with Strap - 2 x daily - 6 x weekly - 10 reps - 1-2 sets - 5 hold ?Supine Hamstring Stretch with Strap - 2 x daily - 6 x weekly - 1 sets - 2 reps - 30 hold ? ?  ?  ?ASSESSMENT: ?  ?CLINICAL IMPRESSION: ?She appears to be making progress with her left ankle pain/weakness and balance so progressed her HEP for this. She also complains of chronic knee pain so I will add this into PT plan of care and send off Recert for MD to sign so we can treat her knee pain in addition to continued exercises for her Lt ankle deficits.  ?  ?REHAB POTENTIAL: Good ?  ?CLINICAL DECISION MAKING:  stable/uncomplicated ?  ?EVALUATION COMPLEXITY: Low ?  ?  ?GOALS: ?Short term PT Goals (target date for Short term goals are 4 weeks 03/01/21) ?Pt will be I and compliant with HEP. ?Baseline:  ?Goal status: MET ?Pt will decrease pain by 25% overall ?Baseline: ?Goal status: MET ?  ?Long term PT goals (target dates for all long term goals are 8 weeks 04/03/21) ?Pt will improve Lt ankle ROM to [facility] to improve functional mobility ?Baseline: ?Goal status: ongoing ?Pt will improve  Lt ankle strength to at least 5-/5 MMT in supine to improve functional strength ?  Baseline: ?Goal status: ongoing ?Pt will improve FOTO to at least 52% functional to show improved function ?Baseline: ?Goal status: ongoing ?Pt will reduce pain by overall 50% overall with usual activity ?Baseline: ?Goal status: ongoing ?Pt will be able to ambulate community distances at least 1000 ft WNL gait pattern with minimal complaints ?Baseline: ?Goal status: ongoing ?  ?PLAN: ?PT FREQUENCY: 1-2 times per week  ?  ?PT DURATION: 6-8 weeks ?  ?PLANNED INTERVENTIONS (unless contraindicated): aquatic PT, Canalith repositioning, cryotherapy, Electrical stimulation, Iontophoresis with 4 mg/ml dexamethasome, Moist heat, traction, Ultrasound, gait training, Therapeutic exercise, balance training, neuromuscular re-education, patient/family education, prosthetic training, manual techniques, passive ROM, dry needling, taping, vasopnuematic device, vestibular, spinal manipulations, joint manipulations ?  ?PLAN FOR NEXT SESSION: continue to work left ankle and slowly add in more bilat knee exercises. She returns for MD follow up 3/15 ?[name], PT, DPT ?03/06/21 4:52 PM ? ? ? ?

## 2021-03-08 ENCOUNTER — Ambulatory Visit: Payer: BC Managed Care – PPO | Admitting: Family Medicine

## 2021-03-08 ENCOUNTER — Encounter: Payer: Self-pay | Admitting: Family Medicine

## 2021-03-08 ENCOUNTER — Ambulatory Visit: Payer: BC Managed Care – PPO | Admitting: Physical Therapy

## 2021-03-08 ENCOUNTER — Other Ambulatory Visit: Payer: Self-pay

## 2021-03-08 ENCOUNTER — Encounter: Payer: Self-pay | Admitting: Physical Therapy

## 2021-03-08 VITALS — BP 130/80 | HR 89 | Temp 97.8°F | Ht 63.0 in | Wt 198.0 lb

## 2021-03-08 DIAGNOSIS — R1011 Right upper quadrant pain: Secondary | ICD-10-CM | POA: Diagnosis not present

## 2021-03-08 DIAGNOSIS — G43009 Migraine without aura, not intractable, without status migrainosus: Secondary | ICD-10-CM | POA: Diagnosis not present

## 2021-03-08 DIAGNOSIS — G8929 Other chronic pain: Secondary | ICD-10-CM

## 2021-03-08 DIAGNOSIS — M25561 Pain in right knee: Secondary | ICD-10-CM | POA: Diagnosis not present

## 2021-03-08 DIAGNOSIS — M6281 Muscle weakness (generalized): Secondary | ICD-10-CM | POA: Diagnosis not present

## 2021-03-08 DIAGNOSIS — R262 Difficulty in walking, not elsewhere classified: Secondary | ICD-10-CM

## 2021-03-08 DIAGNOSIS — M25562 Pain in left knee: Secondary | ICD-10-CM

## 2021-03-08 DIAGNOSIS — M25572 Pain in left ankle and joints of left foot: Secondary | ICD-10-CM

## 2021-03-08 MED ORDER — SUMATRIPTAN SUCCINATE 100 MG PO TABS: ORAL_TABLET | ORAL | 2 refills | Status: DC

## 2021-03-08 NOTE — Therapy (Signed)
?OUTPATIENT PHYSICAL THERAPY TREATMENT NOTE/Recert ? ? ?Patient Name: Natasha Fitzgerald ?MRN: [id number] ?DOB:10-18-1965, 56 y.o., female ?Today's Date: 03/08/2021 ? ?PCP: Eulas Post, MD ?REFERRING PROVIDER: Gregor Hams, MD ? ? PT End of Session - 03/08/21 1650   ? ? Visit Number 5   ? Number of Visits 12   ? Date for PT Re-Evaluation 04/03/21   ? PT Start Time 1600   ? PT Stop Time 8453   ? PT Time Calculation (min) 44 min   ? Activity Tolerance Patient tolerated treatment well   ? Behavior During Therapy [facility] for tasks assessed/performed   ? ?  ?  ? ?  ? ? ? ?Past Medical History:  ?Diagnosis Date  ? Anxiety   ? Chronic headaches 09/28/2009  ? DEPRESSION 09/28/2009  ? Fibromyalgia   ? History of kidney stones   ? HYPERTENSION 09/28/2009  ? IRREGULAR MENSES 09/28/2009  ? RUQ pain   ? Thyroid disease   ? ?Past Surgical History:  ?Procedure Laterality Date  ? [place]  ? TONSILLECTOMY  1973  ? ?Patient Active Problem List  ? Diagnosis Date Noted  ? Fibromyalgia 05/06/2020  ? GERD (gastroesophageal reflux disease) 03/27/2019  ? Depression, recurrent ([place]) 11/25/2018  ? Onychomycosis 07/06/2013  ? Obesity (BMI 30-39.9) 07/06/2013  ? Osteoarthritis, multiple sites 04/21/2012  ? Hypothyroid 03/07/2011  ? Biliary dyskinesia 07/04/2010  ? Essential hypertension 09/28/2009  ? IRREGULAR MENSES 09/28/2009  ? Headache 09/28/2009  ? ?PCP: Eulas Post, MD ?  ?REFERRING PROVIDER: Gregor Hams, MD ?  ?REFERRING DIAG: M46.803 (ICD-10-CM) - Posterior tibial tendinitis of left lower extremity ?  ?THERAPY DIAG:  ?Pain in left ankle and joints of left foot ?  ?Muscle weakness (generalized) ?  ?Difficulty in walking, not elsewhere classified ?Bilat knee pain ?  ?ONSET DATE: Over a year ago ?  ?SUBJECTIVE:  ?  ?SUBJECTIVE STATEMENT: ?She relays she had more overall pain this morning but with voltaren gel she now only reports 1/10 pain. She says overall she is very pleased with how her ankle has progressed with  PT. ?PERTINENT HISTORY: ?HTN, fibromyalgia, OA, Obesity, Depression  ?  ?PAIN:  ?Are you having pain? Yes ?NPRS scale: 1/10 at rest ?Pain location: left ankle/ foot and bilat knees ?PAIN TYPE: aching, sharp, and tight ?Aggravating factors: Standing at work all day, Walking for long periods of time  ?Relieving factors: Over- the- counter medication, rest ?  ?PRECAUTIONS: None ?  ?WEIGHT BEARING RESTRICTIONS No ?  ?PATIENT GOALS: Feel stronger, decrease pain, become more active ?  ?  ?OBJECTIVE:  ? ?  ?LE AROM/PROM: ?  ?A/PROM Right ?02/01/2021 Left ?02/01/2021 Left ?02/20/21  ?Ankle dorsiflexion WNL 10 12  ?Ankle plantarflexion WNL 40 45  ?Ankle inversion WNL 25 WNL  ?Ankle eversion WNL 10 15 ?  ? (Blank rows = not tested) ?  ?LE MMT: ?  ?MMT Right ?02/01/2021 Left ?02/01/2021 Left ?02/20/21  ?Ankle dorsiflexion 5 4+ 5  ?Ankle plantarflexion 5 4- 4-  ?Ankle inversion 5 4 4+  ?Ankle eversion 5 4- 4  ? (Blank rows = not tested) ? ?  ?FUNCTIONAL TESTS:  ?Single Leg Balance: w/o UE support L leg 2 seconds on 02/01/21 and 7 seconds on 02/20/21  ?Tandem Leg Balance: w/o UE support L leg forward 6 seconds on 02/01/21 and 20 seconds on 02/20/21 ? ?Today's treatment  ?03/08/21 ?Therapeutic Exercise: ? Aerobic: Nustep lvl 6, Seat 6, 8 minutes UE/LE ?Seated: ankle  4 way with green X 15 ea, LAQ 5# 3X10 bilat  ?Standing: Gastroc stretch and soleus stretch 3X30 sec ea bilat on slantboard.  Walking up ramp 2 times fwd, lateral ea way. Standing H.S curs X 15 bilat with 5#, Standing hip abd with 5# X 10 bilat. Mini squats at counter X 10 ?Neuromuscular Re-education:Heel/toe raises X 15 bilat without UE support on airex. Tandem balance 30 sec X 2 bilat on airex pad. tandem walk and grapevine walk at counter top 2 round trips without UE support. ?Manual Therapy: ?Therapeutic Activity: ?Self Care: ?Trigger Point Dry Needling:  ?Modalities:  ? ?03/06/21 ?Therapeutic Exercise: ? Aerobic: Nustep lvl 6, Seat 6, 8 minutes UE/LE ?Supine: ankle 4 way with green X  15 ea, knee flexion stretch heelslides AAROM 5 sec X10 bilat, hamstring stretch 30 sec X2 bilat with strap ?Prone: ? Seated: leg press machine DL 75# 3X10  ?Standing: Gastroc stretch and soleus stretch 3X30 sec ea bilat on slantboard.  Heel/toe raises X 15 bilat with UE support  ?Neuromuscular Re-education:tandem walk and grapevine walk at counter top 3 round trips without UE support. ?Manual Therapy: ?Therapeutic Activity: ?Self Care: ?Trigger Point Dry Needling:  ?Modalities:  ? ? ?PATIENT EDUCATION: 03/06/21 ?Education details: HEP progression and revision ?Person educated: Patient ?Education method: Explanation, Demonstration, Tactile cues, and Verbal cues ?Education comprehension: verbalized understanding, returned demonstration,  ?  ?HOME EXERCISE PROGRAM: ?Access Code: [email or web address] ?URL: [email or web address] ?Date: 03/06/2021 ?Prepared by: [name] ? ?Exercises ?Isometric Ankle Inversion at Wall - 2 x daily - 6 x weekly - 2 sets - 10 reps - 20-30sec. hold ?Gastroc Stretch on Wall - 1 x daily - 3 x weekly - 1 sets - 3 reps - 30 hold ?Arch Lifting - 2 x daily - 6 x weekly - 2 sets - 10 reps - 5 sec. hold ?Heel Raises with Counter Support - 2 x daily - 6 x weekly - 2 sets - 10 reps ?Heel Toe Raises with Counter Support - 2 x daily - 6 x weekly - 2 sets - 10 reps ?Tandem Walking with Counter Support - 2 x daily - 6 x weekly - 3 sets ?Braided Sidestepping - 2 x daily - 6 x weekly - 10 reps - 1 sets ?Mini Squat with Counter Support - 2 x daily - 6 x weekly - 1-2 sets - 10 reps ?Supine Heel Slide with Strap - 2 x daily - 6 x weekly - 10 reps - 1-2 sets - 5 hold ?Supine Hamstring Stretch with Strap - 2 x daily - 6 x weekly - 1 sets - 2 reps - 30 hold ? ?  ?  ?ASSESSMENT: ?  ?CLINICAL IMPRESSION: ?She has made excellent progress overall progress with her Lt ankle with PT. Her biggest complaint now is her bilateral knee pain and weakness which we will continue to work on with PT. ?  ?REHAB POTENTIAL:  Good ?  ?CLINICAL DECISION MAKING: stable/uncomplicated ?  ?EVALUATION COMPLEXITY: Low ?  ?  ?GOALS: ?Short term PT Goals (target date for Short term goals are 4 weeks 03/01/21) ?Pt will be I and compliant with HEP. ?Baseline:  ?Goal status: MET ?Pt will decrease pain by 25% overall ?Baseline: ?Goal status: MET ?  ?Long term PT goals (target dates for all long term goals are 8 weeks 04/03/21) ?Pt will improve Lt ankle ROM to [facility] to improve functional mobility ?Baseline: ?Goal status: ongoing ?Pt will improve  Lt ankle strength to at least 5-/5 MMT in supine  to improve functional strength ?Baseline: ?Goal status: ongoing ?Pt will improve FOTO to at least 52% functional to show improved function ?Baseline: ?Goal status: ongoing ?Pt will reduce pain by overall 50% overall with usual activity ?Baseline: ?Goal status: ongoing ?Pt will be able to ambulate community distances at least 1000 ft WNL gait pattern with minimal complaints ?Baseline: ?Goal status: ongoing ?  ?PLAN: ?PT FREQUENCY: 1-2 times per week  ?  ?PT DURATION: 6-8 weeks ?  ?PLANNED INTERVENTIONS (unless contraindicated): aquatic PT, Canalith repositioning, cryotherapy, Electrical stimulation, Iontophoresis with 4 mg/ml dexamethasome, Moist heat, traction, Ultrasound, gait training, Therapeutic exercise, balance training, neuromuscular re-education, patient/family education, prosthetic training, manual techniques, passive ROM, dry needling, taping, vasopnuematic device, vestibular, spinal manipulations, joint manipulations ?  ?PLAN FOR NEXT SESSION: continue to work left ankle and bilat knee exercises. She returns for MD follow up 3/15 ? ?[name], PT, DPT ?03/08/21 4:51 PM ? ? ? ?

## 2021-03-08 NOTE — Progress Notes (Signed)
Established Patient Office Visit  Subjective:  Patient ID: Natasha Fitzgerald, female    DOB: [birth year]  Age: 56 y.o. MRN: [id number]  CC:  Chief Complaint  Patient presents with   Migraine    Patient complains of Migraine, x2 months, Tried Tylenol and Ibuprofen with little relief   Otalgia    HPI Natasha Fitzgerald presents for the following issues  Her main concern is headaches.  She states for past couple months she has had up to 3 headaches per week.  These tend to be mostly unilateral but sometimes bilateral.  She frequently has light sensitivity and sound sensitivity.  She has throbbing quality with sometimes associated nausea but no vomiting.  They can last for hours.  Sleep sometimes helps.  Not clear regarding specific triggers though she tends to sleep very poorly sometimes only getting a few hours at night.  She had some head trauma which was closed head trauma back in 2011 and feels like she has had worse headaches since then.  She sometimes takes Tylenol or Excedrin Migraine but minimal relief.  She is not aware of any specific food triggers.  No known family history of migraine headaches.  No hormone use at this time.  Patient also relates intermittent right upper quadrant pain sometimes after eating.  She had work-up back in 2012 Ultrasound back then showed no obvious gallbladder issues.  She had subsequent nuclear medicine hepatobiliary imaging with gallbladder EF which showed impaired EF of 24%.  She denies any recent fever.  Colonoscopy up-to-date.  She specifically would like to see GI at this time.  Past Medical History:  Diagnosis Date   Anxiety    Chronic headaches 09/28/2009   DEPRESSION 09/28/2009   Fibromyalgia    History of kidney stones    HYPERTENSION 09/28/2009   IRREGULAR MENSES 09/28/2009   RUQ pain    Thyroid disease     Past Surgical History:  Procedure Laterality Date   CESAREAN SECTION  1997   TONSILLECTOMY  1973    Family History   Problem Relation Age of Onset   Hypertension Mother    Hashimoto's thyroiditis Sister    Breast cancer Maternal Grandmother    Heart disease Maternal Grandfather    Cancer Paternal Grandmother        breast cancer   Heart disease Paternal Grandfather    Colon cancer Neg Hx    Colon polyps Neg Hx    Esophageal cancer Neg Hx    Rectal cancer Neg Hx    Stomach cancer Neg Hx     Social History   Socioeconomic History   Marital status: Married    Spouse name: Not on file   Number of children: Not on file   Years of education: Not on file   Highest education level: Not on file  Occupational History   Not on file  Tobacco Use   Smoking status: Never   Smokeless tobacco: Never  Vaping Use   Vaping Use: Never used  Substance and Sexual Activity   Alcohol use: Yes    Comment: occ   Drug use: No   Sexual activity: Not on file  Other Topics Concern   Not on file  Social History Narrative   Not on file   Social Determinants of Health   Financial Resource Strain: Not on file  Food Insecurity: Not on file  Transportation Needs: Not on file  Physical Activity: Not on file  Stress: Not on file  Social  Connections: Not on file  Intimate Partner Violence: Not on file    Outpatient Medications Prior to Visit  Medication Sig Dispense Refill   acetaminophen (TYLENOL) 500 MG tablet Take 500 mg by mouth every 6 (six) hours as needed.     albuterol (VENTOLIN HFA) 108 (90 Base) MCG/ACT inhaler Inhale 1-2 puffs into the lungs every 6 (six) hours as needed for wheezing or shortness of breath. 18 g 0   diclofenac Sodium (VOLTAREN) 1 % GEL Apply 2 g topically 4 (four) times daily. 100 g 1   escitalopram (LEXAPRO) 10 MG tablet Take 1 tablet (10 mg total) by mouth daily. 90 tablet 1   levothyroxine (SYNTHROID) 88 MCG tablet TAKE 1 TABLET BY MOUTH DAILY BEFORE BREAKFAST. 90 tablet 1   losartan (COZAAR) 25 MG tablet Take 2 tablets (50 mg total) by mouth daily. 180 tablet 1   levofloxacin  (LEVAQUIN) 500 MG tablet Take 1 tablet (500 mg total) by mouth daily. 5 tablet 0   No facility-administered medications prior to visit.    Allergies  Allergen Reactions   Doxycycline     "headache, irritability, upset stomach"   Penicillins     REACTION: hives    ROS Review of Systems  Constitutional:  Negative for chills and fever.  Respiratory:  Negative for cough and shortness of breath.   Gastrointestinal:  Positive for abdominal pain. Negative for blood in stool, diarrhea and vomiting.  Neurological:  Positive for headaches. Negative for seizures, syncope, speech difficulty and weakness.       See HPI     Objective:    Physical Exam Constitutional:      Appearance: She is well-developed.  Eyes:     Pupils: Pupils are equal, round, and reactive to light.  Neck:     Thyroid: No thyromegaly.     Vascular: No JVD.  Cardiovascular:     Rate and Rhythm: Normal rate and regular rhythm.     Heart sounds:    No gallop.  Pulmonary:     Effort: Pulmonary effort is normal. No respiratory distress.     Breath sounds: Normal breath sounds. No wheezing or rales.  Musculoskeletal:     Cervical back: Neck supple.  Neurological:     General: No focal deficit present.     Mental Status: She is alert.     Cranial Nerves: No cranial nerve deficit.    BP 130/80 (BP Location: Left Arm, Patient Position: Sitting, Cuff Size: Normal)    Pulse 89    Temp 97.8 F (36.6 C) (Oral)    Ht '5\' 3"'$  (1.6 m)    Wt 198 lb (89.8 kg)    LMP 12/26/2012    SpO2 96%    BMI 35.07 kg/m  Wt Readings from Last 3 Encounters:  03/08/21 198 lb (89.8 kg)  01/11/21 199 lb 12.8 oz (90.6 kg)  07/27/20 194 lb 11.2 oz (88.3 kg)     Health Maintenance Due  Topic Date Due   HIV Screening  Never done   Zoster Vaccines- Shingrix (1 of 2) Never done   COVID-19 Vaccine (3 - Booster for Pfizer series) 05/16/2019   INFLUENZA VACCINE  08/01/2020    There are no preventive care reminders to display for this  patient.  Lab Results  Component Value Date   TSH 4.41 07/27/2020   Lab Results  Component Value Date   WBC 5.7 07/27/2020   HGB 14.7 07/27/2020   HCT 43.8 07/27/2020   MCV 86.9  07/27/2020   PLT 252.0 07/27/2020   Lab Results  Component Value Date   NA 138 07/27/2020   K 4.4 07/27/2020   CO2 28 07/27/2020   GLUCOSE 85 07/27/2020   BUN 11 07/27/2020   CREATININE 0.71 07/27/2020   BILITOT 0.6 07/27/2020   ALKPHOS 104 07/27/2020   AST 21 07/27/2020   ALT 32 07/27/2020   PROT 7.3 07/27/2020   ALBUMIN 4.6 07/27/2020   CALCIUM 9.9 07/27/2020   GFR 95.53 07/27/2020   Lab Results  Component Value Date   CHOL 211 (H) 07/27/2020   Lab Results  Component Value Date   HDL 49.00 07/27/2020   Lab Results  Component Value Date   LDLCALC 139 (H) 07/27/2020   Lab Results  Component Value Date   TRIG 111.0 07/27/2020   Lab Results  Component Value Date   CHOLHDL 4 07/27/2020   No results found for: HGBA1C    Assessment & Plan:   Problem List Items Addressed This Visit   None Visit Diagnoses     Migraine without aura and without status migrainosus, not intractable    -  Primary   Relevant Medications   SUMAtriptan (IMITREX) 100 MG tablet   Abdominal pain, RUQ       Relevant Orders   Ambulatory referral to Gastroenterology     Patient relates frequent headaches which sound like migraines predominantly.  We discussed potential triggers and she was given handout of things to look out for.  We discussed consideration for prophylaxis given the frequency.  We discussed acute treatments and decided to try Imitrex 100 mg at onset of migraine and may repeat 1 in 2 hours as needed but no more than 2 in 24 hours.  She will get back with [place] if she is not seeing improvement with that.  -Besides looking for triggers we did discuss possible prophylactic medications which she will consider -We also discussed importance of trying to get more regular sleep-which could be a  significant trigger for her.  -Regarding her recent intermittent right upper quadrant pain she requests referral back to GI.  We explained that she probably ultimately need to see surgeon if she is having classic gallbladder symptoms with known impaired EF in the past.  She would like to see GI first and referral will be placed  Meds ordered this encounter  Medications   SUMAtriptan (IMITREX) 100 MG tablet    Sig: Take one tablet at onset of migraine and may repeat one in 2 hours as needed (max 2 in 24 hours)    Dispense:  10 tablet    Refill:  2    Follow-up: No follow-ups on file.    Carolann Littler, MD

## 2021-03-10 ENCOUNTER — Ambulatory Visit: Payer: BC Managed Care – PPO | Admitting: Family Medicine

## 2021-03-22 ENCOUNTER — Other Ambulatory Visit: Payer: Self-pay

## 2021-03-22 ENCOUNTER — Ambulatory Visit: Payer: BC Managed Care – PPO | Admitting: Family Medicine

## 2021-03-22 VITALS — BP 132/90 | HR 91 | Ht 63.0 in | Wt 195.0 lb

## 2021-03-22 DIAGNOSIS — M25561 Pain in right knee: Secondary | ICD-10-CM | POA: Diagnosis not present

## 2021-03-22 DIAGNOSIS — G8929 Other chronic pain: Secondary | ICD-10-CM | POA: Diagnosis not present

## 2021-03-22 DIAGNOSIS — M25562 Pain in left knee: Secondary | ICD-10-CM

## 2021-03-22 DIAGNOSIS — M76822 Posterior tibial tendinitis, left leg: Secondary | ICD-10-CM

## 2021-03-22 NOTE — Progress Notes (Signed)
? ?  Natasha Fitzgerald, am serving as a Education administrator for Dr. [name]. ? ?Natasha Fitzgerald is a 56 y.o. female who presents to [place] today for f/u L foot/ankle pain thought to be due to posterior tibialis tendinitis. Pt has switched job and is not working at [employer] and is constantly on her feet. Pt was last seen by Dr. Georgina Snell on 01/11/21 and was given a L posterior tib tendon steroid injection and was advised to immobilize w/ a CAM walker boot, resume HEP, and was referred to PT, completing 5 visits. Today, pt reports that she is very happy with her progress she is in the least amount of pain in the last twi years. PT is helping a lot. Patient is wanting to add her bilateral knee pain on to her PT order. Patient states that she has had issues with her knees for years where at night laying down they will lock up.  ? ?Dx imaging: 07/02/19 L foot XR ? ?Pertinent review of systems: No fevers or chills ? ?Relevant historical information: Hypertension ? ? ?Exam:  ?BP 132/90   Pulse 91   Ht '5\' 3"'$  (1.6 m)   Wt 195 lb (88.5 kg)   LMP 12/26/2012   SpO2 97%   BMI 34.54 kg/m?  ?General: Well Developed, well nourished, and in no acute distress.  ? ?MSK: Left ankle: Normal. ?Nontender along medial ankle. ?Normal ankle motion and strength. ? ?Right knee: Normal.  Normal motion with crepitation. ?Left knee normal.  Normal motion with crepitation. ? ? ? ?Assessment and Plan: ?56 y.o. female with left ankle pain and knee pain.   ?Ankle pain due to posterior tibialis tendinitis.  Improved with injection at the posterior tibialis tendon sheath and continued physical therapy ? ?She is currently receiving physical therapy for the knee pain.  [facility] needs a formal order to authorize them to work on the knee pain.  We will go ahead and clarify that with a order today. ? ?Otherwise check back with me as needed.  Continue physical therapy until and actually runs  out. ? ? ? ? ?Discussed warning signs or symptoms. Please see discharge instructions. Patient expresses understanding. ? ? ?The above documentation has been reviewed and is accurate and complete [name], M.D. ? ? ?

## 2021-03-22 NOTE — Patient Instructions (Addendum)
Good to see you  Follow up as needed  

## 2021-04-03 ENCOUNTER — Encounter: Payer: Self-pay | Admitting: Physical Therapy

## 2021-04-03 ENCOUNTER — Ambulatory Visit: Payer: BC Managed Care – PPO | Admitting: Physical Therapy

## 2021-04-03 DIAGNOSIS — M25572 Pain in left ankle and joints of left foot: Secondary | ICD-10-CM | POA: Diagnosis not present

## 2021-04-03 DIAGNOSIS — M6281 Muscle weakness (generalized): Secondary | ICD-10-CM | POA: Diagnosis not present

## 2021-04-03 DIAGNOSIS — R262 Difficulty in walking, not elsewhere classified: Secondary | ICD-10-CM | POA: Diagnosis not present

## 2021-04-03 DIAGNOSIS — M25561 Pain in right knee: Secondary | ICD-10-CM | POA: Diagnosis not present

## 2021-04-03 DIAGNOSIS — M25562 Pain in left knee: Secondary | ICD-10-CM

## 2021-04-03 DIAGNOSIS — G8929 Other chronic pain: Secondary | ICD-10-CM

## 2021-04-03 NOTE — Therapy (Signed)
?OUTPATIENT PHYSICAL THERAPY TREATMENT NOTE ? ? ?Patient Name: Natasha Fitzgerald ?MRN: [id number] ?DOB:[birth year], 56 y.o., female ?Today's Date: 04/03/2021 ? ?PCP: Eulas Post, MD ?REFERRING PROVIDER: Eulas Post, MD ? ? PT End of Session - 04/03/21 1622   ? ? Visit Number 6   ? Number of Visits 15   ? Date for PT Re-Evaluation 05/29/21   ? PT Start Time 9675   ? PT Stop Time 9163   ? PT Time Calculation (min) 38 min   ? Activity Tolerance Patient tolerated treatment well   ? Behavior During Therapy [facility] for tasks assessed/performed   ? ?  ?  ? ?  ? ? ? ?Past Medical History:  ?Diagnosis Date  ? Anxiety   ? Chronic headaches 09/28/2009  ? DEPRESSION 09/28/2009  ? Fibromyalgia   ? History of kidney stones   ? HYPERTENSION 09/28/2009  ? IRREGULAR MENSES 09/28/2009  ? RUQ pain   ? Thyroid disease   ? ?Past Surgical History:  ?Procedure Laterality Date  ? [place]  ? TONSILLECTOMY  1973  ? ?Patient Active Problem List  ? Diagnosis Date Noted  ? Fibromyalgia 05/06/2020  ? GERD (gastroesophageal reflux disease) 03/27/2019  ? Depression, recurrent ([place]) 11/25/2018  ? Onychomycosis 07/06/2013  ? Obesity (BMI 30-39.9) 07/06/2013  ? Osteoarthritis, multiple sites 04/21/2012  ? Hypothyroid 03/07/2011  ? Biliary dyskinesia 07/04/2010  ? Essential hypertension 09/28/2009  ? IRREGULAR MENSES 09/28/2009  ? Headache 09/28/2009  ? ?PCP: Eulas Post, MD ?  ?REFERRING PROVIDER: Gregor Hams, MD ?  ?REFERRING DIAG: W46.659 (ICD-10-CM) - Posterior tibial tendinitis of left lower extremity ?  ?THERAPY DIAG:  ?Pain in left ankle and joints of left foot ?  ?Muscle weakness (generalized) ?  ?Difficulty in walking, not elsewhere classified ? ?Bilat knee pain ?  ?ONSET DATE: Over a year ago ?  ?SUBJECTIVE:  ?  ?SUBJECTIVE STATEMENT: ?She relays she had MD follow up and now has referral for bilat knee pain as well. She has complaints of general fatigue today. ? ?PERTINENT HISTORY: ?HTN, fibromyalgia, OA,  Obesity, Depression  ?  ?PAIN:  ?Are you having pain? Yes ?NPRS scale: 2/10 at rest ?Pain location: left ankle/ foot and bilat knees ?PAIN TYPE: aching, sharp, and tight ?Aggravating factors: Standing at work all day, Walking for long periods of time  ?Relieving factors: Over- the- counter medication, rest ?  ?PRECAUTIONS: None ?  ?WEIGHT BEARING RESTRICTIONS No ?  ?PATIENT GOALS: Feel stronger, decrease pain, become more active ?  ?  ?OBJECTIVE:  ? ?  ?LE AROM/PROM: ?  ?A/PROM Right ?02/01/2021 Left ?02/01/2021 Left ?02/20/21 Right ?04/03/21 Left ?04/03/21  ?Ankle dorsiflexion WNL 10 12    ?Ankle plantarflexion WNL 40 45    ?Ankle inversion WNL 25 WNL    ?Ankle eversion WNL 10 15 ?    ?knee flexion    120 125  ?knee extension    0 0  ? (Blank rows = not tested) ?  ?LE MMT: ?  ?MMT Right ?02/01/2021 Left ?02/01/2021 Left ?02/20/21 Left ?04/03/21 Right ?04/03/21  ?Ankle dorsiflexion 5 4+ 5    ?Ankle plantarflexion 5 4- 4-    ?Ankle inversion 5 4 4+    ?Ankle eversion 5 4- 4    ?Knee flexion    5 4+  ?Knee extension    4+ 4-  ? (Blank rows = not tested) ? ?  ?FUNCTIONAL TESTS:  ?Single Leg Balance: w/o UE support L  leg 2 seconds on 02/01/21 and 7 seconds on 02/20/21  ?Tandem Leg Balance: w/o UE support L leg forward 6 seconds on 02/01/21 and 20 seconds on 02/20/21 ? ?Today's treatment  ?04/03/21 ? ?Therapeutic Exercise: ?Aerobic: Nu step seat 7 L6 X 8 min UE/LE ?Supine:heelslides AAROM 5 sec X10 ?Prone: ?Seated: LAQ 3X10 with 3# bilat. Hamstring curls green 2X10 bilat. ?Standing: Bilat gastroc and soleus stretch 30 sec X 2 ea on slantboard. Heel raises off of step 2X10. Step ups on 6 inch step X10 bilat with one UE support fwd.  ?Machines:DL leg press 75# 2X10, SL leg press 50# 2X10 ea side ?Neuromuscular Re-education: ?Manual Therapy: ?Therapeutic Activity: ?Modalities:  ? ? ?03/08/21 ?Therapeutic Exercise: ? Aerobic: Nustep lvl 6, Seat 6, 8 minutes UE/LE ?Seated: ankle 4 way with green X 15 ea, LAQ 5# 3X10 bilat  ?Standing: Gastroc stretch and  soleus stretch 3X30 sec ea bilat on slantboard.  Walking up ramp 2 times fwd, lateral ea way. Standing H.S curs X 15 bilat with 5#, Standing hip abd with 5# X 10 bilat. Mini squats at counter X 10 ?Neuromuscular Re-education:Heel/toe raises X 15 bilat without UE support on airex. Tandem balance 30 sec X 2 bilat on airex pad. tandem walk and [place] walk at counter top 2 round trips without UE support. ?Manual Therapy: ?Therapeutic Activity: ?Self Care: ?Trigger Point Dry Needling:  ?Modalities:  ? ? ?PATIENT EDUCATION: 03/06/21 ?Education details: HEP progression and revision ?Person educated: Patient ?Education method: Explanation, Demonstration, Tactile cues, and Verbal cues ?Education comprehension: verbalized understanding, returned demonstration,  ?  ?HOME EXERCISE PROGRAM: ?Access Code: [email or web address] ?URL: [email or web address] ?Date: 03/06/2021 ?Prepared by: [name] ? ?Exercises ?Isometric Ankle Inversion at Wall - 2 x daily - 6 x weekly - 2 sets - 10 reps - 20-30sec. hold ?Gastroc Stretch on Wall - 1 x daily - 3 x weekly - 1 sets - 3 reps - 30 hold ?Arch Lifting - 2 x daily - 6 x weekly - 2 sets - 10 reps - 5 sec. hold ?Heel Raises with Counter Support - 2 x daily - 6 x weekly - 2 sets - 10 reps ?Heel Toe Raises with Counter Support - 2 x daily - 6 x weekly - 2 sets - 10 reps ?Tandem Walking with Counter Support - 2 x daily - 6 x weekly - 3 sets ?Braided Sidestepping - 2 x daily - 6 x weekly - 10 reps - 1 sets ?Mini Squat with Counter Support - 2 x daily - 6 x weekly - 1-2 sets - 10 reps ?Supine Heel Slide with Strap - 2 x daily - 6 x weekly - 10 reps - 1-2 sets - 5 hold ?Supine Hamstring Stretch with Strap - 2 x daily - 6 x weekly - 1 sets - 2 reps - 30 hold ? ?  ?  ?ASSESSMENT: ?  ?CLINICAL IMPRESSION: ?Recert was performed to add bilat knee pain into her PT plan of care and she has referral for this. She has some deceased ROM and strength in her Rt knee compared to her left knee. We will  work on this in PT and focus more on her knee pain for now as her ankle is doing well. ?  ?REHAB POTENTIAL: Good ?  ?CLINICAL DECISION MAKING: stable/uncomplicated ?  ?EVALUATION COMPLEXITY: Low ?  ?  ?GOALS: ?Short term PT Goals (target date for Short term goals are 4 weeks 03/01/21) ?Pt will be I and compliant with HEP. ?Baseline:  ?Goal status:  MET ?Pt will decrease pain by 25% overall ?Baseline: ?Goal status: MET ?  ?Long term PT goals (target dates for all long term goals are 8 weeks 04/03/21) ?Pt will improve Lt ankle ROM to [facility] to improve functional mobility ?Baseline: ?Goal status: ongoing ?Pt will improve  Lt ankle strength to at least 5-/5 MMT in supine to improve functional strength ?Baseline: ?Goal status: ongoing ?Pt will improve FOTO to at least 52% functional to show improved function ?Baseline: ?Goal status: ongoing ?Pt will reduce pain by overall 50% overall with usual activity ?Baseline: ?Goal status: ongoing ?Pt will be able to ambulate community distances at least 1000 ft WNL gait pattern with minimal complaints ?Baseline: ?Goal status: ongoing ?  ?PLAN: ?PT FREQUENCY: 1-2 times per week  ?  ?PT DURATION: 6-8 weeks ?  ?PLANNED INTERVENTIONS (unless contraindicated): aquatic PT, Canalith repositioning, cryotherapy, Electrical stimulation, Iontophoresis with 4 mg/ml dexamethasome, Moist heat, traction, Ultrasound, gait training, Therapeutic exercise, balance training, neuromuscular re-education, patient/family education, prosthetic training, manual techniques, passive ROM, dry needling, taping, vasopnuematic device, vestibular, spinal manipulations, joint manipulations ?  ?PLAN FOR NEXT SESSION: continue to work left ankle and bilat knee exercises as tolerated. ? ?[name], PT, DPT ?04/03/21 4:24 PM ? ? ? ?

## 2021-04-05 ENCOUNTER — Encounter: Payer: BC Managed Care – PPO | Admitting: Physical Therapy

## 2021-04-19 ENCOUNTER — Ambulatory Visit: Payer: BC Managed Care – PPO | Admitting: Physical Therapy

## 2021-04-19 ENCOUNTER — Encounter: Payer: Self-pay | Admitting: Physical Therapy

## 2021-04-19 DIAGNOSIS — M25572 Pain in left ankle and joints of left foot: Secondary | ICD-10-CM

## 2021-04-19 DIAGNOSIS — M25561 Pain in right knee: Secondary | ICD-10-CM

## 2021-04-19 DIAGNOSIS — M6281 Muscle weakness (generalized): Secondary | ICD-10-CM

## 2021-04-19 DIAGNOSIS — R262 Difficulty in walking, not elsewhere classified: Secondary | ICD-10-CM

## 2021-04-19 DIAGNOSIS — M25562 Pain in left knee: Secondary | ICD-10-CM

## 2021-04-19 DIAGNOSIS — G8929 Other chronic pain: Secondary | ICD-10-CM

## 2021-04-19 NOTE — Therapy (Addendum)
OUTPATIENT PHYSICAL THERAPY TREATMENT NOTE/Discharge PHYSICAL THERAPY DISCHARGE SUMMARY  Visits from Start of Care: 7  Current functional level related to goals / functional outcomes: See below   Remaining deficits: See below   Education / Equipment: HEP  Plan:  Patient goals were not met. Patient is being discharged due to not returning since last visit. Natasha Fitzgerald, PT, DPT 07/05/21 10:13 AM       Patient Name: Natasha Fitzgerald MRN: [id number] DOB:September 23, 1965, 56 y.o., female Today's Date: 04/19/2021  PCP: Natasha Post, MD REFERRING PROVIDER: Gregor Hams, MD   PT End of Session - 04/19/21 1609     Visit Number 7    Number of Visits 15    Date for PT Re-Evaluation 05/29/21    PT Start Time 1517    PT Stop Time 1644    PT Time Calculation (min) 40 min    Activity Tolerance Patient tolerated treatment well    Behavior During Therapy [facility] for tasks assessed/performed              Past Medical History:  Diagnosis Date   Anxiety    Chronic headaches 09/28/2009   DEPRESSION 09/28/2009   Fibromyalgia    History of kidney stones    HYPERTENSION 09/28/2009   IRREGULAR MENSES 09/28/2009   RUQ pain    Thyroid disease    Past Surgical History:  Procedure Laterality Date   [place]   Patient Active Problem List   Diagnosis Date Noted   Fibromyalgia 05/06/2020   GERD (gastroesophageal reflux disease) 03/27/2019   Depression, recurrent ([place]) 11/25/2018   Onychomycosis 07/06/2013   Obesity (BMI 30-39.9) 07/06/2013   Osteoarthritis, multiple sites 04/21/2012   Hypothyroid 03/07/2011   Biliary dyskinesia 07/04/2010   Essential hypertension 09/28/2009   IRREGULAR MENSES 09/28/2009   Headache 09/28/2009   PCP: Natasha Post, MD   REFERRING PROVIDER: Gregor Hams, MD   REFERRING DIAG: [phone] (ICD-10-CM) - Posterior tibial tendinitis of left lower extremity   THERAPY DIAG:  Pain in left ankle and joints  of left foot   Muscle weakness (generalized)   Difficulty in walking, not elsewhere classified  Bilat knee pain   ONSET DATE: Over a year ago   SUBJECTIVE:    SUBJECTIVE STATEMENT: She relays she had MD follow up and now has referral for bilat knee pain as well. She has complaints of general fatigue today.  PERTINENT HISTORY: HTN, fibromyalgia, OA, Obesity, Depression    PAIN:  Are you having pain? Yes NPRS scale: 2/10 at rest Pain location: left ankle/ foot and bilat knees PAIN TYPE: aching, sharp, and tight Aggravating factors: Standing at work all day, Walking for long periods of time  Relieving factors: Over- the- counter medication, rest   PRECAUTIONS: None   WEIGHT BEARING RESTRICTIONS No   PATIENT GOALS: Feel stronger, decrease pain, become more active     OBJECTIVE:     LE AROM/PROM:   A/PROM Right 02/01/2021 Left 02/01/2021 Left 02/20/21 Right 04/03/21 Left 04/03/21  Ankle dorsiflexion WNL 10 12    Ankle plantarflexion WNL 40 45    Ankle inversion WNL 25 WNL    Ankle eversion WNL 10 15     knee flexion    120 125  knee extension    0 0   (Blank rows = not tested)   LE MMT:   MMT Right 02/01/2021 Left 02/01/2021 Left 02/20/21 Left 04/03/21 Right 04/03/21  Ankle dorsiflexion  5 4+ 5    Ankle plantarflexion 5 4- 4-    Ankle inversion 5 4 4+    Ankle eversion 5 4- 4    Knee flexion    5 4+  Knee extension    4+ 4-   (Blank rows = not tested)    FUNCTIONAL TESTS:  Single Leg Balance: w/o UE support L leg 2 seconds on 02/01/21 and 7 seconds on 02/20/21  Tandem Leg Balance: w/o UE support L leg forward 6 seconds on 02/01/21 and 20 seconds on 02/20/21  Today's treatment  04/19/21 Therapeutic Exercise: Aerobic: Nu step seat 7 L6 X 6 min UE/LE Supine:heelslides AAROM 5 sec X10 bilat Prone: Seated: Standing: Bilat gastroc and soleus stretch 30 sec X 2 ea on slantboard. Heel and toe raises X15 .Step ups and downs on 6 inch step X10 bilat with one UE support fwd.   TRX mini squats 2X10 Machines:DL leg press 75# 2X15, SL leg press 50# 2X10 ea side. Leg extension machine 5# 2X10 DL. Hamstring curl 25# 2X10 DL Neuromuscular Re-education: Manual Therapy: Therapeutic Activity: Modalities:   04/03/21  Therapeutic Exercise: Aerobic: Nu step seat 7 L6 X 8 min UE/LE Supine:heelslides AAROM 5 sec X10 Prone: Seated: LAQ 3X10 with 3# bilat. Hamstring curls green 2X10 bilat. Standing: Bilat gastroc and soleus stretch 30 sec X 2 ea on slantboard. Heel raises off of step 2X10. Step ups on 6 inch step X10 bilat with one UE support fwd.  Machines:DL leg press 75# 2X10, SL leg press 50# 2X10 ea side Neuromuscular Re-education: Manual Therapy: Therapeutic Activity: Modalities:    PATIENT EDUCATION: 03/06/21 Education details: HEP progression and revision Person educated: Patient Education method: Explanation, Demonstration, Tactile cues, and Verbal cues Education comprehension: verbalized understanding, returned demonstration,    HOME EXERCISE PROGRAM: Access Code: [email or web address] URL: [email or web address] Date: 03/06/2021 Prepared by: Natasha Fitzgerald  Exercises Isometric Ankle Inversion at Wall - 2 x daily - 6 x weekly - 2 sets - 10 reps - 20-30sec. hold Gastroc Stretch on Wall - 1 x daily - 3 x weekly - 1 sets - 3 reps - 30 hold Arch Lifting - 2 x daily - 6 x weekly - 2 sets - 10 reps - 5 sec. hold Heel Raises with Counter Support - 2 x daily - 6 x weekly - 2 sets - 10 reps Heel Toe Raises with Counter Support - 2 x daily - 6 x weekly - 2 sets - 10 reps Tandem Walking with Counter Support - 2 x daily - 6 x weekly - 3 sets Braided Sidestepping - 2 x daily - 6 x weekly - 10 reps - 1 sets Mini Squat with Counter Support - 2 x daily - 6 x weekly - 1-2 sets - 10 reps Supine Heel Slide with Strap - 2 x daily - 6 x weekly - 10 reps - 1-2 sets - 5 hold Supine Hamstring Stretch with Strap - 2 x daily - 6 x weekly - 1 sets - 2 reps - 30 hold       ASSESSMENT:   CLINICAL IMPRESSION: She had some less overall knee pain today so progressed her strength program slightly with good overall tolerance. We will monitor any soreness to this activity progression next time and she was not too sore or in much pain we will attempt to progress as tolerated to build her functional strength.   REHAB POTENTIAL: Good   CLINICAL DECISION MAKING: stable/uncomplicated   EVALUATION COMPLEXITY: Low  GOALS: Short term PT Goals (target date for Short term goals are 4 weeks 03/01/21) Pt will be I and compliant with HEP. Baseline:  Goal status: MET Pt will decrease pain by 25% overall Baseline: Goal status: MET   Long term PT goals (target dates for all long term goals are 8 weeks 04/03/21) Pt will improve Lt ankle ROM to [facility] to improve functional mobility Baseline: Goal status: ongoing Pt will improve  Lt ankle strength to at least 5-/5 MMT in supine to improve functional strength Baseline: Goal status: ongoing Pt will improve FOTO to at least 52% functional to show improved function Baseline: Goal status: ongoing Pt will reduce pain by overall 50% overall with usual activity Baseline: Goal status: ongoing Pt will be able to ambulate community distances at least 1000 ft WNL gait pattern with minimal complaints Baseline: Goal status: ongoing   PLAN: PT FREQUENCY: 1-2 times per week    PT DURATION: 6-8 weeks   PLANNED INTERVENTIONS (unless contraindicated): aquatic PT, Canalith repositioning, cryotherapy, Electrical stimulation, Iontophoresis with 4 mg/ml dexamethasome, Moist heat, traction, Ultrasound, gait training, Therapeutic exercise, balance training, neuromuscular re-education, patient/family education, prosthetic training, manual techniques, passive ROM, dry needling, taping, vasopnuematic device, vestibular, spinal manipulations, joint manipulations   PLAN FOR NEXT SESSION: continue to work left ankle and bilat knee exercises as  tolerated.  Natasha Fitzgerald, PT, DPT 04/19/21 4:41 PM

## 2021-04-24 ENCOUNTER — Encounter: Payer: BC Managed Care – PPO | Admitting: Physical Therapy

## 2021-05-03 ENCOUNTER — Encounter: Payer: Self-pay | Admitting: Family Medicine

## 2021-05-03 ENCOUNTER — Ambulatory Visit: Payer: BC Managed Care – PPO | Admitting: Family Medicine

## 2021-05-03 VITALS — BP 124/82 | HR 86 | Temp 97.4°F | Ht 63.0 in | Wt 197.6 lb

## 2021-05-03 DIAGNOSIS — R300 Dysuria: Secondary | ICD-10-CM | POA: Diagnosis not present

## 2021-05-03 LAB — POC URINALSYSI DIPSTICK (AUTOMATED)
Bilirubin, UA: NEGATIVE
Glucose, UA: NEGATIVE
Ketones, UA: NEGATIVE
Leukocytes, UA: NEGATIVE
Nitrite, UA: NEGATIVE
Protein, UA: NEGATIVE
Spec Grav, UA: >=1.03 — AB (ref 1.010–1.025)
Urobilinogen, UA: 0.2 E.U./dL
pH, UA: 6 (ref 5.0–8.0)

## 2021-05-03 MED ORDER — ALBUTEROL SULFATE HFA 108 (90 BASE) MCG/ACT IN AERS: 1.0000 | INHALATION_SPRAY | Freq: Four times a day (QID) | RESPIRATORY_TRACT | 0 refills | Status: DC | PRN

## 2021-05-03 NOTE — Patient Instructions (Signed)
We are sending urine for culture and microscopy and will be calling after those are back.  ?

## 2021-05-03 NOTE — Progress Notes (Signed)
? ?Established Patient Office Visit ? ?Subjective   ?Patient ID: Natasha Fitzgerald, female    DOB: [birth year]  Age: 56 y.o. MRN: [id number] ? ?Chief Complaint  ?Patient presents with  ? Urinary Tract Infection  ? Abdominal Pain  ? Urinary Frequency  ? ? ?HPI ? ? ?Seen for concern for possible UTI.  She apparently had several weeks of some urine frequency, urgency, and mild discomfort.  No gross hematuria.  No fevers or chills.  No nausea or vomiting.  No flank pain.  No recent UTI. ? ?Past Medical History:  ?Diagnosis Date  ? Anxiety   ? Chronic headaches 09/28/2009  ? DEPRESSION 09/28/2009  ? Fibromyalgia   ? History of kidney stones   ? HYPERTENSION 09/28/2009  ? IRREGULAR MENSES 09/28/2009  ? RUQ pain   ? Thyroid disease   ? ?Past Surgical History:  ?Procedure Laterality Date  ? [place]  ? TONSILLECTOMY  1973  ? ? reports that she has never smoked. She has never used smokeless tobacco. She reports current alcohol use. She reports that she does not use drugs. ?family history includes Breast cancer in her maternal grandmother; Cancer in her paternal grandmother; Hashimoto's thyroiditis in her sister; Heart disease in her maternal grandfather and paternal grandfather; Hypertension in her mother. ?Allergies  ?Allergen Reactions  ? Doxycycline   ?  "headache, irritability, upset stomach"  ? Penicillins   ?  REACTION: hives  ? ? ?Review of Systems  ?Constitutional:  Negative for chills and fever.  ?Genitourinary:  Positive for dysuria, frequency and urgency. Negative for flank pain and hematuria.  ? ?  ?Objective:  ?  ? ?BP 124/82 (BP Location: Left Arm, Cuff Size: Normal)   Pulse 86   Temp (!) 97.4 ?F (36.3 ?C) (Oral)   Ht '5\' 3"'$  (1.6 m)   Wt 197 lb 9.6 oz (89.6 kg)   LMP 12/26/2012   SpO2 97%   BMI 35.00 kg/m?  ? ? ?Physical Exam ?Vitals reviewed.  ?Cardiovascular:  ?   Rate and Rhythm: Normal rate and regular rhythm.  ?Pulmonary:  ?   Effort: Pulmonary effort is normal.  ?   Breath sounds: Normal  breath sounds.  ? ? ? ?Results for orders placed or performed in visit on 05/03/21  ?POCT Urinalysis Dipstick (Automated)  ?Result Value Ref Range  ? Color, UA yellow   ? Clarity, UA cloudy   ? Glucose, UA Negative Negative  ? Bilirubin, UA negative   ? Ketones, UA negative   ? Spec Grav, UA >=1.030 (A) 1.010 - 1.025  ? Blood, UA 2+   ? pH, UA 6.0 5.0 - 8.0  ? Protein, UA Negative Negative  ? Urobilinogen, UA 0.2 0.2 or 1.0 E.U./dL  ? Nitrite, UA negative   ? Leukocytes, UA Negative Negative  ? ? ? ? ?The 10-year ASCVD risk score (Arnett DK, et al., 2019) is: 3.2% ? ?  ?Assessment & Plan:  ? ?Problem List Items Addressed This Visit   ?None ?Visit Diagnoses   ? ? Dysuria    -  Primary  ? Relevant Orders  ? POCT Urinalysis Dipstick (Automated) (Completed)  ? ?  ?Check urine dipstick= shows 2+ blood otherwise negative. ? ?-Urine culture sent along with urine microscopy ?-If culture negative and microscopy confirms greater than 3 red blood cells per high-power field will need further evaluation. ?-We decided to hold on antibiotics at this time pending culture results-since there were no leukocytes or nitrites and  symptoms somewhat atypical ? ?No follow-ups on file.  ? ? ?Carolann Littler, MD ? ?

## 2021-05-04 ENCOUNTER — Telehealth: Payer: Self-pay | Admitting: Family Medicine

## 2021-05-04 LAB — URINALYSIS, MICROSCOPIC ONLY

## 2021-05-04 LAB — URINE CULTURE
MICRO NUMBER:: 13346170
SPECIMEN QUALITY:: ADEQUATE

## 2021-05-04 NOTE — Telephone Encounter (Signed)
Pt is calling back and would like urine result ?

## 2021-05-04 NOTE — Telephone Encounter (Addendum)
Spoke with patient about urine results.   Pt aware waiting on culture result.  ?

## 2021-05-08 ENCOUNTER — Other Ambulatory Visit: Payer: Self-pay

## 2021-05-08 DIAGNOSIS — N132 Hydronephrosis with renal and ureteral calculous obstruction: Secondary | ICD-10-CM | POA: Diagnosis not present

## 2021-05-08 DIAGNOSIS — R1031 Right lower quadrant pain: Secondary | ICD-10-CM | POA: Diagnosis present

## 2021-05-09 ENCOUNTER — Emergency Department (HOSPITAL_BASED_OUTPATIENT_CLINIC_OR_DEPARTMENT_OTHER)
Admission: EM | Admit: 2021-05-09 | Discharge: 2021-05-09 | Disposition: A | Discharge status: Home / Self Care | Payer: BC Managed Care – PPO | Attending: Emergency Medicine | Admitting: Emergency Medicine

## 2021-05-09 ENCOUNTER — Other Ambulatory Visit: Payer: Self-pay

## 2021-05-09 ENCOUNTER — Encounter (HOSPITAL_BASED_OUTPATIENT_CLINIC_OR_DEPARTMENT_OTHER): Payer: Self-pay | Admitting: Emergency Medicine

## 2021-05-09 ENCOUNTER — Other Ambulatory Visit (HOSPITAL_BASED_OUTPATIENT_CLINIC_OR_DEPARTMENT_OTHER): Payer: Self-pay

## 2021-05-09 ENCOUNTER — Emergency Department (HOSPITAL_BASED_OUTPATIENT_CLINIC_OR_DEPARTMENT_OTHER): Payer: BC Managed Care – PPO

## 2021-05-09 DIAGNOSIS — N201 Calculus of ureter: Secondary | ICD-10-CM

## 2021-05-09 LAB — COMPREHENSIVE METABOLIC PANEL
ALT: 19 U/L (ref 0–44)
AST: 15 U/L (ref 15–41)
Albumin: 4.9 g/dL (ref 3.5–5.0)
Alkaline Phosphatase: 85 U/L (ref 38–126)
Anion gap: 12 (ref 5–15)
BUN: 15 mg/dL (ref 6–20)
CO2: 25 mmol/L (ref 22–32)
Calcium: 9.7 mg/dL (ref 8.9–10.3)
Chloride: 102 mmol/L (ref 98–111)
Creatinine, Ser: 0.79 mg/dL (ref 0.44–1.00)
GFR, Estimated: >60 mL/min (ref >60)
Glucose, Bld: 105 mg/dL — ABNORMAL HIGH (ref 70–99)
Potassium: 3.9 mmol/L (ref 3.5–5.1)
Sodium: 139 mmol/L (ref 135–145)
Total Bilirubin: 0.5 mg/dL (ref 0.3–1.2)
Total Protein: 7.8 g/dL (ref 6.5–8.1)

## 2021-05-09 LAB — URINALYSIS, ROUTINE W REFLEX MICROSCOPIC
Bilirubin Urine: NEGATIVE
Glucose, UA: NEGATIVE mg/dL
Ketones, ur: NEGATIVE mg/dL
Leukocytes,Ua: NEGATIVE
Nitrite: NEGATIVE
Protein, ur: 30 mg/dL — AB
RBC / HPF: >50 RBC/hpf — ABNORMAL HIGH (ref 0–5)
Specific Gravity, Urine: 1.026 (ref 1.005–1.030)
pH: 6 (ref 5.0–8.0)

## 2021-05-09 LAB — CBC
HCT: 48.5 % — ABNORMAL HIGH (ref 36.0–46.0)
Hemoglobin: 16 g/dL — ABNORMAL HIGH (ref 12.0–15.0)
MCH: 28.7 pg (ref 26.0–34.0)
MCHC: 33 g/dL (ref 30.0–36.0)
MCV: 86.9 fL (ref 80.0–100.0)
Platelets: 301 10*3/uL (ref 150–400)
RBC: 5.58 MIL/uL — ABNORMAL HIGH (ref 3.87–5.11)
RDW: 13.4 % (ref 11.5–15.5)
WBC: 9 10*3/uL (ref 4.0–10.5)
nRBC: 0 % (ref 0.0–0.2)

## 2021-05-09 LAB — LIPASE, BLOOD: Lipase: 22 U/L (ref 11–51)

## 2021-05-09 MED ORDER — HYDROCODONE-ACETAMINOPHEN 5-325 MG PO TABS
1.0000 | ORAL_TABLET | Freq: Four times a day (QID) | ORAL | 0 refills | Status: DC | PRN
  Filled 2021-05-09: qty 12, 3d supply, fill #0

## 2021-05-09 MED ORDER — FENTANYL CITRATE PF 50 MCG/ML IJ SOSY
50.0000 ug | PREFILLED_SYRINGE | Freq: Once | INTRAMUSCULAR | Status: AC
  Administered 2021-05-09: 50 ug via INTRAVENOUS
  Filled 2021-05-09: qty 1

## 2021-05-09 MED ORDER — LACTATED RINGERS IV BOLUS
1000.0000 mL | Freq: Once | INTRAVENOUS | Status: AC
  Administered 2021-05-09: 1000 mL via INTRAVENOUS

## 2021-05-09 MED ORDER — TAMSULOSIN HCL 0.4 MG PO CAPS
0.4000 mg | ORAL_CAPSULE | Freq: Every day | ORAL | 0 refills | Status: AC
  Filled 2021-05-09: qty 15, 15d supply, fill #0

## 2021-05-09 MED ORDER — KETOROLAC TROMETHAMINE 30 MG/ML IJ SOLN
30.0000 mg | Freq: Once | INTRAMUSCULAR | Status: AC
  Administered 2021-05-09: 30 mg via INTRAVENOUS
  Filled 2021-05-09: qty 1

## 2021-05-09 MED ORDER — ONDANSETRON HCL 4 MG/2ML IJ SOLN
4.0000 mg | Freq: Once | INTRAMUSCULAR | Status: AC
  Administered 2021-05-09: 4 mg via INTRAVENOUS
  Filled 2021-05-09: qty 2

## 2021-05-09 MED ORDER — IBUPROFEN 600 MG PO TABS
600.0000 mg | ORAL_TABLET | Freq: Four times a day (QID) | ORAL | 0 refills | Status: DC | PRN
  Filled 2021-05-09: qty 30, 8d supply, fill #0

## 2021-05-09 NOTE — ED Triage Notes (Signed)
Pt c/o right sided abdominal pain with N/V that started this AM ?

## 2021-05-09 NOTE — ED Provider Notes (Signed)
? ?[facility]  ?Provider Note ? ?CSN: [id number] ?Arrival date & time: 05/08/21 2334 ? ?History ?Chief Complaint  ?Patient presents with  ? Abdominal Pain  ? ? ?Natasha Fitzgerald is a 56 y.o. female reports several days of right lower abdominal pain. Seen at PCP office on 5/3 where UA had some blood but culture was negative. Pain has been more persistent, also in R flank today. Pain is worse with movement, better with lying down. She has had nausea but no vomiting. She has prior history of kidney stones, but this feels different. No fevers.  ? ? ?Home Medications ?Prior to Admission medications   ?Medication Sig Start Date End Date Taking? Authorizing Provider  ?HYDROcodone-acetaminophen (NORCO/VICODIN) 5-325 MG tablet Take 1 tablet by mouth every 6 (six) hours as needed for severe pain. 05/09/21  Yes Truddie Hidden, MD  ?ibuprofen (ADVIL) 600 MG tablet Take 1 tablet (600 mg total) by mouth every 6 (six) hours as needed. 05/09/21  Yes Truddie Hidden, MD  ?tamsulosin (FLOMAX) 0.4 MG CAPS capsule Take 1 capsule (0.4 mg total) by mouth daily for 15 days. 05/09/21 05/24/21 Yes Truddie Hidden, MD  ?acetaminophen (TYLENOL) 500 MG tablet Take 500 mg by mouth every 6 (six) hours as needed.    [provider]  ?albuterol (VENTOLIN HFA) 108 (90 Base) MCG/ACT inhaler Inhale 1-2 puffs into the lungs every 6 (six) hours as needed for wheezing or shortness of breath. 05/03/21   Burchette, Alinda Sierras, MD  ?diclofenac Sodium (VOLTAREN) 1 % GEL Apply 2 g topically 4 (four) times daily. 04/29/19   Burchette, Alinda Sierras, MD  ?escitalopram (LEXAPRO) 10 MG tablet Take 1 tablet (10 mg total) by mouth daily. 02/27/21   Burchette, Alinda Sierras, MD  ?levothyroxine (SYNTHROID) 88 MCG tablet TAKE 1 TABLET BY MOUTH DAILY BEFORE BREAKFAST. 12/21/20   Burchette, Alinda Sierras, MD  ?losartan (COZAAR) 25 MG tablet Take 2 tablets (50 mg total) by mouth daily. 02/27/21   Burchette, Alinda Sierras, MD  ?SUMAtriptan (IMITREX) 100 MG  tablet Take one tablet at onset of migraine and may repeat one in 2 hours as needed (max 2 in 24 hours) 03/08/21   Burchette, Alinda Sierras, MD  ? ? ? ?Allergies    ?Doxycycline and Penicillins ? ? ?Review of Systems   ?Review of Systems ?Please see HPI for pertinent positives and negatives ? ?Physical Exam ?BP 128/75   Pulse 78   Temp 98 ?F (36.7 ?C)   Resp 18   LMP 12/26/2012   SpO2 96%  ? ?Physical Exam ?Vitals and nursing note reviewed.  ?Constitutional:   ?   Appearance: Normal appearance.  ?HENT:  ?   Head: Normocephalic and atraumatic.  ?   Nose: Nose normal.  ?   Mouth/Throat:  ?   Mouth: Mucous membranes are moist.  ?Eyes:  ?   Extraocular Movements: Extraocular movements intact.  ?   Conjunctiva/sclera: Conjunctivae normal.  ?Cardiovascular:  ?   Rate and Rhythm: Normal rate.  ?Pulmonary:  ?   Effort: Pulmonary effort is normal.  ?   Breath sounds: Normal breath sounds.  ?Abdominal:  ?   General: Abdomen is flat.  ?   Palpations: Abdomen is soft.  ?   Tenderness: There is abdominal tenderness in the right lower quadrant. There is no guarding. Negative signs include Murphy's sign and McBurney's sign.  ?Musculoskeletal:     ?   General: Tenderness (R lumbar paraspinal muscles) present. No swelling. Normal  range of motion.  ?   Cervical back: Neck supple.  ?Skin: ?   General: Skin is warm and dry.  ?Neurological:  ?   General: No focal deficit present.  ?   Mental Status: She is alert.  ?Psychiatric:     ?   Mood and Affect: Mood normal.  ? ? ?ED Results / Procedures / Treatments   ?EKG ?None ? ?Procedures ?Procedures ? ?Medications Ordered in the ED ?Medications  ?fentaNYL (SUBLIMAZE) injection 50 mcg (50 mcg Intravenous Given 05/09/21 0123)  ?ondansetron [facility]) injection 4 mg (4 mg Intravenous Given 05/09/21 0123)  ?lactated ringers bolus 1,000 mL (0 mLs Intravenous Stopped 05/09/21 0241)  ?ketorolac (TORADOL) 30 MG/ML injection 30 mg (30 mg Intravenous Given 05/09/21 0238)  ? ? ?Initial Impression and Plan ?  Patient with R flank and RLQ abdominal pain. She has hematuria again today but she is tender and states this is different from prior renal colic. Will send for CT to eval hematuria. CBC with normal WBC.  ? ?ED Course  ? ?Clinical Course as of 05/09/21 0304  ?Tue May 09, 2021  ?0222 I personally viewed the images from radiology studies and agree with radiologist interpretation: CT shows a small proximal ureteral stone on the right as the likely cause of her symptoms.  ? [CS]  ?3151 Discussed results with the patient. She is still having some pain, will give a dose of Toradol. Plan discharge with Rx for pain, nausea meds, flomax and Urology referral if she does not pass the stone in a few days.  [CS]  ?  ?Clinical Course User Index ?[CS] Truddie Hidden, MD  ? ? ? ?MDM Rules/Calculators/A&P ?Medical Decision Making ?Problems Addressed: ?Ureteral stone: acute illness or injury ? ?Amount and/or Complexity of Data Reviewed ?Labs: ordered. Decision-making details documented in ED Course. ?Radiology: ordered and independent interpretation performed. Decision-making details documented in ED Course. ? ?Risk ?OTC drugs. ?Prescription drug management. ?Parenteral controlled substances. ? ? ? ?Final Clinical Impression(s) / ED Diagnoses ?Final diagnoses:  ?Ureteral stone  ? ? ?Rx / DC Orders ?ED Discharge Orders   ? ?      Ordered  ?  HYDROcodone-acetaminophen (NORCO/VICODIN) 5-325 MG tablet  Every 6 hours PRN       ? 05/09/21 0303  ?  tamsulosin (FLOMAX) 0.4 MG CAPS capsule  Daily       ? 05/09/21 0303  ?  ibuprofen (ADVIL) 600 MG tablet  Every 6 hours PRN       ? 05/09/21 0303  ? ?  ?  ? ?  ? ?  ?Truddie Hidden, MD ?05/09/21 0304 ? ?

## 2021-05-17 ENCOUNTER — Encounter: Payer: Self-pay | Admitting: Gastroenterology

## 2021-05-17 ENCOUNTER — Ambulatory Visit: Payer: BC Managed Care – PPO | Admitting: Gastroenterology

## 2021-05-17 DIAGNOSIS — R1010 Upper abdominal pain, unspecified: Secondary | ICD-10-CM | POA: Diagnosis not present

## 2021-05-17 DIAGNOSIS — R1011 Right upper quadrant pain: Secondary | ICD-10-CM | POA: Diagnosis not present

## 2021-05-17 DIAGNOSIS — R1013 Epigastric pain: Secondary | ICD-10-CM

## 2021-05-17 NOTE — Progress Notes (Signed)
Review of pertinent gastrointestinal problems: 1.  Precancerous colon polyps.  Colonoscopy 09/2019 Dr. Ardis Hughs found 2 subcentimeter adenomas.  Recall recommended at 7-year interval 2.  Right upper quadrant pain 2012 evaluation ultrasound showed no stones, normal gallbladder, slightly elevated LFTs.  HIDA scan showed gallbladder ejection fraction in the 20% range.  I recommended that she sit down with a surgeon to consider elective lap cholecystectomy.  She never had her gallbladder removed however.  She actually never went to see a Psychologist, sport and exercise.    HPI: This is a very pleasant 56 year old woman who was referred to me by Eulas Post, MD  to evaluate chronic upper abdominal pains.    I last saw her about a year and a half ago for colon cancer screening colonoscopy.  See that results above.  She is here today for a new problem.  She was found to have a right sided ureteral stone causing mild right-sided hydronephrosis and blood in her urine.  Stone was confirmed on 05/09/2021 CT "renal stone study".  Blood work 05/6385 complete metabolic profile was normal, lipase was normal, CBC showed hemoglobin 16.0, otherwise normal.  Urine dipstick showed blood in her urine.  She feels that the ureteral stone symptoms are different from her chronic intermittent upper abdominal and right upper quadrant abdominal pains.  These chronic symptoms date back 10 or 12 years.  They occur about once per week, sometimes a bit more than that.  When she has the pains that last for 1 or 2 hours and she could be nauseous with it.  Eating fried foods especially can bring it on.  She will sometimes have diarrhea around that time.  She takes Aleve 4 times per week.  Her weight is up 2 pounds in the past 6 months and 15 pounds in the past 4 years.  Old Data Reviewed:     Review of systems: Pertinent positive and negative review of systems were noted in the above HPI section. All other review negative.   Past Medical  History:  Diagnosis Date   Anxiety    Chronic headaches 09/28/2009   DEPRESSION 09/28/2009   Fibromyalgia    History of kidney stones    HYPERTENSION 09/28/2009   IRREGULAR MENSES 09/28/2009   RUQ pain    Thyroid disease     Past Surgical History:  Procedure Laterality Date   CESAREAN SECTION  1997   TONSILLECTOMY  1973    Current Outpatient Medications  Medication Instructions   acetaminophen (TYLENOL) 500 mg, Oral, Every 6 hours PRN,     albuterol (VENTOLIN HFA) 108 (90 Base) MCG/ACT inhaler 1-2 puffs, Inhalation, Every 6 hours PRN   diclofenac Sodium (VOLTAREN) 2 g, Topical, 4 times daily   escitalopram (LEXAPRO) 10 mg, Oral, Daily   HYDROcodone-acetaminophen (NORCO/VICODIN) 5-325 MG tablet 1 tablet, Oral, Every 6 hours PRN   ibuprofen (ADVIL) 600 mg, Oral, Every 6 hours PRN   levothyroxine (SYNTHROID) 88 MCG tablet TAKE 1 TABLET BY MOUTH DAILY BEFORE BREAKFAST.   losartan (COZAAR) 50 mg, Oral, Daily   SUMAtriptan (IMITREX) 100 MG tablet Take one tablet at onset of migraine and may repeat one in 2 hours as needed (max 2 in 24 hours)   tamsulosin (FLOMAX) 0.4 mg, Oral, Daily    Allergies as of 05/17/2021 - Review Complete 05/17/2021  Allergen Reaction Noted   Doxycycline  01/16/2016   Penicillins  09/28/2009    Family History  Problem Relation Age of Onset   Hypertension Mother  Hashimoto's thyroiditis Sister    Breast cancer Maternal Grandmother    Heart disease Maternal Grandfather    Cancer Paternal Grandmother        breast cancer   Heart disease Paternal Grandfather    Colon cancer Neg Hx    Colon polyps Neg Hx    Esophageal cancer Neg Hx    Rectal cancer Neg Hx    Stomach cancer Neg Hx     Social History   Socioeconomic History   Marital status: Married    Spouse name: Not on file   Number of children: Not on file   Years of education: Not on file   Highest education level: Not on file  Occupational History   Not on file  Tobacco Use   Smoking  status: Never   Smokeless tobacco: Never  Vaping Use   Vaping Use: Never used  Substance and Sexual Activity   Alcohol use: Yes    Comment: occ   Drug use: No   Sexual activity: Not on file  Other Topics Concern   Not on file  Social History Narrative   Not on file   Social Determinants of Health   Financial Resource Strain: Not on file  Food Insecurity: Not on file  Transportation Needs: Not on file  Physical Activity: Not on file  Stress: Not on file  Social Connections: Not on file  Intimate Partner Violence: Not on file     Physical Exam: BP 132/80   Pulse 97   Ht '5\' 3"'$  (1.6 m)   Wt 198 lb (89.8 kg)   LMP 12/26/2012   BMI 35.07 kg/m  Constitutional: generally well-appearing Psychiatric: alert and oriented x3 Eyes: extraocular movements intact Mouth: oral pharynx moist, no lesions Neck: supple no lymphadenopathy Cardiovascular: heart regular rate and rhythm Lungs: clear to auscultation bilaterally Abdomen: soft, mildly tender throughout nondistended, no obvious ascites, no peritoneal signs, normal bowel sounds Extremities: no lower extremity edema bilaterally Skin: no lesions on visible extremities   Assessment and plan: 56 y.o. female with recent right ureteral stone, also chronic intermittent upper abdominal and right upper quadrant pains  I am not sure that gallbladder pathology explains any or all of her symptoms.  It did seem to function poorly about 10 or 12 years ago and I referred her to a surgeon but she never went through with that referral.  She is having pains that are somewhat biliary like and I recommended we test her gallbladder again with ultrasound and if it looks normal by ultrasound then follow-up HIDA scan with CCK to estimate gallbladder ejection fraction.  She understands if all these tests are negative and unhelpful then she would probably need upper endoscopy.   Please see the "Patient Instructions" section for addition details about the  plan.   Owens Loffler, MD [facility] 05/17/2021, 3:49 PM  Cc: Eulas Post, MD  Total time on date of encounter was 46  minutes (this included time spent preparing to see the patient reviewing records; obtaining and/or reviewing separately obtained history; performing a medically appropriate exam and/or evaluation; counseling and educating the patient and family if present; ordering medications, tests or procedures if applicable; and documenting clinical information in the health record).

## 2021-05-17 NOTE — Patient Instructions (Addendum)
If you are age 57 or younger, your body mass index should be between 19-25. Your Body mass index is 35.07 kg/m?[name] If this is out of the aformentioned range listed, please consider follow up with your Primary Care Provider.  ?______________________________________________________ ? ?The [facility] providers would like to encourage you to use [facility] to communicate with providers for non-urgent requests or questions.  Due to long hold times on the telephone, sending your provider a message by [facility] may be a faster and more efficient way to get a response.  Please allow 48 business hours for a response.  Please remember that this is for non-urgent requests.  ?_______________________________________________________ ? ?You have been scheduled for an abdominal ultrasound at [facility] (1st floor of hospital) on 05-22-21 at 8:00am . Please arrive 30 minutes prior to your appointment for registration. Make certain not to have anything to eat or drink 8 hours prior to your appointment. Should you need to reschedule your appointment, please contact radiology at [phone]. This test typically takes about 30 minutes to perform. ? ? ?Thank you for entrusting me with your care and choosing [facility]. ? ?Dr Ardis Hughs ? ?

## 2021-05-19 ENCOUNTER — Encounter: Payer: Self-pay | Admitting: Family Medicine

## 2021-05-19 ENCOUNTER — Ambulatory Visit: Payer: BC Managed Care – PPO | Admitting: Family Medicine

## 2021-05-19 VITALS — BP 120/82 | HR 84 | Temp 97.6°F | Ht 63.0 in | Wt 201.2 lb

## 2021-05-19 DIAGNOSIS — N200 Calculus of kidney: Secondary | ICD-10-CM | POA: Diagnosis not present

## 2021-05-19 DIAGNOSIS — R300 Dysuria: Secondary | ICD-10-CM | POA: Diagnosis not present

## 2021-05-19 DIAGNOSIS — I700 Atherosclerosis of aorta: Secondary | ICD-10-CM | POA: Diagnosis not present

## 2021-05-19 DIAGNOSIS — K760 Fatty (change of) liver, not elsewhere classified: Secondary | ICD-10-CM | POA: Diagnosis not present

## 2021-05-19 LAB — POC URINALSYSI DIPSTICK (AUTOMATED)
Bilirubin, UA: NEGATIVE
Blood, UA: NEGATIVE
Glucose, UA: NEGATIVE
Ketones, UA: NEGATIVE
Leukocytes, UA: NEGATIVE
Nitrite, UA: NEGATIVE
Protein, UA: NEGATIVE
Spec Grav, UA: 1.01 (ref 1.010–1.025)
Urobilinogen, UA: 0.2 E.U./dL
pH, UA: 6 (ref 5.0–8.0)

## 2021-05-19 NOTE — Progress Notes (Signed)
Established Patient Office Visit  Subjective   Patient ID: Natasha Fitzgerald, female    DOB: [birth year]  Age: 56 y.o. MRN: [id number]  Chief Complaint  Patient presents with   Follow-up    Pt states she had kidney stone dx on Monday. On Flomax    HPI   Patient here for follow-up regarding recent kidney stone.  She was seen here couple weeks ago with some dysuria.  Urine at that point revealed some blood.  Urine culture came back negative.  About 5 days later she developed fairly severe right flank pain.  Went to the ER.  CT showed 2 to 3 mm proximal right ureter stone.  She was seen by urologist couple days later.  Her pain medication was changed.  She had been placed on Flomax 0.4 mg daily.  She finally had resolution of pain earlier this week.  No burning with urination.  No gross hematuria.  No flank pain at this time.  She has had some intermittent right upper quadrant pain and initially thought her pain was related to gallstones.  CT scan did show fatty liver changes.  There was also mention of "scattered aortic atherosclerosis "without evidence for aneurysm. No premature CAD in her mother.  Her biologic father's health is relatively unknown.  Patient has no history of diabetes.  Non-smoker.  She has hypertension which is controlled. No alcohol use.  Does consume considerable high glycemic foods in terms of sodas.  She is trying to scale back.  Also eats out frequently.  Usually only eats 2 meals per day.  Past Medical History:  Diagnosis Date   Anxiety    Chronic headaches 09/28/2009   DEPRESSION 09/28/2009   Fibromyalgia    History of kidney stones    HYPERTENSION 09/28/2009   IRREGULAR MENSES 09/28/2009   RUQ pain    Thyroid disease    Past Surgical History:  Procedure Laterality Date   [place]    reports that she has never smoked. She has never used smokeless tobacco. She reports current alcohol use. She reports that she does not  use drugs. family history includes Breast cancer in her maternal grandmother; Cancer in her paternal grandmother; Hashimoto's thyroiditis in her sister; Heart disease in her maternal grandfather and paternal grandfather; Hypertension in her mother. Allergies  Allergen Reactions   Doxycycline     "headache, irritability, upset stomach"   Penicillins     REACTION: hives    The 10-year ASCVD risk score (Arnett DK, et al., 2019) is: 3%   Values used to calculate the score:     Age: 21 years     Sex: Female     Is Non-Hispanic African American: No     Diabetic: No     Tobacco smoker: No     Systolic Blood Pressure: 353 mmHg     Is BP treated: Yes     HDL Cholesterol: 49 mg/dL     Total Cholesterol: 211 mg/dL  Review of Systems  Constitutional:  Negative for chills and fever.  Respiratory:  Negative for cough and shortness of breath.   Cardiovascular:  Negative for chest pain.  Gastrointestinal:  Negative for abdominal pain.  Genitourinary:  Negative for dysuria and hematuria.     Objective:     BP 120/82 (BP Location: Left Arm, Patient Position: Sitting, Cuff Size: Normal)   Pulse 84   Temp 97.6 F (36.4 C) (Oral)   Ht 5'  3" (1.6 m)   Wt 201 lb 3.2 oz (91.3 kg)   LMP 12/26/2012   SpO2 94%   BMI 35.64 kg/m  BP Readings from Last 3 Encounters:  05/19/21 120/82  05/17/21 132/80  05/09/21 123/73   Wt Readings from Last 3 Encounters:  05/19/21 201 lb 3.2 oz (91.3 kg)  05/17/21 198 lb (89.8 kg)  05/03/21 197 lb 9.6 oz (89.6 kg)      Physical Exam Vitals reviewed.  Cardiovascular:     Rate and Rhythm: Normal rate and regular rhythm.  Pulmonary:     Effort: Pulmonary effort is normal.     Breath sounds: Normal breath sounds.  Neurological:     Mental Status: She is alert.     Results for orders placed or performed in visit on 05/19/21  POCT Urinalysis Dipstick (Automated)  Result Value Ref Range   Color, UA yellow    Clarity, UA clear    Glucose, UA Negative  Negative   Bilirubin, UA negative    Ketones, UA negative    Spec Grav, UA 1.010 1.010 - 1.025   Blood, UA negative    pH, UA 6.0 5.0 - 8.0   Protein, UA Negative Negative   Urobilinogen, UA 0.2 0.2 or 1.0 E.U./dL   Nitrite, UA negative    Leukocytes, UA Negative Negative    Last CBC Lab Results  Component Value Date   WBC 9.0 05/09/2021   HGB 16.0 (H) 05/09/2021   HCT 48.5 (H) 05/09/2021   MCV 86.9 05/09/2021   MCH 28.7 05/09/2021   RDW 13.4 05/09/2021   PLT 301 66/29/4765   Last metabolic panel Lab Results  Component Value Date   GLUCOSE 105 (H) 05/09/2021   NA 139 05/09/2021   K 3.9 05/09/2021   CL 102 05/09/2021   CO2 25 05/09/2021   BUN 15 05/09/2021   CREATININE 0.79 05/09/2021   GFRNONAA >60 05/09/2021   CALCIUM 9.7 05/09/2021   PROT 7.8 05/09/2021   ALBUMIN 4.9 05/09/2021   BILITOT 0.5 05/09/2021   ALKPHOS 85 05/09/2021   AST 15 05/09/2021   ALT 19 05/09/2021   ANIONGAP 12 05/09/2021   Last lipids Lab Results  Component Value Date   CHOL 211 (H) 07/27/2020   HDL 49.00 07/27/2020   LDLCALC 139 (H) 07/27/2020   LDLDIRECT 178.9 06/17/2012   TRIG 111.0 07/27/2020   CHOLHDL 4 07/27/2020   Last hemoglobin A1c No results found for: HGBA1C    The 10-year ASCVD risk score (Arnett DK, et al., 2019) is: 3%    Assessment & Plan:   #1 recent kidney stone.  Symptomatically improved at this time.  Urine dipstick today reveals no blood.  She has follow-up with urology in a couple of weeks.  She did have urine strainer but no stone was collected.  She quit using the strainer after couple days She knows importance of staying well-hydrated.  #2 fatty liver changes on recent CT scan.  We talked about the significance of this including potential for developing nonalcoholic cirrhosis.  We strongly advocated she try to scale back sugars and starches and lose some weight.  We had a long discussion regarding potential weight loss medication options.  She is reluctant  to consider injectables.  She would like to try some things on her own first.  She will try reducing sugars and also cut back on eating out.  Set up follow-up in about 6 weeks to reassess.  #3 mention of aortic atherosclerosis on  CT scan.  She had mildly elevated lipids in the past.  Controlled hypertension.  10-year risk of CAD only 3%. We did discuss possible coronary calcium scan and she will consider.  Discuss at follow-up.  She is reluctant to consider statins at this time   Return in about 1 month (around 06/19/2021).    Carolann Littler, MD

## 2021-05-19 NOTE — Progress Notes (Deleted)
The 10-year ASCVD risk score (Arnett DK, et al., 2019) is: 3%   Values used to calculate the score:     Age: 56 years     Sex: Female     Is Non-Hispanic African American: No     Diabetic: No     Tobacco smoker: No     Systolic Blood Pressure: 462 mmHg     Is BP treated: Yes     HDL Cholesterol: 49 mg/dL     Total Cholesterol: 211 mg/dL

## 2021-05-21 DIAGNOSIS — K760 Fatty (change of) liver, not elsewhere classified: Secondary | ICD-10-CM | POA: Insufficient documentation

## 2021-05-22 ENCOUNTER — Ambulatory Visit (HOSPITAL_COMMUNITY): Payer: BC Managed Care – PPO

## 2021-05-24 ENCOUNTER — Ambulatory Visit (HOSPITAL_COMMUNITY)
Admission: RE | Admit: 2021-05-24 | Discharge: 2021-05-24 | Disposition: A | Discharge status: Home / Self Care | Payer: BC Managed Care – PPO | Source: Ambulatory Visit | Attending: Gastroenterology | Admitting: Gastroenterology

## 2021-05-24 DIAGNOSIS — R1010 Upper abdominal pain, unspecified: Secondary | ICD-10-CM | POA: Insufficient documentation

## 2021-05-24 DIAGNOSIS — R1011 Right upper quadrant pain: Secondary | ICD-10-CM | POA: Diagnosis present

## 2021-05-24 DIAGNOSIS — R1013 Epigastric pain: Secondary | ICD-10-CM | POA: Insufficient documentation

## 2021-05-30 ENCOUNTER — Other Ambulatory Visit: Payer: Self-pay

## 2021-05-30 DIAGNOSIS — R1011 Right upper quadrant pain: Secondary | ICD-10-CM

## 2021-05-31 ENCOUNTER — Other Ambulatory Visit: Payer: Self-pay | Admitting: Family Medicine

## 2021-06-08 ENCOUNTER — Ambulatory Visit (HOSPITAL_COMMUNITY): Payer: BC Managed Care – PPO

## 2021-06-20 ENCOUNTER — Ambulatory Visit (HOSPITAL_COMMUNITY)
Admission: RE | Admit: 2021-06-20 | Discharge: 2021-06-20 | Disposition: A | Discharge status: Home / Self Care | Payer: BC Managed Care – PPO | Source: Ambulatory Visit | Attending: Gastroenterology | Admitting: Gastroenterology

## 2021-06-20 DIAGNOSIS — R1011 Right upper quadrant pain: Secondary | ICD-10-CM | POA: Insufficient documentation

## 2021-06-20 MED ORDER — TECHNETIUM TC 99M MEBROFENIN IV KIT
5.0000 | PACK | Freq: Once | INTRAVENOUS | Status: AC | PRN
  Administered 2021-06-20: 5 via INTRAVENOUS

## 2021-06-29 ENCOUNTER — Other Ambulatory Visit: Payer: Self-pay | Admitting: Family Medicine

## 2021-06-30 ENCOUNTER — Ambulatory Visit (INDEPENDENT_AMBULATORY_CARE_PROVIDER_SITE_OTHER): Payer: BC Managed Care – PPO | Admitting: Family Medicine

## 2021-06-30 ENCOUNTER — Encounter: Payer: Self-pay | Admitting: Family Medicine

## 2021-06-30 VITALS — BP 124/80 | HR 78 | Temp 97.7°F | Ht 63.0 in | Wt 191.4 lb

## 2021-06-30 DIAGNOSIS — K760 Fatty (change of) liver, not elsewhere classified: Secondary | ICD-10-CM | POA: Diagnosis not present

## 2021-06-30 NOTE — Progress Notes (Signed)
Established Patient Office Visit  Subjective   Patient ID: Natasha Fitzgerald, female    DOB: 11-10-1965  Age: 56 y.o. MRN: [id number]  Chief Complaint  Patient presents with   Follow-up    HPI   Natasha Fitzgerald is seen for medical follow-up.  Recent upper abdominal pain.  She had HIDA scan last week which was normal.  She had recent kidney stone.  No further problems with that.  She had some recent imaging which showed fatty liver changes.  We strongly advocated she try to lose some weight.  She has made some tremendous lifestyle changes since then and has already lost 10 pounds in the past month.  She eliminated Sprite.  She is calorie counting with my fitness pal.  Just got a Apple watch-and plans to monitor activity with that.  She is made some changes in dietary choices as well.  Trying to restrict high glycemic foods.  Overall feels well.    She does mention that her mother has to get phlebotomies periodically and may have some sort of "iron overload".  She was not familiar specifically with hemochromatosis.  Past Medical History:  Diagnosis Date   Anxiety    Chronic headaches 09/28/2009   DEPRESSION 09/28/2009   Fibromyalgia    History of kidney stones    HYPERTENSION 09/28/2009   IRREGULAR MENSES 09/28/2009   RUQ pain    Thyroid disease    Past Surgical History:  Procedure Laterality Date   [place]    reports that she has never smoked. She has never used smokeless tobacco. She reports current alcohol use. She reports that she does not use drugs. family history includes Breast cancer in her maternal grandmother; Cancer in her paternal grandmother; Hashimoto's thyroiditis in her sister; Heart disease in her maternal grandfather and paternal grandfather; Hypertension in her mother. Allergies  Allergen Reactions   Doxycycline     "headache, irritability, upset stomach"   Penicillins     REACTION: hives    Review of Systems  Constitutional:   Negative for chills.  Respiratory:  Negative for shortness of breath.   Cardiovascular:  Negative for chest pain.  Genitourinary:  Negative for dysuria.      Objective:     BP 124/80 (BP Location: Left Arm, Patient Position: Sitting, Cuff Size: Normal)   Pulse 78   Temp 97.7 F (36.5 C) (Oral)   Ht '5\' 3"'$  (1.6 m)   Wt 191 lb 6.4 oz (86.8 kg)   LMP 12/26/2012   SpO2 98%   BMI 33.90 kg/m  BP Readings from Last 3 Encounters:  06/30/21 124/80  05/19/21 120/82  05/17/21 132/80   Wt Readings from Last 3 Encounters:  06/30/21 191 lb 6.4 oz (86.8 kg)  05/19/21 201 lb 3.2 oz (91.3 kg)  05/17/21 198 lb (89.8 kg)      Physical Exam Vitals reviewed.  Cardiovascular:     Rate and Rhythm: Normal rate and regular rhythm.  Pulmonary:     Effort: Pulmonary effort is normal.     Breath sounds: Normal breath sounds. No wheezing or rales.  Neurological:     Mental Status: She is alert.      No results found for any visits on 06/30/21.  Last CBC Lab Results  Component Value Date   WBC 9.0 05/09/2021   HGB 16.0 (H) 05/09/2021   HCT 48.5 (H) 05/09/2021   MCV 86.9 05/09/2021   MCH 28.7 05/09/2021  RDW 13.4 05/09/2021   PLT 301 32/12/2480   Last metabolic panel Lab Results  Component Value Date   GLUCOSE 105 (H) 05/09/2021   NA 139 05/09/2021   K 3.9 05/09/2021   CL 102 05/09/2021   CO2 25 05/09/2021   BUN 15 05/09/2021   CREATININE 0.79 05/09/2021   GFRNONAA >60 05/09/2021   CALCIUM 9.7 05/09/2021   PROT 7.8 05/09/2021   ALBUMIN 4.9 05/09/2021   BILITOT 0.5 05/09/2021   ALKPHOS 85 05/09/2021   AST 15 05/09/2021   ALT 19 05/09/2021   ANIONGAP 12 05/09/2021   Last lipids Lab Results  Component Value Date   CHOL 211 (H) 07/27/2020   HDL 49.00 07/27/2020   LDLCALC 139 (H) 07/27/2020   LDLDIRECT 178.9 06/17/2012   TRIG 111.0 07/27/2020   CHOLHDL 4 07/27/2020   Last thyroid functions Lab Results  Component Value Date   TSH 4.41 07/27/2020      The  10-year ASCVD risk score (Arnett DK, et al., 2019) is: 3.2%    Assessment & Plan:   #1 obesity with fatty liver changes on recent imaging.  She has done tremendous job with lifestyle changes as above and is already lost 10 pounds.  She does not use any alcohol.  Continue healthy lifestyle changes.  She is aware that with weight loss she will likely have some plateaus soon and continue on her current plan.  Try to establish more consistent exercise as though she has been limited somewhat by arthritis pains  #2 possible family history of "iron overload "as above.  We have asked that she try to get details with regard to her mom's illness whether this is hemochromatosis, polycythemia vera, or other inherited disorder  Return in about 3 months (around 09/30/2021).    Carolann Littler, MD

## 2021-06-30 NOTE — Patient Instructions (Signed)
Keep up the good work!  Very proud of the changes you have made.    Confirm if mother has hemochromatosis or polycythemia vera.

## 2021-08-22 ENCOUNTER — Other Ambulatory Visit: Payer: Self-pay | Admitting: Family Medicine

## 2021-08-30 ENCOUNTER — Other Ambulatory Visit: Payer: Self-pay

## 2021-08-30 MED ORDER — LOSARTAN POTASSIUM 25 MG PO TABS: 50.0000 mg | ORAL_TABLET | Freq: Every day | ORAL | 0 refills | Status: DC

## 2021-09-13 ENCOUNTER — Encounter: Payer: Self-pay | Admitting: Family Medicine

## 2021-09-13 ENCOUNTER — Ambulatory Visit: Payer: BC Managed Care – PPO | Admitting: Family Medicine

## 2021-09-13 VITALS — BP 138/96

## 2021-09-13 DIAGNOSIS — M797 Fibromyalgia: Secondary | ICD-10-CM

## 2021-09-13 DIAGNOSIS — I10 Essential (primary) hypertension: Secondary | ICD-10-CM | POA: Diagnosis not present

## 2021-09-13 DIAGNOSIS — F339 Major depressive disorder, recurrent, unspecified: Secondary | ICD-10-CM

## 2021-09-13 MED ORDER — DULOXETINE HCL 30 MG PO CPEP: ORAL_CAPSULE | ORAL | 3 refills | Status: DC

## 2021-09-13 NOTE — Progress Notes (Signed)
Established Patient Office Visit  Subjective   Patient ID: Natasha Fitzgerald, female    DOB: 25-Oct-1965  Age: 56 y.o. MRN: [id number]  Chief Complaint  Patient presents with   Depression   Anxiety   Chest Pain    Patient complains of chest pain, x1 month    Shortness of Breath    Patient complains of shortness of breath    HPI   Natasha Fitzgerald is here for multiple issues as follows  She has history of recurrent depression.  She had some recent increased anxiety symptoms and some depression as well.  She is feeling a lot of work stress.  Husband currently out of town for 2 weeks with work and she feels more stressed with that.  She would like to consider possible counseling options.  She has been on Lexapro 10 mg daily for some time but would like to explore possibly other antidepressants.  She has frequent musculoskeletal complaints.  Has been diagnosed in past with fibromyalgia.  Poor sleep quality frequently.  This likely exacerbates her chronic musculoskeletal pain.  Blood pressure up today but generally has been better controlled on losartan 50 mg daily.  She had some atypical chest pain at rest but none with exertion.  Symptoms have gone on for weeks if not months.  No radiation.  No diaphoresis.  No nausea or vomiting.  Has never had exertional symptoms.  Past Medical History:  Diagnosis Date   Anxiety    Chronic headaches 09/28/2009   DEPRESSION 09/28/2009   Fibromyalgia    History of kidney stones    HYPERTENSION 09/28/2009   IRREGULAR MENSES 09/28/2009   RUQ pain    Thyroid disease    Past Surgical History:  Procedure Laterality Date   [place]    reports that she has never smoked. She has never used smokeless tobacco. She reports current alcohol use. She reports that she does not use drugs. family history includes Breast cancer in her maternal grandmother; Cancer in her paternal grandmother; Hashimoto's thyroiditis in her sister;  Heart disease in her maternal grandfather and paternal grandfather; Hypertension in her mother. Allergies  Allergen Reactions   Doxycycline     "headache, irritability, upset stomach"   Penicillins     REACTION: hives    Review of Systems  Constitutional:  Positive for malaise/fatigue. Negative for chills and fever.  Eyes:  Negative for blurred vision.  Respiratory:  Negative for shortness of breath.   Cardiovascular:  Negative for chest pain.  Musculoskeletal:  Positive for myalgias. Negative for joint pain.  Neurological:  Negative for dizziness, weakness and headaches.  Psychiatric/Behavioral:  Positive for depression. Negative for suicidal ideas. The patient is nervous/anxious.       Objective:     BP (!) 138/96   LMP 12/26/2012  BP Readings from Last 3 Encounters:  09/13/21 (!) 138/96  06/30/21 124/80  05/19/21 120/82   Wt Readings from Last 3 Encounters:  06/30/21 191 lb 6.4 oz (86.8 kg)  05/19/21 201 lb 3.2 oz (91.3 kg)  05/17/21 198 lb (89.8 kg)      Physical Exam Vitals reviewed.  Constitutional:      Appearance: She is well-developed.  Cardiovascular:     Rate and Rhythm: Normal rate and regular rhythm.  Pulmonary:     Effort: Pulmonary effort is normal.  Musculoskeletal:     Comments: Multiple soft tissue tender points including several typical fibromyalgia points.  Neurological:  Mental Status: She is alert.  Psychiatric:        Mood and Affect: Mood normal.      No results found for any visits on 09/13/21.    The 10-year ASCVD risk score (Arnett DK, et al., 2019) is: 4%    Assessment & Plan:   #1 increased anxiety and depression symptoms.  She has likely fibromyalgia.  We did discuss possible change from Lexapro to Cymbalta and she would like to proceed along those directions. -Set up counseling.  We gave her a brochure for our behavioral health division -We will discontinue Lexapro and start Cymbalta 30 mg once daily for 1 week then  increase to 60 mg daily. -She has scheduled follow-up in October to reassess  #2 hypertension.  Poorly controlled today but did improve considerably with repeat reading.  Continue to monitor.  Continue weight loss efforts.  Reassess in October and if not further improved at that point consider additional medication.  #3 fibromyalgia.   Start Cymbalta as above.   Regular aerobic exercise, as tolerated.    No follow-ups on file.    Carolann Littler, MD

## 2021-09-28 ENCOUNTER — Ambulatory Visit: Payer: BC Managed Care – PPO | Admitting: Family Medicine

## 2021-09-28 ENCOUNTER — Other Ambulatory Visit: Payer: Self-pay | Admitting: Family Medicine

## 2021-09-29 ENCOUNTER — Encounter: Payer: Self-pay | Admitting: Family Medicine

## 2021-09-29 ENCOUNTER — Ambulatory Visit: Payer: BC Managed Care – PPO | Admitting: Family Medicine

## 2021-09-29 VITALS — BP 122/80 | HR 89 | Temp 98.0°F | Ht 63.0 in | Wt 186.0 lb

## 2021-09-29 DIAGNOSIS — I10 Essential (primary) hypertension: Secondary | ICD-10-CM

## 2021-09-29 DIAGNOSIS — E038 Other specified hypothyroidism: Secondary | ICD-10-CM | POA: Diagnosis not present

## 2021-09-29 DIAGNOSIS — F339 Major depressive disorder, recurrent, unspecified: Secondary | ICD-10-CM

## 2021-09-29 DIAGNOSIS — E669 Obesity, unspecified: Secondary | ICD-10-CM | POA: Diagnosis not present

## 2021-09-29 NOTE — Progress Notes (Signed)
Established Patient Office Visit  Subjective   Patient ID: Natasha Fitzgerald, female    DOB: 06-06-1965  Age: 56 y.o. MRN: [id number]  Chief Complaint  Patient presents with   Follow-up    HPI   Natasha Fitzgerald is here for medical follow-up.  She has history of hypertension, GERD, fatty liver changes, hypothyroidism, obesity, fibromyalgia, history of recurrent depression.  We recently had switched her from Lexapro to Cymbalta to see if this would help with any of her musculoskeletal complaints.  She took this for couple weeks but felt somewhat more agitated and also had some insomnia issues and stopped this.  She is currently not taking any antidepressant.  She would like to give this a trial of being off medication of any type.  She feels like her depression is currently stable.  No suicidal ideation.  Hypothyroidism treated with levothyroxine 88 mcg daily.  Compliant with therapy.  Overdue for thyroid labs.  She has had some success recently with weight loss.  She is using myfitnesspal to monitor calories.  She has lost about 15 pounds but seem to have plateaued slightly at this time.  She has lost the weight largely due to portion control, overall calorie reduction, and reduction in sugar intake.  She has hypertension treated with losartan 50 mg daily.  Blood pressure well controlled.  Past Medical History:  Diagnosis Date   Anxiety    Chronic headaches 09/28/2009   DEPRESSION 09/28/2009   Fibromyalgia    History of kidney stones    HYPERTENSION 09/28/2009   IRREGULAR MENSES 09/28/2009   RUQ pain    Thyroid disease    Past Surgical History:  Procedure Laterality Date   [place]    reports that she has never smoked. She has never used smokeless tobacco. She reports current alcohol use. She reports that she does not use drugs. family history includes Breast cancer in her maternal grandmother; Cancer in her paternal grandmother; Hashimoto's  thyroiditis in her sister; Heart disease in her maternal grandfather and paternal grandfather; Hypertension in her mother. Allergies  Allergen Reactions   Doxycycline     "headache, irritability, upset stomach"   Penicillins     REACTION: hives    Review of Systems  Constitutional:  Negative for malaise/fatigue.  Eyes:  Negative for blurred vision.  Respiratory:  Negative for shortness of breath.   Cardiovascular:  Negative for chest pain.  Gastrointestinal:  Negative for abdominal pain.  Neurological:  Negative for dizziness, weakness and headaches.      Objective:     BP 122/80   Pulse 89   Temp 98 F (36.7 C) (Oral)   Ht '5\' 3"'$  (1.6 m)   Wt 186 lb (84.4 kg)   LMP 12/26/2012   SpO2 97%   BMI 32.95 kg/m  BP Readings from Last 3 Encounters:  09/29/21 122/80  09/13/21 (!) 138/96  06/30/21 124/80   Wt Readings from Last 3 Encounters:  09/29/21 186 lb (84.4 kg)  06/30/21 191 lb 6.4 oz (86.8 kg)  05/19/21 201 lb 3.2 oz (91.3 kg)      Physical Exam Vitals reviewed.  Constitutional:      Appearance: She is well-developed.  Eyes:     Pupils: Pupils are equal, round, and reactive to light.  Neck:     Thyroid: No thyromegaly.     Vascular: No JVD.  Cardiovascular:     Rate and Rhythm: Normal rate and regular rhythm.  Heart sounds:     No gallop.  Pulmonary:     Effort: Pulmonary effort is normal. No respiratory distress.     Breath sounds: Normal breath sounds. No wheezing or rales.  Musculoskeletal:     Cervical back: Neck supple.     Right lower leg: No edema.     Left lower leg: No edema.  Neurological:     Mental Status: She is alert.      No results found for any visits on 09/29/21.    The 10-year ASCVD risk score (Arnett DK, et al., 2019) is: 3.1%    Assessment & Plan:   #1 hypothyroidism.  Patient due for follow-up labs.  Recheck TSH.  Continue levothyroxine and adjust dosage if necessary  #2 hypertension currently well controlled with  losartan 50 mg daily.  Continue current dose of losartan.  Continue weight loss efforts  #3 history of recurrent depression.  Patient did not tolerate Cymbalta as above.  She currently requests trial off medication.  Follow-up promptly for any recurrent depression symptoms  #4 obesity.  Patient has had some recent success with lifestyle modification.  Continue weight loss efforts.  If she continues to plateau consider reducing overall calorie goal from current level of 1520 to 1400.     No follow-ups on file.    Natasha Littler, MD

## 2021-09-30 LAB — TSH: TSH: 3.67 mIU/L (ref 0.40–4.50)

## 2021-10-05 ENCOUNTER — Ambulatory Visit: Payer: Self-pay

## 2021-10-05 ENCOUNTER — Ambulatory Visit: Payer: BC Managed Care – PPO | Admitting: Family Medicine

## 2021-10-05 ENCOUNTER — Other Ambulatory Visit: Payer: Self-pay | Admitting: Family Medicine

## 2021-10-05 VITALS — BP 134/86 | HR 87 | Ht 63.0 in | Wt 184.0 lb

## 2021-10-05 DIAGNOSIS — M76822 Posterior tibial tendinitis, left leg: Secondary | ICD-10-CM

## 2021-10-05 DIAGNOSIS — Z1231 Encounter for screening mammogram for malignant neoplasm of breast: Secondary | ICD-10-CM

## 2021-10-05 NOTE — Progress Notes (Signed)
I, Natasha Fitzgerald, LAT, ATC acting as a scribe for Natasha Leader, MD.  Natasha Fitzgerald is a 56 y.o. female who presents to [place] at [facility] today for cont'd L ankle pain thought to be due to posterior tibialis tendinitis. Pt was last seen by Dr. Georgina Snell on 03/22/21 for bilat knee pain and L ankle pain and was advised to cont PT, but pt only completed 1 additional visit, on 04/19/21. Pt was given a L posterior tib tendon steroid injection on 01/11/21. 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 Pt locates pain to the medial aspect of the L ankle and through the dorsum of the foot. Pt notes that she was cushioned slides when at home and does not go barefoot. Pt notes that she has stopped the Lexapro 1 weeks ago and feels like a "buzzing" in her head.   Dx imaging: 07/02/19 L foot XR  Pertinent review of systems: No fevers or chills  Relevant historical information: Hypertension   Exam:  BP 134/86   Pulse 87   Ht '5\' 3"'$  (1.6 m)   Wt 184 lb (83.5 kg)   LMP 12/26/2012   SpO2 99%   BMI 32.59 kg/m  General: Well Developed, well nourished, and in no acute distress.   MSK: Left ankle: Ankle pronation is present Tender palpation along the medial ankle along the course of the posterior tibialis tendon.  Normal foot and ankle motion.  Pain with resisted foot eversion. Mildly tender palpation of the dorsal midfoot.    Lab and Radiology Results  Procedure: Real-time Ultrasound Guided Injection of left posterior tibialis tendon sheath Device: Philips Affiniti 50G Images permanently stored and available for review in PACS Verbal informed consent obtained.  Discussed risks and benefits of procedure. Warned about infection, bleeding, hyperglycemia damage to structures among others. Patient expresses understanding and agreement Time-out conducted.   Noted no overlying erythema, induration, or other signs of local infection.   Skin prepped  in a sterile fashion.   Local anesthesia: Topical Ethyl chloride.   With sterile technique and under real time ultrasound guidance: 40 mg of Kenalog and 2 mL of lidocaine injected into tendon sheath around posterior tibialis tendon. Fluid seen entering the tendon sheath.   Completed without difficulty   Pain immediately resolved suggesting accurate placement of the medication.   Advised to call if fevers/chills, erythema, induration, drainage, or persistent bleeding.   Images permanently stored and available for review in the ultrasound unit.  Impression: Technically successful ultrasound guided injection.         Assessment and Plan: 56 y.o. female with left ankle pain partially due to posterior tibialis tendinitis.  Plan for repeat injection today.  Recommend CAM Walker boot for the next 2 weeks.  Following that good arch support and foot wear is can be very important.  Reviewed high-quality after market insoles to provide better arch support.  She can purchase these at [employer].  Continue home exercise program previously taught.  Check back as needed.     PDMP not reviewed this encounter. Orders Placed This Encounter  Procedures   [place] LIMITED JOINT SPACE STRUCTURES LOW LEFT(NO LINKED CHARGES)    Order Specific Question:   Reason for Exam (SYMPTOM  OR DIAGNOSIS REQUIRED)    Answer:   left ankle pain    Order Specific Question:   Preferred imaging location?    Answer:   [place]  No orders of the defined types were placed in this encounter.    Discussed warning signs or symptoms. Please see discharge instructions. Patient expresses understanding.   The above documentation has been reviewed and is accurate and complete Natasha Fitzgerald, M.D.

## 2021-10-05 NOTE — Patient Instructions (Addendum)
Thank you for coming in today.   You received an injection today. Seek immediate medical attention if the joint becomes red, extremely painful, or is oozing fluid.   Fleet feet- insoles for arch support  CAM walker boot for 2 weeks  Check back as needed

## 2021-10-17 ENCOUNTER — Encounter: Payer: Self-pay | Admitting: Family Medicine

## 2021-10-17 NOTE — Progress Notes (Unsigned)
   I, Peterson Lombard, LAT, ATC acting as a scribe for [name], MD.  Natasha Fitzgerald is a 56 y.o. female who presents to [place] at [facility] today for checkup on her left posterior tibial tendinopathy.  Patient was last seen by Dr. Georgina Snell on 10/05/2021 and was given a left posterior tibial tendon sheath steroid injection and advised to work on her home exercises as previously taught, wear the cam walker boot for the next 2 weeks, followed by good arch supports.  Patient sent a MyChart message on 1017, stating that she felt a "pop" in her left ankle and a recheck visit was scheduled.  Today, patient was just walking when she experienced the "pop: in her L ankle, bruising appeared after. Pt notes she has pain along the plantar aspect w/ notable "lump." Pt is leaving town tomorrow for a conference in [place]. Pt also has pain in her L heel, medial aspect of her ankle. She did notice bruising after the injection.  Left ankle swelling: yes- w/ bruising  Dx imaging: 07/02/19 L foot XR  Pertinent review of systems: No fevers or chills  Relevant historical information: Hypertension   Exam:  BP 138/86   Pulse 72   Ht '5\' 3"'$  (1.6 m)   Wt 184 lb (83.5 kg)   LMP 12/26/2012   SpO2 98%   BMI 32.59 kg/m  General: Well Developed, well nourished, and in no acute distress.   MSK: Left ankle some residual bruising at the site of the injection on the medial ankle.  Tender palpation along the course of the posterior tibialis tendon.  Strength is intact. Pulses cap refill and sensation are intact distally.    Lab and Radiology Results  Diagnostic Limited MSK Ultrasound of: Left medial ankle Posterior tibialis tendon appears to be intact without full-thickness rupture or tear. Some hypoechoic fluid tracks near or around the posterior tibialis tendon at the tarsal tunnel and there are few calcifications near the insertion of the posterior tibialis tendon. Impression: Posterior  tibialis tenosynovitis with some calcific changes near the insertion site.  Doubtful for full-thickness tear or rupture.     Assessment and Plan: 56 y.o. female with pain following injection for posterior tibialis tenosynovitis injection.  The injection occurred on 5 October.  She has been using a cam walker boot since.  Plan to continue the boot given pain and evidence of continued tenosynovitis.  We will give it a week or 2 and if not improving will proceed to MRI.  She will keep me updated.   PDMP not reviewed this encounter. Orders Placed This Encounter  Procedures   [place] LIMITED JOINT SPACE STRUCTURES LOW LEFT(NO LINKED CHARGES)    Order Specific Question:   Reason for Exam (SYMPTOM  OR DIAGNOSIS REQUIRED)    Answer:   left ankle pain    Order Specific Question:   Preferred imaging location?    Answer:   [place]   No orders of the defined types were placed in this encounter.    Discussed warning signs or symptoms. Please see discharge instructions. Patient expresses understanding.   The above documentation has been reviewed and is accurate and complete [name], M.D.

## 2021-10-18 ENCOUNTER — Ambulatory Visit: Payer: BC Managed Care – PPO | Admitting: Family Medicine

## 2021-10-18 ENCOUNTER — Ambulatory Visit: Payer: Self-pay

## 2021-10-18 VITALS — BP 138/86 | HR 72 | Ht 63.0 in | Wt 184.0 lb

## 2021-10-18 DIAGNOSIS — M76822 Posterior tibial tendinitis, left leg: Secondary | ICD-10-CM | POA: Diagnosis not present

## 2021-10-18 NOTE — Patient Instructions (Addendum)
Thank you for coming in today.   Send me an update in 2 weeks.

## 2021-10-19 ENCOUNTER — Other Ambulatory Visit: Payer: Self-pay | Admitting: Family Medicine

## 2021-10-27 ENCOUNTER — Encounter: Payer: Self-pay | Admitting: Family Medicine

## 2021-10-27 ENCOUNTER — Ambulatory Visit: Payer: BC Managed Care – PPO | Admitting: Family Medicine

## 2021-10-27 VITALS — BP 152/88 | HR 96 | Temp 97.8°F | Ht 63.0 in | Wt 183.4 lb

## 2021-10-27 DIAGNOSIS — J069 Acute upper respiratory infection, unspecified: Secondary | ICD-10-CM

## 2021-10-27 MED ORDER — IPRATROPIUM BROMIDE 0.03 % NA SOLN: 2.0000 | Freq: Two times a day (BID) | NASAL | 1 refills | Status: DC

## 2021-10-27 MED ORDER — HYDROCODONE BIT-HOMATROP MBR 5-1.5 MG/5ML PO SOLN: 5.0000 mL | Freq: Four times a day (QID) | ORAL | 0 refills | Status: DC | PRN

## 2021-10-27 NOTE — Progress Notes (Signed)
Established Patient Office Visit  Subjective   Patient ID: Natasha Fitzgerald, female    DOB: 01-Nov-1965  Age: 56 y.o. MRN: [id number]  Chief Complaint  Patient presents with   Sore Throat    Patient complains of sore throat, x4 days, Tried Claritin with little relief    Sinusitis    Patient complains of sinusitis, x4 days    Cough    Patient complains of cough, Productive    HPI   [name] is seen with upper respiratory symptoms which started this past Monday.  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  She developed some loss of voice and especially postnasal drip and now worsening cough.  She tried several over-the-counter medications including Claritin, Benadryl, and Allegra without relief.  Cough is severe at night and interfering with sleep.  She has not gotten benefit from over-the-counter cough suppressant such as Delsym.  She does have some mild sore throat symptoms.  Denies any nausea or vomiting.  No documented fever.  Past Medical History:  Diagnosis Date   Anxiety    Chronic headaches 09/28/2009   DEPRESSION 09/28/2009   Fibromyalgia    History of kidney stones    HYPERTENSION 09/28/2009   IRREGULAR MENSES 09/28/2009   RUQ pain    Thyroid disease    Past Surgical History:  Procedure Laterality Date   [place]    reports that she has never smoked. She has never used smokeless tobacco. She reports current alcohol use. She reports that she does not use drugs. family history includes Breast cancer in her maternal grandmother; Cancer in her paternal grandmother; Hashimoto's thyroiditis in her sister; Heart disease in her maternal grandfather and paternal grandfather; Hypertension in her mother. Allergies  Allergen Reactions   Doxycycline     "headache, irritability, upset stomach"   Penicillins     REACTION: hives    Review of Systems  Constitutional:  Positive for malaise/fatigue. Negative for fever.  HENT:   Positive for congestion and sore throat. Negative for ear pain.   Respiratory:  Positive for cough. Negative for shortness of breath and wheezing.       Objective:     BP (!) 152/88 (BP Location: Left Arm, Patient Position: Sitting, Cuff Size: Normal)   Pulse 96   Temp 97.8 F (36.6 C) (Oral)   Ht '5\' 3"'$  (1.6 m)   Wt 183 lb 6.4 oz (83.2 kg)   LMP 12/26/2012   SpO2 99%   BMI 32.49 kg/m  BP Readings from Last 3 Encounters:  10/27/21 (!) 152/88  10/18/21 138/86  10/05/21 134/86   Wt Readings from Last 3 Encounters:  10/27/21 183 lb 6.4 oz (83.2 kg)  10/18/21 184 lb (83.5 kg)  10/05/21 184 lb (83.5 kg)      Physical Exam Vitals reviewed.  Constitutional:      General: She is not in acute distress.    Appearance: She is well-developed.  HENT:     Right Ear: Tympanic membrane normal.     Left Ear: Tympanic membrane normal.     Mouth/Throat:     Mouth: Mucous membranes are moist.     Pharynx: Oropharynx is clear. No oropharyngeal exudate.  Cardiovascular:     Rate and Rhythm: Normal rate and regular rhythm.  Musculoskeletal:     Cervical back: Neck supple.  Neurological:     Mental Status: She is alert.      No results  found for any visits on 10/27/21.    The 10-year ASCVD risk score (Arnett DK, et al., 2019) is: 4.8%    Assessment & Plan:   Viral URI with cough.  Patient nontoxic in appearance.  Her blood pressure is up slightly today but in the setting of acute illness would observe.  -Consider Atrovent nasal 0.03% 2 sprays per nostril twice daily as needed for postnasal drip symptoms -Hycodan cough syrup 1 teaspoon every 6-8 hours as needed for severe cough-   as she has gotten no relief with over-the-counter medications -Follow-up immediately for any fever or worsening symptoms.  She is aware this may take 8 to 10 days to run its course   No follow-ups on file.    Carolann Littler, MD

## 2021-11-01 ENCOUNTER — Ambulatory Visit: Payer: BC Managed Care – PPO | Admitting: Family Medicine

## 2021-11-01 ENCOUNTER — Encounter: Payer: Self-pay | Admitting: Family Medicine

## 2021-11-01 VITALS — BP 130/80 | HR 86 | Temp 98.3°F | Ht 63.0 in | Wt 184.2 lb

## 2021-11-01 DIAGNOSIS — F339 Major depressive disorder, recurrent, unspecified: Secondary | ICD-10-CM

## 2021-11-01 MED ORDER — FLUOXETINE HCL 20 MG PO CAPS: 20.0000 mg | ORAL_CAPSULE | Freq: Every day | ORAL | 5 refills | Status: DC
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
## 2021-11-01 NOTE — Patient Instructions (Signed)
Give me some feedback in a few weeks regarding the Prozac  Set up counseling, as discussed.

## 2021-11-02 ENCOUNTER — Ambulatory Visit
Admission: RE | Admit: 2021-11-02 | Discharge: 2021-11-02 | Disposition: A | Discharge status: Home / Self Care | Payer: BC Managed Care – PPO | Source: Ambulatory Visit | Attending: Family Medicine | Admitting: Family Medicine

## 2021-11-02 DIAGNOSIS — Z1231 Encounter for screening mammogram for malignant neoplasm of breast: Secondary | ICD-10-CM

## 2021-11-18 ENCOUNTER — Other Ambulatory Visit: Payer: Self-pay | Admitting: Family Medicine

## 2021-11-21 ENCOUNTER — Other Ambulatory Visit: Payer: Self-pay | Admitting: Family Medicine

## 2021-11-23 ENCOUNTER — Other Ambulatory Visit: Payer: Self-pay | Admitting: Family Medicine

## 2021-12-20 ENCOUNTER — Other Ambulatory Visit: Payer: Self-pay | Admitting: Family Medicine

## 2022-01-30 ENCOUNTER — Ambulatory Visit (INDEPENDENT_AMBULATORY_CARE_PROVIDER_SITE_OTHER): Payer: BC Managed Care – PPO | Admitting: Family Medicine

## 2022-01-30 VITALS — BP 130/70 | HR 85 | Temp 98.4°F | Ht 63.0 in | Wt 177.9 lb

## 2022-01-30 DIAGNOSIS — F339 Major depressive disorder, recurrent, unspecified: Secondary | ICD-10-CM

## 2022-01-30 DIAGNOSIS — G43909 Migraine, unspecified, not intractable, without status migrainosus: Secondary | ICD-10-CM | POA: Insufficient documentation

## 2022-01-30 MED ORDER — FLUOXETINE HCL 40 MG PO CAPS: 40.0000 mg | ORAL_CAPSULE | Freq: Every day | ORAL | 5 refills | Status: DC

## 2022-01-30 MED ORDER — ELETRIPTAN HYDROBROMIDE 40 MG PO TABS: 40.0000 mg | ORAL_TABLET | ORAL | 0 refills | Status: DC | PRN
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
## 2022-02-13 ENCOUNTER — Encounter: Payer: Self-pay | Admitting: Family Medicine

## 2022-02-13 ENCOUNTER — Ambulatory Visit: Payer: BC Managed Care – PPO | Admitting: Family Medicine

## 2022-02-13 VITALS — BP 124/80 | HR 78 | Temp 98.7°F | Ht 63.0 in | Wt 177.4 lb

## 2022-02-13 DIAGNOSIS — M545 Low back pain, unspecified: Secondary | ICD-10-CM | POA: Diagnosis not present

## 2022-02-13 MED ORDER — CYCLOBENZAPRINE HCL 10 MG PO TABS: 10.0000 mg | ORAL_TABLET | Freq: Every day | ORAL | 0 refills | Status: DC

## 2022-02-13 NOTE — Progress Notes (Signed)
Established Patient Office Visit  Subjective   Patient ID: Natasha Fitzgerald, female    DOB: [birth year]  Age: 57 y.o. MRN: [id number]  Chief Complaint  Patient presents with   Back Pain    Patient complains of back pain, x2 weeks, Tried Ibuprofen and Aleve with little relief     HPI   Natasha Fitzgerald is seen with low back pain which is mid lower lumbar.  She did have a fall couple weeks ago was not sure her pain started then.  She has history of kidney stones about a year ago this pain is somewhat different.  This pain is worse with change of position.  No sciatica symptoms.  She feels like her muscles may be spasming at times.  She has lower lumbar pain near the center.  No radiculitis symptoms.  She tried ibuprofen and Aleve with little relief.  No urine or stool incontinence.  She says the Relpax is working well for her migraines.  She also feels like recent increase in Prozac to 40 mg daily is helping her depression considerably.  Her PHQ-9 score is much lower today which is encouraging.  Past Medical History:  Diagnosis Date   Anxiety    Chronic headaches 09/28/2009   DEPRESSION 09/28/2009   Fibromyalgia    History of kidney stones    HYPERTENSION 09/28/2009   IRREGULAR MENSES 09/28/2009   RUQ pain    Thyroid disease    Past Surgical History:  Procedure Laterality Date   [place]    reports that she has never smoked. She has never used smokeless tobacco. She reports current alcohol use. She reports that she does not use drugs. family history includes Breast cancer in her maternal grandmother; Cancer in her paternal grandmother; Hashimoto's thyroiditis in her sister; Heart disease in her maternal grandfather and paternal grandfather; Hypertension in her mother. Allergies  Allergen Reactions   Doxycycline     "headache, irritability, upset stomach"   Penicillins     REACTION: hives    Review of Systems  Constitutional:  Negative for  chills, fever and weight loss.  Genitourinary:  Negative for dysuria.  Musculoskeletal:  Positive for back pain.  Neurological:  Negative for focal weakness.      Objective:     BP 124/80 (BP Location: Left Arm, Patient Position: Sitting, Cuff Size: Normal)   Pulse 78   Temp 98.7 F (37.1 C) (Oral)   Ht 5' 3"$  (1.6 m)   Wt 177 lb 6.4 oz (80.5 kg)   LMP 12/26/2012   SpO2 98%   BMI 31.42 kg/m    Physical Exam Vitals reviewed.  Constitutional:      Appearance: Normal appearance.  Cardiovascular:     Rate and Rhythm: Normal rate and regular rhythm.  Pulmonary:     Effort: Pulmonary effort is normal.     Breath sounds: Normal breath sounds.  Musculoskeletal:     Comments: No spinal tenderness.  Straight leg raise negative bilaterally.  Neurological:     Mental Status: She is alert.     Comments: Full strength lower extremities.  Symmetric reflexes ankle and knee bilaterally.      No results found for any visits on 02/13/22.    The 10-year ASCVD risk score (Arnett DK, et al., 2019) is: 3.5%    Assessment & Plan:   Lower lumbar back pain without sciatica features.  Suspect musculoskeletal.  Doubt related to her kidney stone history.  This pain is worse with movement.  Nonfocal neuroexam.  -Recommend extension stretches with handout given -Continue ibuprofen or Aleve as needed -Refill Flexeril 10 mg nightly which she has taken in the past and be in touch in a couple weeks if not improving   Carolann Littler, MD

## 2022-02-22 IMAGING — DX DG KNEE AP/LAT W/ SUNRISE*L*
3 series · 3 of 3 positions shown · non-contrast
Comparison: None.

CLINICAL DATA: Recent fall, left knee pain

EXAM:
LEFT KNEE 3 VIEWS

[knee ap]
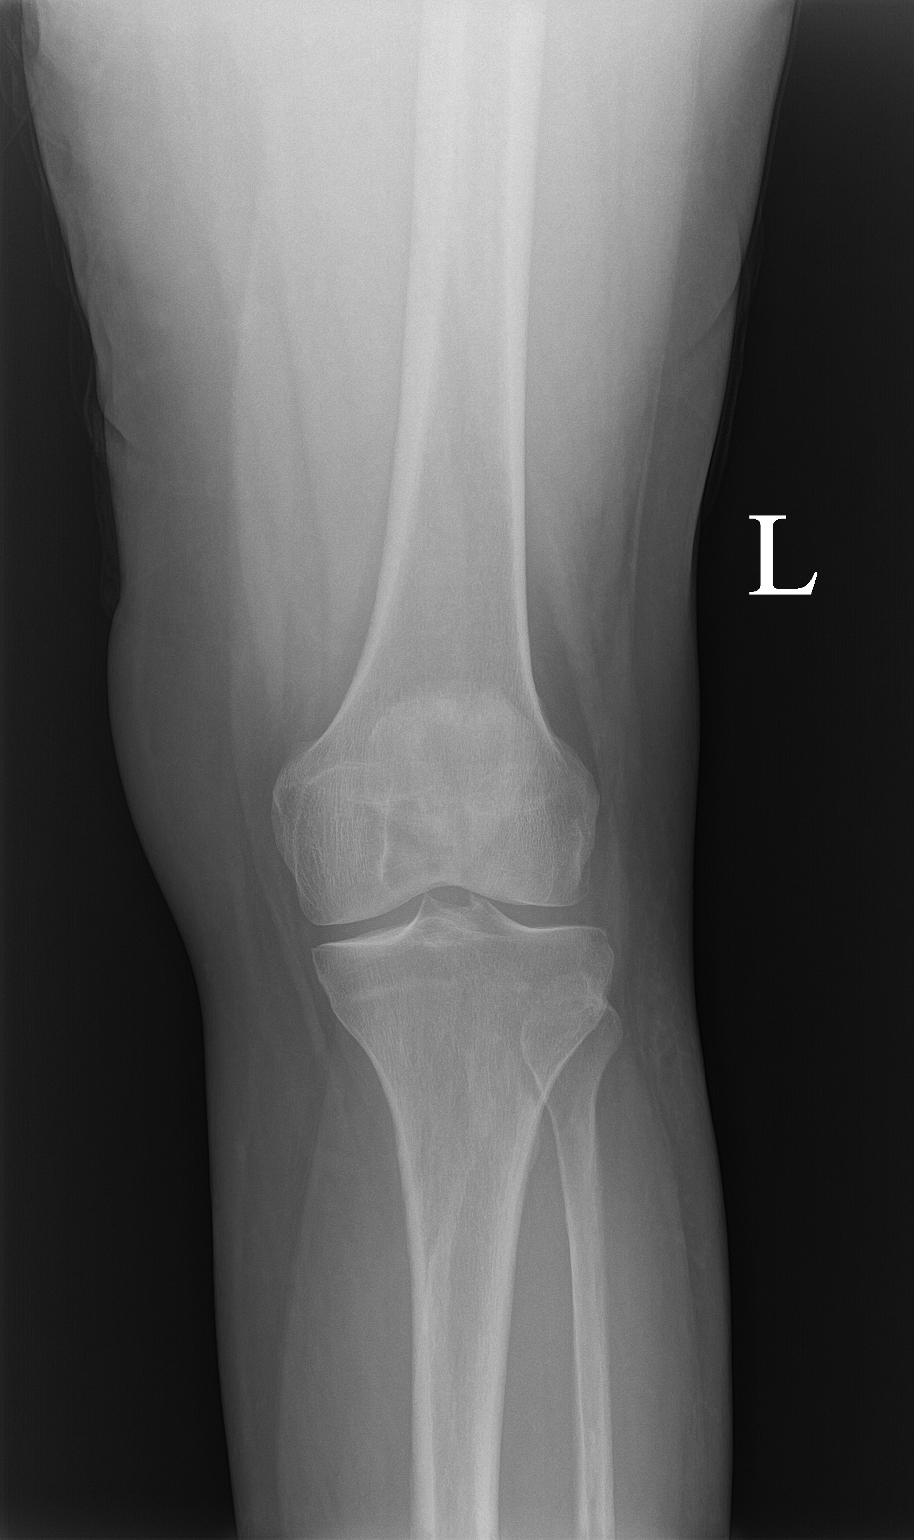

[knee lat]
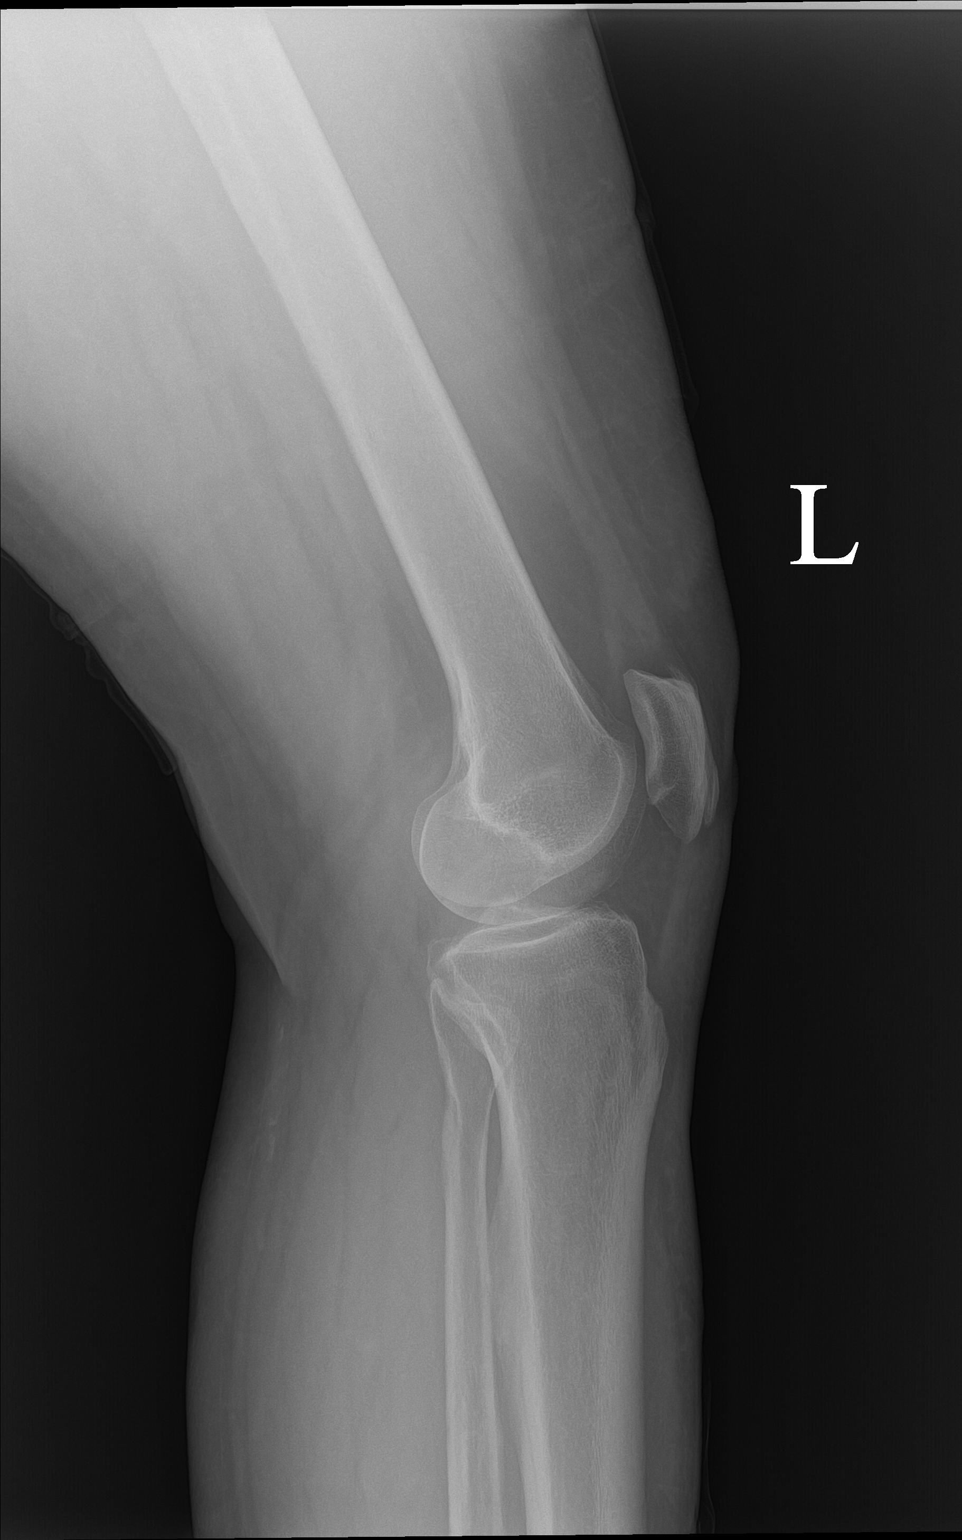

[patella]
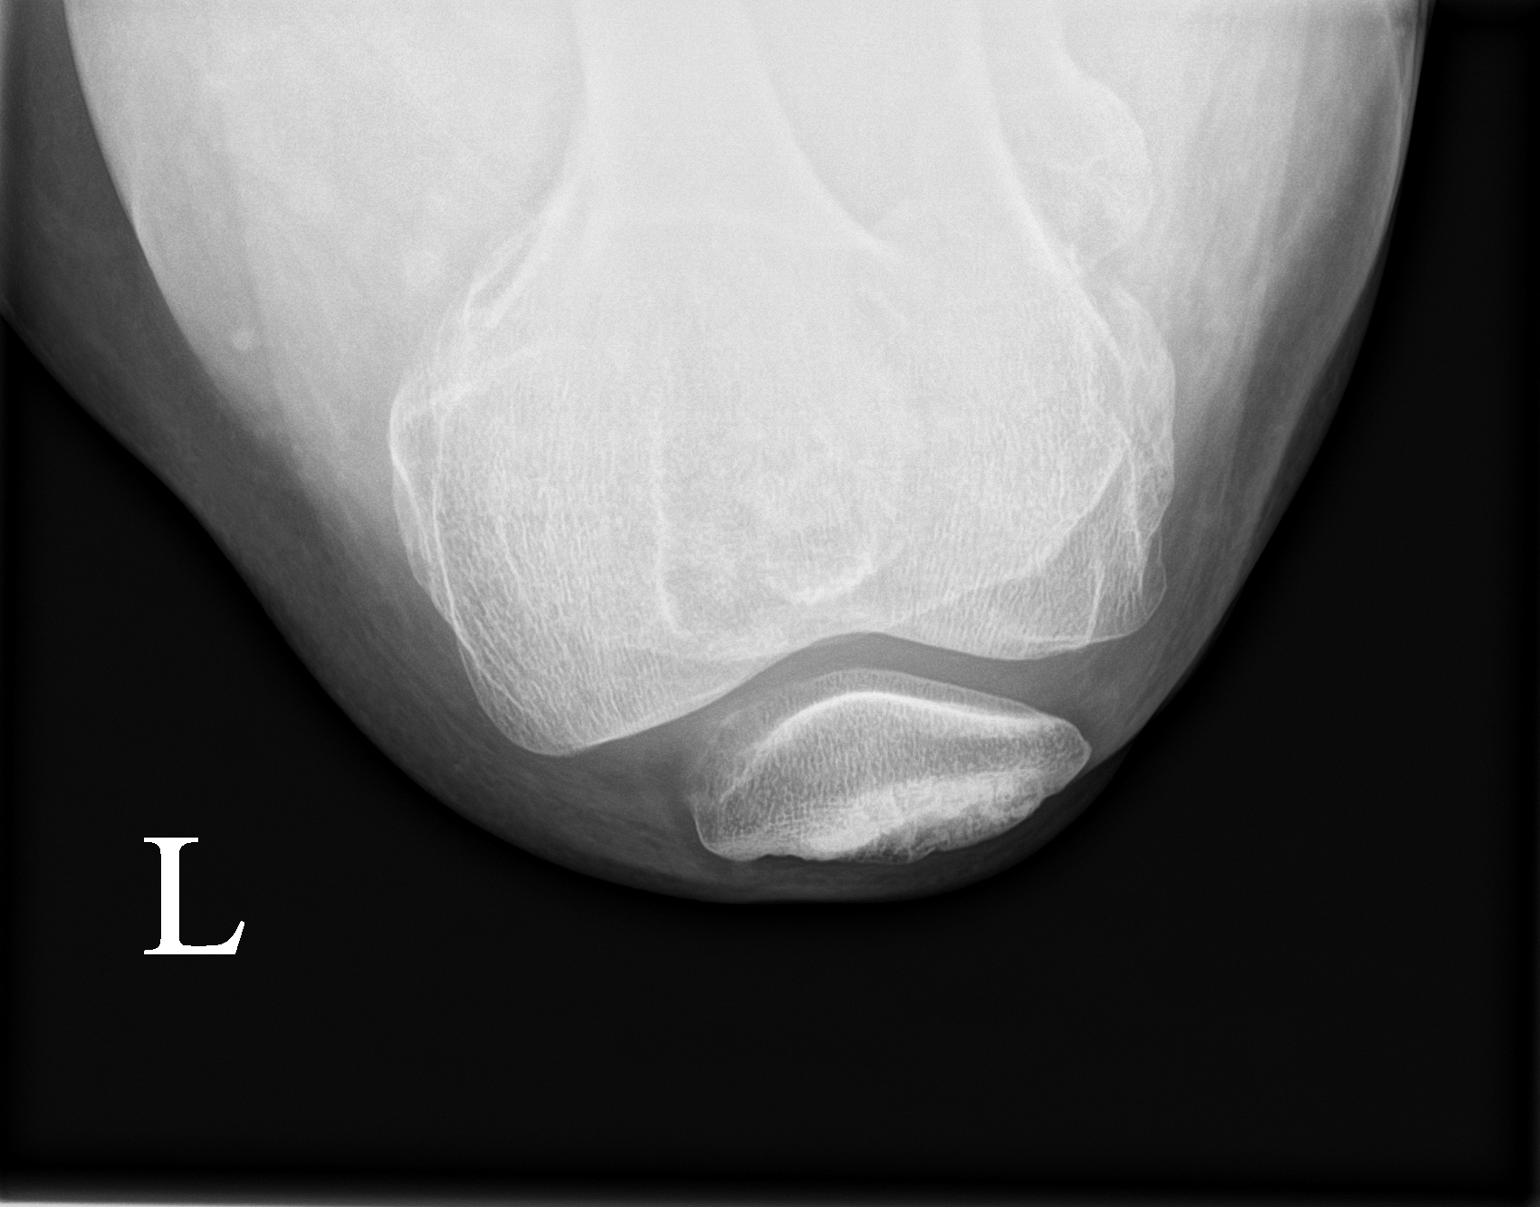

[3 of 3 positions shown; findings below may reference images not displayed]

FINDINGS: No evidence of fracture, dislocation, or joint effusion. No evidence
of arthropathy or other focal bone abnormality. Soft tissues are
unremarkable.
IMPRESSION: No fracture or dislocation of the left knee. Joint spaces are
preserved. No knee joint effusion.

## 2022-02-25 ENCOUNTER — Other Ambulatory Visit: Payer: Self-pay | Admitting: Family Medicine

## 2022-03-03 ENCOUNTER — Other Ambulatory Visit: Payer: Self-pay | Admitting: Family Medicine

## 2022-04-04 ENCOUNTER — Other Ambulatory Visit: Payer: Self-pay | Admitting: Family Medicine

## 2022-04-26 ENCOUNTER — Other Ambulatory Visit: Payer: Self-pay | Admitting: Family Medicine

## 2022-05-07 IMAGING — MG MM DIGITAL SCREENING BILAT W/ TOMO AND CAD
6 of 10 series · 6 of 30 positions shown · non-contrast
Comparison: Previous exam(s).

CLINICAL DATA: Screening.

EXAM:
DIGITAL SCREENING BILATERAL MAMMOGRAM WITH TOMOSYNTHESIS AND CAD
TECHNIQUE: Bilateral screening digital craniocaudal and mediolateral oblique
mammograms were obtained. Bilateral screening digital breast
tomosynthesis was performed. The images were evaluated with
computer-aided detection.

[L CC synth-2D]
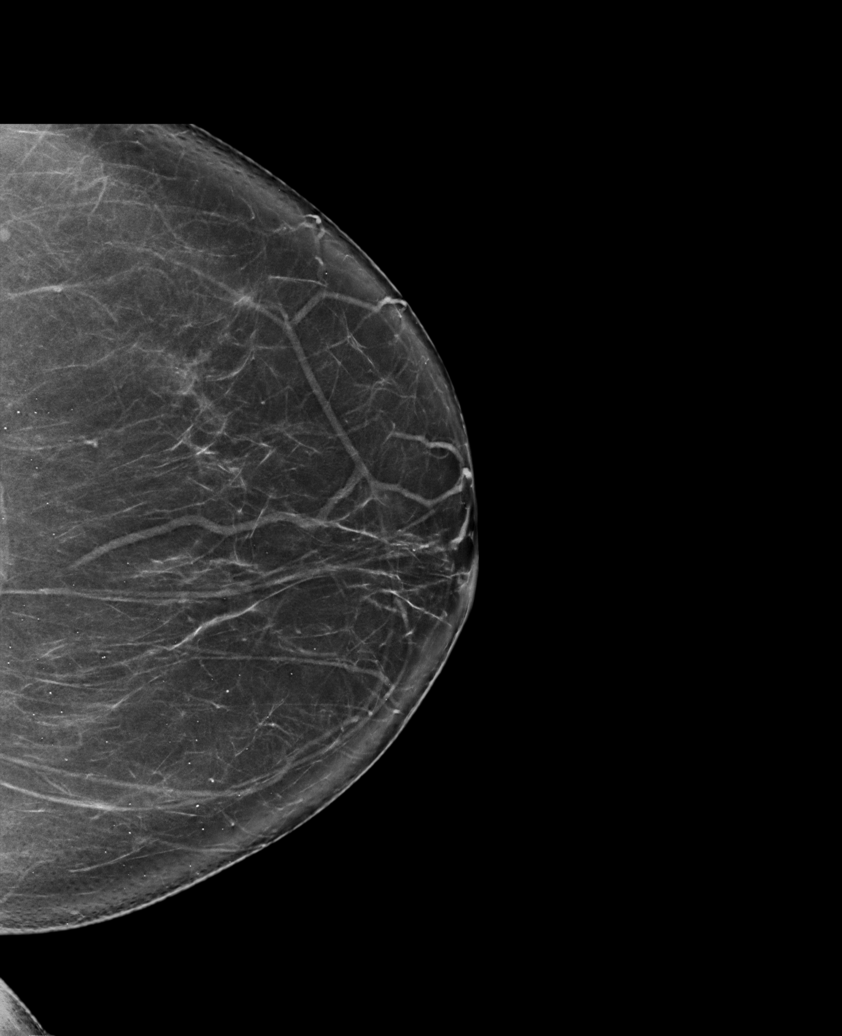

[R MLO synth-2D (1 of 2)]
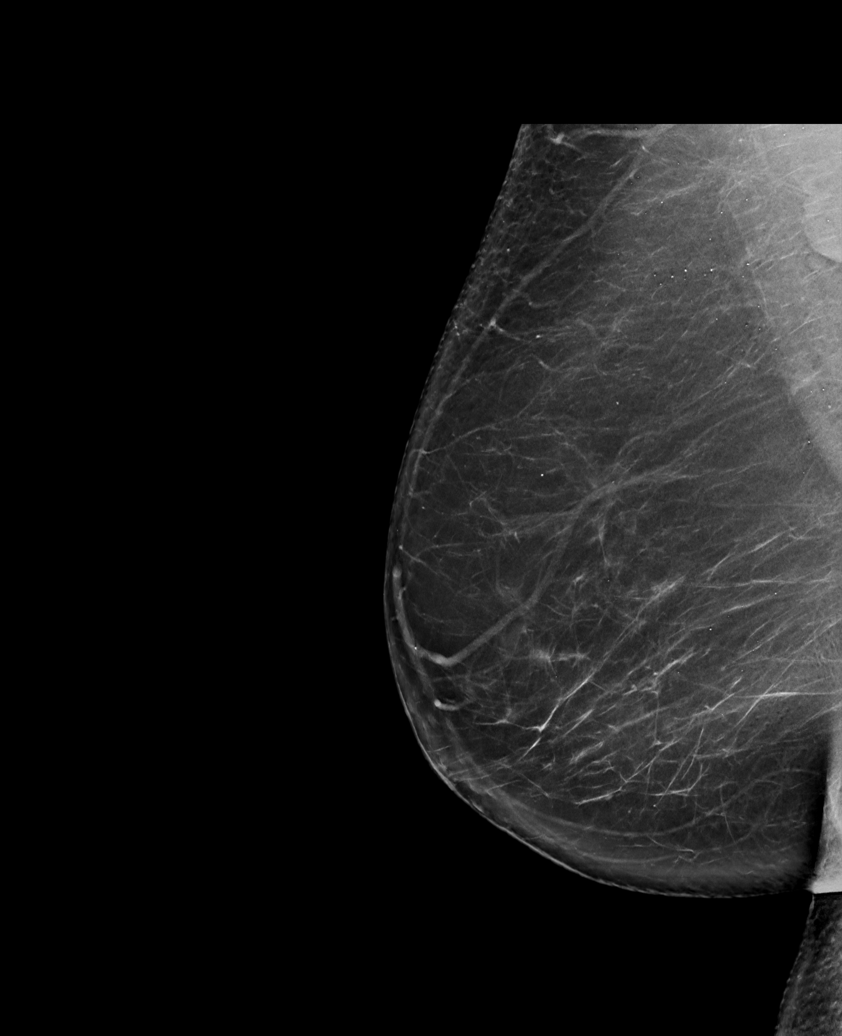

[R MLO synth-2D (2 of 2)]
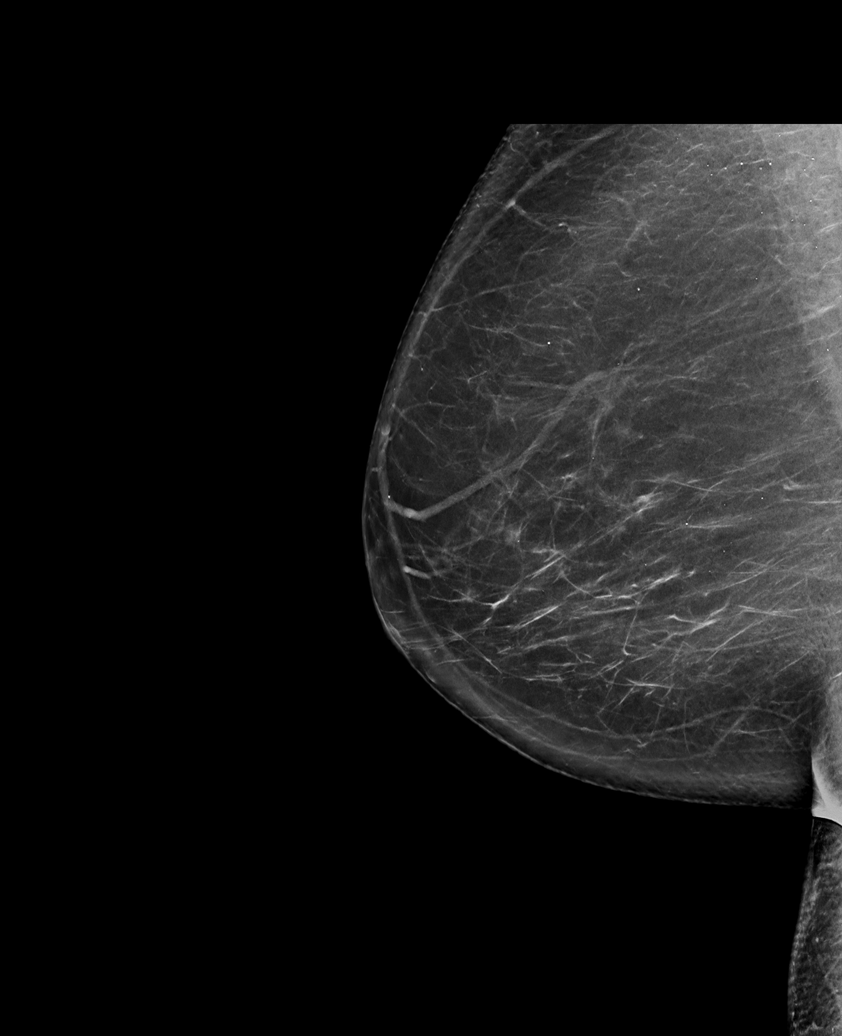

[L MLO synth-2D]
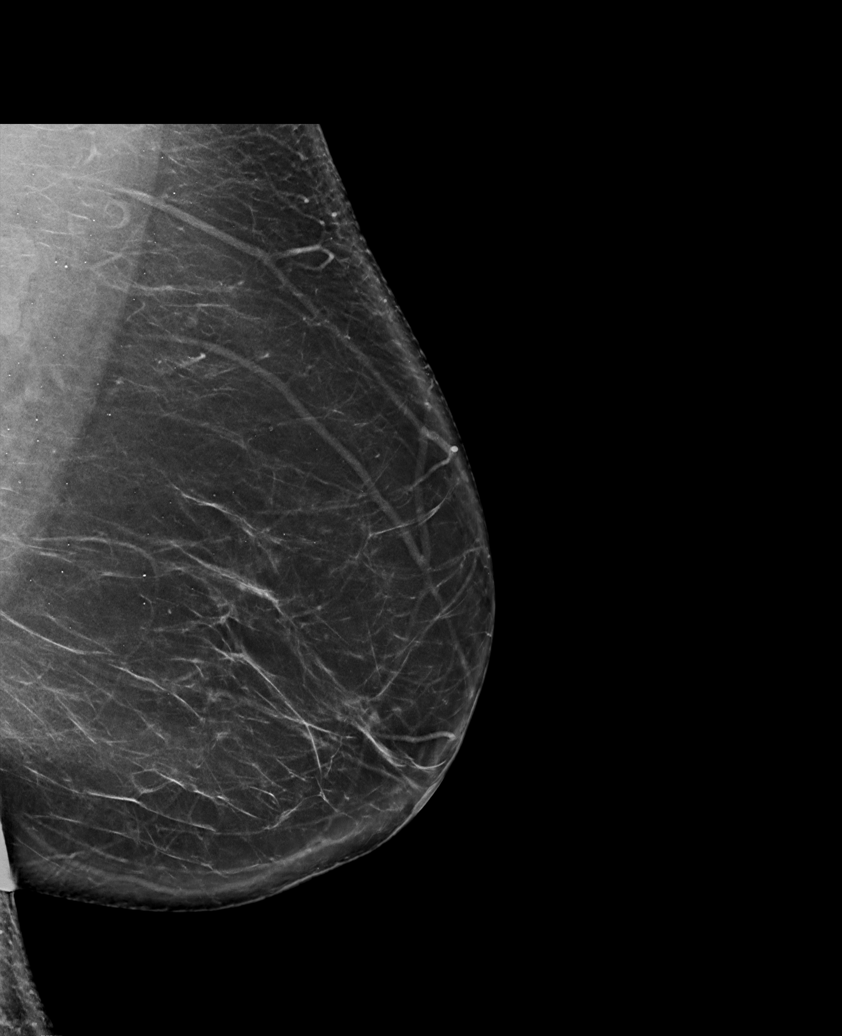

[R CC synth-2D]
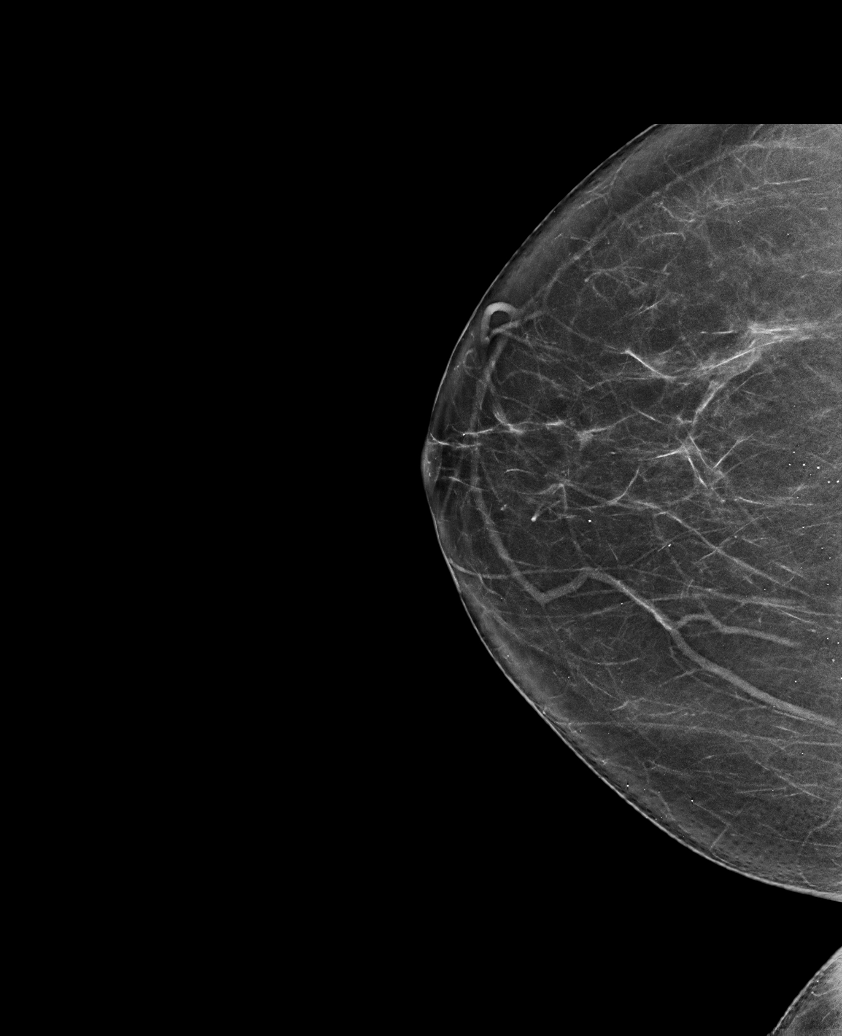

[R MLO tomo · tomo slice 47/93.0]
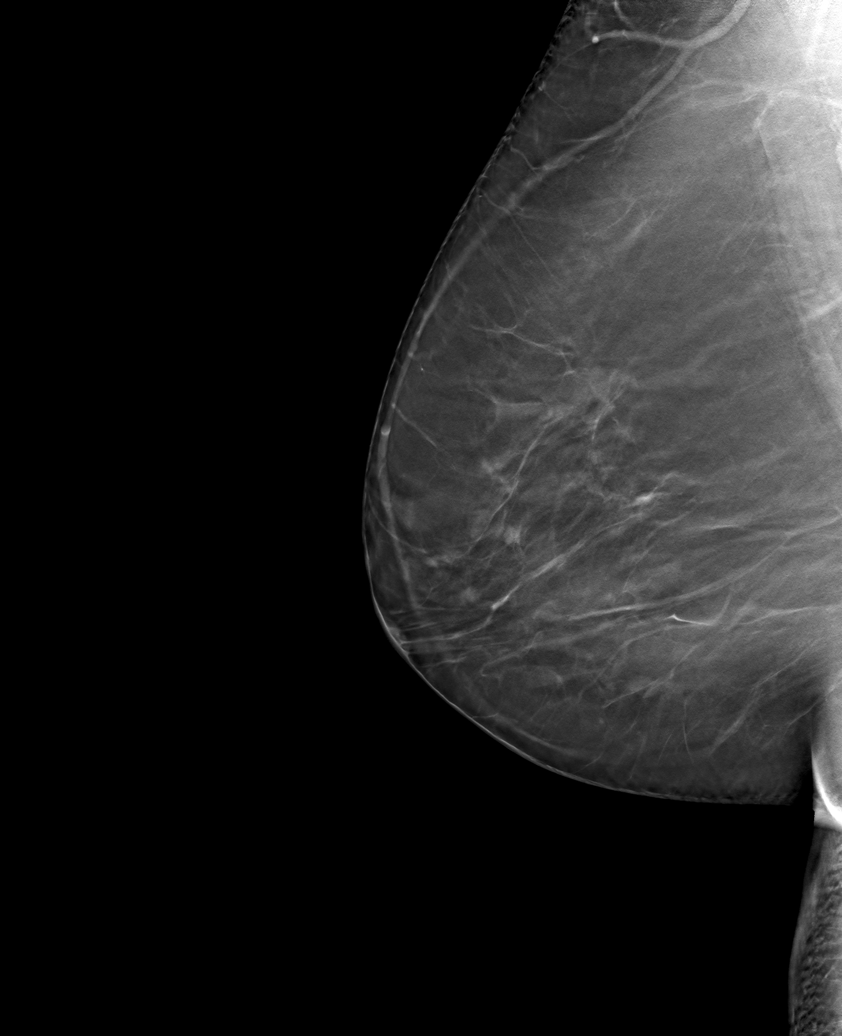

[6 of 30 positions shown; findings below may reference images not displayed]

ACR Breast Density Category b: There are scattered areas of
fibroglandular density.
FINDINGS: In the right breast, a possible mass warrants further evaluation. In
the left breast, no findings suspicious for malignancy.
IMPRESSION: Further evaluation is suggested for a possible mass in the right
breast.

RECOMMENDATION:
Diagnostic mammogram and possibly ultrasound of the right breast.
(Code:7X-E-WW6)

The patient will be contacted regarding the findings, and additional
imaging will be scheduled.

BI-RADS CATEGORY  0: Incomplete. Need additional imaging evaluation
and/or prior mammograms for comparison.

## 2022-05-24 ENCOUNTER — Other Ambulatory Visit: Payer: Self-pay | Admitting: Family Medicine

## 2022-06-08 ENCOUNTER — Other Ambulatory Visit: Payer: Self-pay | Admitting: Family Medicine

## 2022-06-11 MED ORDER — CYCLOBENZAPRINE HCL 10 MG PO TABS: 10.0000 mg | ORAL_TABLET | Freq: Every day | ORAL | 0 refills | Status: DC

## 2022-06-15 ENCOUNTER — Ambulatory Visit: Payer: BC Managed Care – PPO | Admitting: Family Medicine

## 2022-07-23 ENCOUNTER — Other Ambulatory Visit: Payer: Self-pay | Admitting: Family Medicine

## 2022-07-24 NOTE — Telephone Encounter (Signed)
Rx done. 

## 2022-08-07 ENCOUNTER — Encounter: Payer: Self-pay | Admitting: Family Medicine

## 2022-08-07 ENCOUNTER — Ambulatory Visit (INDEPENDENT_AMBULATORY_CARE_PROVIDER_SITE_OTHER): Payer: BC Managed Care – PPO | Admitting: Family Medicine

## 2022-08-07 VITALS — BP 132/80 | HR 84 | Temp 98.0°F | Ht 63.0 in | Wt 180.7 lb

## 2022-08-07 DIAGNOSIS — G4700 Insomnia, unspecified: Secondary | ICD-10-CM | POA: Diagnosis not present

## 2022-08-07 MED ORDER — ESZOPICLONE 2 MG PO TABS: 2.0000 mg | ORAL_TABLET | Freq: Every evening | ORAL | 0 refills | Status: DC | PRN

## 2022-08-07 NOTE — Progress Notes (Signed)
Established Patient Office Visit  Subjective   Patient ID: Natasha Fitzgerald, female    DOB: 12/10/1965  Age: 57 y.o. MRN: [id number]  Chief Complaint  Patient presents with   Insomnia    HPI   Natasha Fitzgerald is here to discuss insomnia issues.  Natasha Fitzgerald has had several years really of battling with sleep difficulties at night.  Natasha Fitzgerald has difficulty falling asleep and staying asleep.  Natasha Fitzgerald has tried melatonin without improvement.  Has tried Tylenol PM but this did not work effectively and Natasha Fitzgerald also had side effects from medication.  Natasha Fitzgerald has depression currently treated with Prozac 40 mg daily and this is working well.  Natasha Fitzgerald frequently cannot get to sleep until 1 to 3 AM.  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  No alcohol use at night.  Avoids late day use of caffeine.  No daytime napping.  Denies specific stressors.  Does sometimes look at her cell phone at night but generally avoids TV.  Past Medical History:  Diagnosis Date   Anxiety    Chronic headaches 09/28/2009   DEPRESSION 09/28/2009   Fibromyalgia    History of kidney stones    HYPERTENSION 09/28/2009   IRREGULAR MENSES 09/28/2009   RUQ pain    Thyroid disease    Past Surgical History:  Procedure Laterality Date   CESAREAN SECTION  1997   TONSILLECTOMY  1973    reports that Natasha Fitzgerald has never smoked. Natasha Fitzgerald has never used smokeless tobacco. Natasha Fitzgerald reports current alcohol use. Natasha Fitzgerald reports that Natasha Fitzgerald does not use drugs. family history includes Breast cancer in her maternal grandmother; Cancer in her paternal grandmother; Hashimoto's thyroiditis in her sister; Heart disease in her maternal grandfather and paternal grandfather; Hypertension in her mother. Allergies  Allergen Reactions   Doxycycline     "headache, irritability, upset stomach"   Penicillins     REACTION: hives    Review of Systems  Psychiatric/Behavioral:  Negative  for depression. The patient has insomnia. The patient is not nervous/anxious.       Objective:     BP 132/80 (BP Location: Left Arm, Patient Position: Sitting, Cuff Size: Normal)   Pulse 84   Temp 98 F (36.7 C) (Oral)   Ht 5\' 3"  (1.6 m)   Wt 180 lb 11.2 oz (82 kg)   LMP 12/26/2012   SpO2 98%   BMI 32.01 kg/m  BP Readings from Last 3 Encounters:  08/07/22 132/80  02/13/22 124/80  01/30/22 130/70   Wt Readings from Last 3 Encounters:  08/07/22 180 lb 11.2 oz (82 kg)  02/13/22 177 lb 6.4 oz (80.5 kg)  01/30/22 177 lb 14.4 oz (80.7 kg)      Physical Exam Vitals reviewed.  Constitutional:      Appearance: Normal appearance.  Cardiovascular:     Rate and Rhythm: Normal rate and regular rhythm.  Pulmonary:     Effort: Pulmonary effort is normal.     Breath sounds: Normal breath sounds.  Neurological:     General: No focal deficit present.     Mental Status: Natasha Fitzgerald is alert.     Cranial Nerves: No cranial nerve deficit.  Psychiatric:        Mood and Affect: Mood normal.      No results found for any visits on 08/07/22.  Last thyroid functions Lab Results  Component Value  Date   TSH 3.67 09/29/2021      The 10-year ASCVD risk score (Arnett DK, et al., 2019) is: 4%    Assessment & Plan:   Problem List Items Addressed This Visit   None Visit Diagnoses     Insomnia, unspecified type    -  Primary     Longstanding history of insomnia.  We spent quite some time discussing sleep hygiene.  Handout given.  Continue to avoid late day use of caffeine.  Natasha Fitzgerald does not use alcohol at night.  We did discuss trazodone but Natasha Fitzgerald is already on high-dose fluoxetine and increased risk of interaction.  No relief with over-the-counter medications.  We did discuss possible short-term use of medication such as Lunesta or Ambien.  We decided to start Lunesta 2 mg nightly as needed.  Avoid bright lights as much as possible at night  No follow-ups on file.    Evelena Peat,  MD

## 2022-08-20 ENCOUNTER — Other Ambulatory Visit: Payer: Self-pay | Admitting: Family Medicine

## 2022-09-04 ENCOUNTER — Other Ambulatory Visit: Payer: Self-pay | Admitting: Family Medicine

## 2022-09-06 MED ORDER — ESZOPICLONE 2 MG PO TABS: 2.0000 mg | ORAL_TABLET | Freq: Every evening | ORAL | 1 refills | Status: DC | PRN

## 2022-09-06 NOTE — Telephone Encounter (Signed)
Refilled Lunesta, but would encourage to try to avoid chronic nightly use, if possible  Natasha Covey MD [facility]

## 2022-09-28 ENCOUNTER — Ambulatory Visit: Payer: BC Managed Care – PPO | Admitting: Family Medicine

## 2022-09-28 ENCOUNTER — Other Ambulatory Visit: Payer: Self-pay

## 2022-09-28 VITALS — BP 118/84 | HR 87 | Ht 63.0 in | Wt 181.0 lb

## 2022-09-28 DIAGNOSIS — M76822 Posterior tibial tendinitis, left leg: Secondary | ICD-10-CM | POA: Diagnosis not present

## 2022-09-28 NOTE — Patient Instructions (Addendum)
Thank you for coming in today.   Call or go to the ER if you develop a large red swollen joint with extreme pain or oozing puss.    Use the cam walker boot for a few weeks.   If this is not effective next step may be MRI   Continue home exercises.

## 2022-09-28 NOTE — Progress Notes (Signed)
Rubin Payor, PhD, LAT, ATC acting as a scribe for Clementeen Graham, MD.  Natasha Fitzgerald is a 57 y.o. female who presents to [facility] at [facility] today for re-occurring L ankle pain. Pt was last seen by Dr. 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 She had a L tibialis posterior tendon steroid injection on 10/05/21.  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 She had gotten new shoes and insert, but the pain is still nagging. She notes benefit from prior PT as oppo sed to trying to do the exercises on her own.   Dx imaging: 07/02/19 L foot XR   Pertinent review of systems: No fevers or chills  Relevant historical information: Hypertension   Exam:  BP 118/84   Pulse 87   Ht 5\' 3"  (1.6 m)   Wt 181 lb (82.1 kg)   LMP 12/26/2012   SpO2 96%   BMI 32.06 kg/m  General: Well Developed, well nourished, and in no acute distress.   MSK: Left ankle normal. Tender palpation along the course of the posterior tibialis tendon.  Normal foot and ankle motion.  Pain with resisted foot inversion.    Lab and Radiology Results  Procedure: Real-time Ultrasound Guided Injection of left posterior tibialis tendon sheath Device: Philips Affiniti 50G/GE Logiq Images permanently stored and available for review in PACS Verbal informed consent obtained.  Discussed risks and benefits of procedure. Warned about infection, bleeding, hyperglycemia damage to structures among others. Patient expresses understanding and agreement Time-out conducted.   Noted no overlying erythema, induration, or other signs of local infection.   Skin prepped in a sterile fashion.   Local anesthesia: Topical Ethyl chloride.   With sterile technique and under real time ultrasound guidance: 40 mg of Kenalog and 1 mL of lidocaine injected into tendon sheath around the posterior tibialis tendon. Fluid seen entering  the tendon sheath.   Completed without difficulty   Pain immediately resolved suggesting accurate placement of the medication.   Advised to call if fevers/chills, erythema, induration, drainage, or persistent bleeding.   Images permanently stored and available for review in the ultrasound unit.  Impression: Technically successful ultrasound guided injection.         Assessment and Plan: 57 y.o. female with left foot and ankle pain.  This is an acute exacerbation of a chronic problem.  She was seen almost a year ago for this same issue and ultimately had a steroid injection which worked very well.  Since her pain has been slowly creeping back she has been independently doing home exercise program and tried other conservative management strategies such as compression sleeve and Voltaren gel.  She is interested in a repeat injection today which I think is reasonable.  Talked about the risk of tendon rupture following steroid injection of this tendon sheath.  Recommend using a cam walker boot for few weeks to protect the tendon.  If not improved next step should probably be an MRI.   PDMP not reviewed this encounter. Orders Placed This Encounter  Procedures   [place] LIMITED JOINT SPACE STRUCTURES LOW LEFT(NO LINKED CHARGES)    Order Specific Question:   Reason for Exam (SYMPTOM  OR DIAGNOSIS REQUIRED)    Answer:   left ankle pain    Order Specific Question:   Preferred imaging location?    Answer:   Adult nurse [facility]  No orders of the defined types were placed in this encounter.    Discussed warning signs or symptoms. Please see discharge instructions. Patient expresses understanding.   The above documentation has been reviewed and is accurate and complete Clementeen Graham, M.D.

## 2022-10-15 ENCOUNTER — Other Ambulatory Visit: Payer: Self-pay | Admitting: Family Medicine

## 2022-10-23 ENCOUNTER — Encounter: Payer: Self-pay | Admitting: Family Medicine

## 2022-10-23 ENCOUNTER — Ambulatory Visit: Payer: BC Managed Care – PPO | Admitting: Family Medicine

## 2022-10-23 VITALS — BP 132/84 | HR 90 | Temp 98.2°F | Ht 63.0 in | Wt 181.4 lb

## 2022-10-23 DIAGNOSIS — F419 Anxiety disorder, unspecified: Secondary | ICD-10-CM

## 2022-10-23 DIAGNOSIS — E038 Other specified hypothyroidism: Secondary | ICD-10-CM

## 2022-10-23 DIAGNOSIS — Z23 Encounter for immunization: Secondary | ICD-10-CM

## 2022-10-23 DIAGNOSIS — I10 Essential (primary) hypertension: Secondary | ICD-10-CM | POA: Diagnosis not present
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
## 2022-10-24 ENCOUNTER — Other Ambulatory Visit: Payer: Self-pay | Admitting: Family Medicine

## 2022-10-24 LAB — BASIC METABOLIC PANEL
BUN: 12 mg/dL (ref 6–23)
CO2: 28 meq/L (ref 19–32)
Calcium: 9.8 mg/dL (ref 8.4–10.5)
Chloride: 102 meq/L (ref 96–112)
Creatinine, Ser: 0.77 mg/dL (ref 0.40–1.20)
GFR: 85.31 mL/min (ref >60.00)
Glucose, Bld: 71 mg/dL (ref 70–99)
Potassium: 4.1 meq/L (ref 3.5–5.1)
Sodium: 139 meq/L (ref 135–145)

## 2022-10-24 LAB — TSH: TSH: 3.27 u[IU]/mL (ref 0.35–5.50)

## 2022-11-14 ENCOUNTER — Other Ambulatory Visit: Payer: Self-pay | Admitting: Family Medicine

## 2022-11-14 DIAGNOSIS — Z1231 Encounter for screening mammogram for malignant neoplasm of breast: Secondary | ICD-10-CM

## 2022-11-16 ENCOUNTER — Other Ambulatory Visit: Payer: Self-pay | Admitting: Family Medicine

## 2022-11-20 ENCOUNTER — Other Ambulatory Visit: Payer: Self-pay

## 2022-11-26 ENCOUNTER — Other Ambulatory Visit: Payer: Self-pay | Admitting: Family Medicine

## 2022-12-10 ENCOUNTER — Ambulatory Visit: Payer: BC Managed Care – PPO | Admitting: Family Medicine

## 2022-12-10 ENCOUNTER — Encounter: Payer: Self-pay | Admitting: Family Medicine

## 2022-12-10 ENCOUNTER — Ambulatory Visit (INDEPENDENT_AMBULATORY_CARE_PROVIDER_SITE_OTHER): Payer: BC Managed Care – PPO

## 2022-12-10 VITALS — BP 126/82 | HR 94 | Ht 63.0 in | Wt 187.0 lb

## 2022-12-10 DIAGNOSIS — M25532 Pain in left wrist: Secondary | ICD-10-CM | POA: Diagnosis not present

## 2022-12-10 DIAGNOSIS — K219 Gastro-esophageal reflux disease without esophagitis: Secondary | ICD-10-CM | POA: Diagnosis not present

## 2022-12-10 DIAGNOSIS — M25572 Pain in left ankle and joints of left foot: Secondary | ICD-10-CM

## 2022-12-10 DIAGNOSIS — G8929 Other chronic pain: Secondary | ICD-10-CM

## 2022-12-10 DIAGNOSIS — I10 Essential (primary) hypertension: Secondary | ICD-10-CM | POA: Diagnosis not present

## 2022-12-10 MED ORDER — MELOXICAM 15 MG PO TABS: ORAL_TABLET | ORAL | 3 refills | Status: AC

## 2022-12-10 NOTE — Patient Instructions (Addendum)
Thank you for coming in today.   Take the meloxicam daily as needed.   Please use Voltaren gel (Generic Diclofenac Gel) up to 4x daily for pain as needed.  This is available over-the-counter as both the name brand Voltaren gel and the generic diclofenac gel.   Ok to use both with tylenol.   Recheck if not improving.

## 2022-12-10 NOTE — Progress Notes (Unsigned)
I, Stevenson Clinch, CMA acting as a scribe for Clementeen Graham, MD.  Natasha Fitzgerald is a 57 y.o. female who presents to [facility] at [facility] today for ankle, wrist and back pain. Pt was last seen by Dr. Denyse Amass on 09/28/22 for L ankle pain.  Today, pt suffered a fall a couple of weeks ago, tripped over something while walking dogs. . Pt locates pain to the left ankle, right side upper to lower back, left knee, and left wrist. Swelling and bruising in the ankle and wrist at time of injury. Notes some weakness and cramping in the hands. Has tried modifying activities at home, had to take a couple of days off of work d/t back pain. Has tried muscle relaxer, heat, and Aleve with minimal relief. Locates ankle pain to medical aspect of the ankle, but this feels different to prior pain.   Treatments tried: as above  Pertinent review of systems: No fevers or chills  Relevant historical information: Chronic left ankle pain.   Exam:  BP 126/82   Pulse 94   Ht 5\' 3"  (1.6 m)   Wt 187 lb (84.8 kg)   LMP 12/26/2012   SpO2 98%   BMI 33.13 kg/m  General: Well Developed, well nourished, and in no acute distress.   MSK: Left wrist: Normal appearing. Mildly tender palpation dorsal wrist over radial styloid.  Nontender palpation at the anatomical snuffbox.  Normal wrist motion.  Left ankle normal appearing Tender palpation medial ankle.  Stable ligamentous exam and intact strength.  Knees bilaterally normal-appearing normal motion.    Lab and Radiology Results  X-ray images left wrist and left ankle obtained today personally and independently interpreted.  Left wrist: No acute fractures.  Minimal degenerative changes.  Left ankle: Rounded bone fragment just distal to the medial malleolus could represent an old fracture or an accessory ossicle.  No acute fractures are visible.  Minimal degenerative changes are present.  Await formal radiology review.    Assessment  and Plan: 57 y.o. female with fall with an acute exacerbation of chronic left ankle pain.  Ankle pain is a chronic ongoing issue.  Plan to resume compression sleeve Voltaren gel and will add meloxicam.  If not improving consider MRI or injection.  Left wrist pain acute injury after a fall due to wrist strain.  Fracture not visible on x-ray and patient is not particularly tender to palpation in the anatomical snuffbox.  Plan for meloxicam a bit of watchful waiting.  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  Again meloxicam and Voltaren gel.  We talked about safety of meloxicam with hypertension and GERD.  Short duration should be okay but long-term is not safe.  PDMP not reviewed this encounter. Orders Placed This Encounter  Procedures   DG Wrist Complete Left    Standing Status:   Future    Number of Occurrences:   1    Standing Expiration Date:   01/10/2023    Order Specific Question:   Reason for Exam (SYMPTOM  OR DIAGNOSIS REQUIRED)    Answer:   left wrist pain    Order Specific Question:   Preferred imaging location?    Answer:   [facility]    Order Specific Question:   Is patient pregnant?    Answer:   No   DG Ankle Complete Left    Standing Status:   Future    Number of Occurrences:   1  Standing Expiration Date:   12/10/2023    Order Specific Question:   Reason for Exam (SYMPTOM  OR DIAGNOSIS REQUIRED)    Answer:   left ankle pain    Order Specific Question:   Preferred imaging location?    Answer:   [facility]    Order Specific Question:   Is patient pregnant?    Answer:   No   Meds ordered this encounter  Medications   meloxicam (MOBIC) 15 MG tablet    Sig: One tab PO qAM with breakfast for 2 weeks, then daily prn pain.    Dispense:  30 tablet    Refill:  3     Discussed warning signs or symptoms. Please see discharge instructions. Patient expresses understanding.   The above documentation has been reviewed and is accurate and  complete Clementeen Graham, M.D.

## 2022-12-11 ENCOUNTER — Other Ambulatory Visit: Payer: Self-pay | Admitting: Family Medicine

## 2022-12-11 NOTE — Progress Notes (Signed)
Left wrist x-ray shows a little bit of arthritis changes.  No broken bones.

## 2022-12-11 NOTE — Progress Notes (Signed)
Left ankle x-ray shows heel spur and some midfoot arthritis.  The radiologist did not comment on that little bony fragment.

## 2022-12-14 ENCOUNTER — Ambulatory Visit
Admission: RE | Admit: 2022-12-14 | Discharge: 2022-12-14 | Disposition: A | Discharge status: Home / Self Care | Payer: BC Managed Care – PPO | Source: Ambulatory Visit | Attending: Family Medicine | Admitting: Family Medicine

## 2022-12-14 DIAGNOSIS — Z1231 Encounter for screening mammogram for malignant neoplasm of breast: Secondary | ICD-10-CM

## 2022-12-16 ENCOUNTER — Other Ambulatory Visit: Payer: Self-pay | Admitting: Family Medicine

## 2022-12-27 ENCOUNTER — Ambulatory Visit
Admission: RE | Admit: 2022-12-27 | Discharge: 2022-12-27 | Disposition: A | Discharge status: Home / Self Care | Payer: BC Managed Care – PPO | Source: Ambulatory Visit | Attending: Family Medicine | Admitting: Family Medicine

## 2022-12-27 VITALS — BP 132/88 | HR 98 | Temp 99.4°F | Resp 18

## 2022-12-27 DIAGNOSIS — J40 Bronchitis, not specified as acute or chronic: Secondary | ICD-10-CM

## 2022-12-27 DIAGNOSIS — J329 Chronic sinusitis, unspecified: Secondary | ICD-10-CM | POA: Diagnosis not present

## 2022-12-27 MED ORDER — ALBUTEROL SULFATE HFA 108 (90 BASE) MCG/ACT IN AERS: 1.0000 | INHALATION_SPRAY | RESPIRATORY_TRACT | 0 refills | Status: AC | PRN

## 2022-12-27 MED ORDER — PREDNISONE 20 MG PO TABS: ORAL_TABLET | ORAL | 0 refills | Status: DC

## 2022-12-27 MED ORDER — CETIRIZINE HCL 10 MG PO TABS: 10.0000 mg | ORAL_TABLET | Freq: Every day | ORAL | 0 refills | Status: AC

## 2022-12-27 NOTE — ED Triage Notes (Signed)
Pt reports cough and ear fullness x 5 days. OTC decongestant, Mucinex gives no relief.

## 2022-12-27 NOTE — ED Provider Notes (Signed)
[facility]  Note:  This document was prepared using Conservation officer, historic buildings and may include unintentional dictation errors.  MRN: [id number] DOB: 03-07-1965  Subjective:   Natasha Fitzgerald is a 57 y.o. female presenting for 5-day history of acute onset persistent coughing, ear fullness, ear pain. Taking a deep breath elicits coughing and shortness of breath thereafter. Has a history of bronchitis. No asthma. No smoking of any kind including cigarettes, cigars, vaping, marijuana use.    No current facility-administered medications for this encounter.  Current Outpatient Medications:    dextromethorphan-guaiFENesin (MUCINEX DM) 30-600 MG 12hr tablet, Take 1 tablet by mouth 2 (two) times daily., Disp: , Rfl:    acetaminophen (TYLENOL) 500 MG tablet, Take 500 mg by mouth every 6 (six) hours as needed., Disp: , Rfl:    albuterol (VENTOLIN HFA) 108 (90 Base) MCG/ACT inhaler, INHALE 1-2 PUFFS BY MOUTH EVERY 6 HOURS AS NEEDED FOR WHEEZE OR SHORTNESS OF BREATH, Disp: 18 each, Rfl: 0   diclofenac Sodium (VOLTAREN) 1 % GEL, Apply 2 g topically 4 (four) times daily., Disp: 100 g, Rfl: 1   eletriptan (RELPAX) 40 MG tablet, TAKE 1 TABLET AS NEEDED FOR MIGRAINE, HEADACHE. MAY REPEAT IN 2 HOURS IF PERSISTS OR RECURS., Disp: 10 tablet, Rfl: 0   eszopiclone (LUNESTA) 2 MG TABS tablet, TAKE 1 TABLET (2 MG TOTAL) BY MOUTH IMMEDIATELY BEFORE BEDTIME AS NEEDED FOR SLEEP, Disp: 30 tablet, Rfl: 2   FLUoxetine (PROZAC) 40 MG capsule, TAKE 1 CAPSULE (40 MG TOTAL) BY MOUTH DAILY., Disp: 90 capsule, Rfl: 0   ipratropium (ATROVENT) 0.03 % nasal spray, PLACE 2 SPRAYS INTO BOTH NOSTRILS EVERY 12 HOURS., Disp: 90 mL, Rfl: 1   levothyroxine (SYNTHROID) 88 MCG tablet, TAKE 1 TABLET BY MOUTH EVERY DAY BEFORE BREAKFAST, Disp: 90 tablet, Rfl: 1   losartan (COZAAR) 25 MG tablet, TAKE 2 TABLETS BY MOUTH EVERY DAY, Disp: 180 tablet, Rfl: 1   meloxicam (MOBIC) 15 MG tablet, One tab PO qAM  with breakfast for 2 weeks, then daily prn pain., Disp: 30 tablet, Rfl: 3   phenazopyridine (PYRIDIUM) 200 MG tablet, Take 200 mg by mouth 3 (three) times daily as needed for pain., Disp: , Rfl:    Allergies  Allergen Reactions   Doxycycline     "headache, irritability, upset stomach"   Penicillins     REACTION: hives    Past Medical History:  Diagnosis Date   Anxiety    Chronic headaches 09/28/2009   DEPRESSION 09/28/2009   Fibromyalgia    History of kidney stones    HYPERTENSION 09/28/2009   IRREGULAR MENSES 09/28/2009   RUQ pain    Thyroid disease      Past Surgical History:  Procedure Laterality Date   CESAREAN SECTION  1997   TONSILLECTOMY  1973    Family History  Problem Relation Age of Onset   Hypertension Mother    Hashimoto's thyroiditis Sister    Breast cancer Maternal Grandmother    Heart disease Maternal Grandfather    Cancer Paternal Grandmother        breast cancer   Heart disease Paternal Grandfather    Colon cancer Neg Hx    Colon polyps Neg Hx    Esophageal cancer Neg Hx    Rectal cancer Neg Hx    Stomach cancer Neg Hx     Social History   Tobacco Use   Smoking status: Never   Smokeless tobacco: Never  Vaping Use  Vaping status: Never Used  Substance Use Topics   Alcohol use: Yes    Comment: occ   Drug use: No    ROS   Objective:   Vitals: BP 132/88 (BP Location: Right Arm)   Pulse 98   Temp 99.4 F (37.4 C) (Oral)   Resp 18   LMP 12/26/2012   SpO2 96%   Physical Exam Constitutional:      General: She is not in acute distress.    Appearance: Normal appearance. She is well-developed and normal weight. She is not ill-appearing, toxic-appearing or diaphoretic.  HENT:     Head: Normocephalic and atraumatic.     Right Ear: Tympanic membrane, ear canal and external ear normal. No drainage or tenderness. No middle ear effusion. There is no impacted cerumen. Tympanic membrane is not erythematous or bulging.     Left Ear: Tympanic  membrane, ear canal and external ear normal. No drainage or tenderness.  No middle ear effusion. There is no impacted cerumen. Tympanic membrane is not erythematous or bulging.     Nose: Nose normal. No congestion or rhinorrhea.     Mouth/Throat:     Mouth: Mucous membranes are moist. No oral lesions.     Pharynx: No pharyngeal swelling, oropharyngeal exudate, posterior oropharyngeal erythema or uvula swelling.     Tonsils: No tonsillar exudate or tonsillar abscesses.  Eyes:     General: No scleral icterus.       Right eye: No discharge.        Left eye: No discharge.     Extraocular Movements: Extraocular movements intact.     Right eye: Normal extraocular motion.     Left eye: Normal extraocular motion.     Conjunctiva/sclera: Conjunctivae normal.  Cardiovascular:     Rate and Rhythm: Normal rate and regular rhythm.     Heart sounds: Normal heart sounds. No murmur heard.    No friction rub. No gallop.  Pulmonary:     Effort: Pulmonary effort is normal. No respiratory distress.     Breath sounds: No stridor. No wheezing, rhonchi or rales.  Chest:     Chest wall: No tenderness.  Musculoskeletal:     Cervical back: Normal range of motion and neck supple.  Lymphadenopathy:     Cervical: No cervical adenopathy.  Skin:    General: Skin is warm and dry.  Neurological:     General: No focal deficit present.     Mental Status: She is alert and oriented to person, place, and time.  Psychiatric:        Mood and Affect: Mood normal.        Behavior: Behavior normal.     Assessment and Plan :   PDMP not reviewed this encounter.  1. Sinobronchitis    Recommended managing for viral sinobronchitis with prednisone, Zyrtec and albuterol.  Use supportive care otherwise.  Deferred imaging given clear cardiopulmonary exam, hemodynamically stable vital signs.  Counseled patient on potential for adverse effects with medications prescribed/recommended today, ER and return-to-clinic precautions  discussed, patient verbalized understanding.    Wallis Bamberg, [place] 12/27/22 [phone]

## 2022-12-31 ENCOUNTER — Ambulatory Visit: Payer: BC Managed Care – PPO | Admitting: Family Medicine

## 2022-12-31 ENCOUNTER — Encounter: Payer: Self-pay | Admitting: Family Medicine

## 2022-12-31 VITALS — BP 124/74 | HR 110 | Temp 98.5°F | Wt 181.9 lb

## 2022-12-31 DIAGNOSIS — J069 Acute upper respiratory infection, unspecified: Secondary | ICD-10-CM | POA: Diagnosis not present

## 2022-12-31 MED ORDER — HYDROCODONE BIT-HOMATROP MBR 5-1.5 MG/5ML PO SOLN: 5.0000 mL | Freq: Four times a day (QID) | ORAL | 0 refills | Status: DC | PRN

## 2022-12-31 NOTE — Progress Notes (Signed)
Established Patient Office Visit  Subjective   Patient ID: Natasha Fitzgerald, female    DOB: 1965/08/08  Age: 57 y.o. MRN: [id number]  Chief Complaint  Patient presents with   Ear Pain   Nasal Congestion    Patient complains of nasal congestion, x1.5 weeks     HPI   Natasha Fitzgerald is seen with some nasal congestion, hoarseness, cough, and intermittent ear pain mostly right ear over the past week and a half.  She went to urgent care on the 26th felt to have viral process.  Lung exam was clear.  She was prescribed prednisone 20 mg daily and has 1 more day of that.  Also prescribed albuterol which she uses occasionally.  Severe cough at times at night.  Not relieved with over-the-counter medications.  Has tried Tessalon in the past without much success.  No known sick contacts.  Denies any headaches or facial pain.  No purulent secretions.  Non-smoker.  No chronic lung issues.  Overall, she does feel some better compared with last week.  Her hoarseness has improved slightly  Past Medical History:  Diagnosis Date   Anxiety    Chronic headaches 09/28/2009   DEPRESSION 09/28/2009   Fibromyalgia    History of kidney stones    HYPERTENSION 09/28/2009   IRREGULAR MENSES 09/28/2009   RUQ pain    Thyroid disease    Past Surgical History:  Procedure Laterality Date   CESAREAN SECTION  1997   TONSILLECTOMY  1973    reports that she has never smoked. She has never used smokeless tobacco. She reports current alcohol use. She reports that she does not use drugs. family history includes Breast cancer in her maternal grandmother; Cancer in her paternal grandmother; Hashimoto's thyroiditis in her sister; Heart disease in her maternal grandfather and paternal grandfather; Hypertension in her mother. Allergies  Allergen Reactions   Doxycycline     "headache, irritability, upset stomach"   Penicillins     REACTION: hives    Review of Systems  Constitutional:  Negative for chills and fever.  HENT:   Positive for congestion and ear pain. Negative for hearing loss and sinus pain.   Respiratory:  Positive for cough. Negative for hemoptysis and shortness of breath.       Objective:     BP 124/74 (BP Location: Left Arm, Patient Position: Sitting, Cuff Size: Normal)   Pulse (!) 110   Temp 98.5 F (36.9 C) (Oral)   Wt 181 lb 14.4 oz (82.5 kg)   LMP 12/26/2012   SpO2 95%   BMI 32.22 kg/m  BP Readings from Last 3 Encounters:  12/31/22 124/74  12/27/22 132/88  12/10/22 126/82   Wt Readings from Last 3 Encounters:  12/31/22 181 lb 14.4 oz (82.5 kg)  12/10/22 187 lb (84.8 kg)  10/23/22 181 lb 6.4 oz (82.3 kg)      Physical Exam Vitals reviewed.  Constitutional:      General: She is not in acute distress.    Appearance: She is not ill-appearing.  HENT:     Right Ear: Tympanic membrane normal.     Left Ear: Tympanic membrane normal.     Mouth/Throat:     Mouth: Mucous membranes are moist.     Pharynx: Oropharynx is clear. No oropharyngeal exudate or posterior oropharyngeal erythema.  Cardiovascular:     Rate and Rhythm: Normal rate and regular rhythm.  Pulmonary:     Effort: Pulmonary effort is normal.     Breath sounds:  Normal breath sounds. No wheezing or rales.  Musculoskeletal:     Cervical back: Neck supple.  Lymphadenopathy:     Cervical: No cervical adenopathy.  Neurological:     Mental Status: She is alert.      No results found for any visits on 12/31/22.    The 10-year ASCVD risk score (Arnett DK, et al., 2019) is: 3.9%    Assessment & Plan:   Probable acute viral URI with cough.  Nonfocal lung exam.  No wheezing noted at this time.  No rales.  No evidence for acute bacterial sinusitis at this time  -Continue to sip warm liquids and rest voice is much as possible for laryngitis symptoms -We did agree to send in limited Hycodan cough syrup 1 teaspoon nightly for severe cough.  She has tried Tessalon in the past without much success -Follow-up  immediately for any fever or increased shortness of breath  Evelena Peat, MD

## 2022-12-31 NOTE — Patient Instructions (Signed)
Follow up for any fever or increased shortness of breath. 

## 2023-01-09 ENCOUNTER — Ambulatory Visit: Payer: Self-pay | Admitting: Family Medicine

## 2023-01-15 ENCOUNTER — Other Ambulatory Visit: Payer: Self-pay | Admitting: Family Medicine

## 2023-01-31 ENCOUNTER — Encounter (HOSPITAL_BASED_OUTPATIENT_CLINIC_OR_DEPARTMENT_OTHER): Payer: Self-pay | Admitting: Emergency Medicine

## 2023-01-31 ENCOUNTER — Emergency Department (HOSPITAL_BASED_OUTPATIENT_CLINIC_OR_DEPARTMENT_OTHER)
Admission: EM | Admit: 2023-01-31 | Discharge: 2023-01-31 | Disposition: A | Discharge status: Home / Self Care | Payer: 59 | Attending: Emergency Medicine | Admitting: Emergency Medicine

## 2023-01-31 ENCOUNTER — Emergency Department (HOSPITAL_BASED_OUTPATIENT_CLINIC_OR_DEPARTMENT_OTHER): Payer: 59

## 2023-01-31 DIAGNOSIS — R1031 Right lower quadrant pain: Secondary | ICD-10-CM | POA: Diagnosis present

## 2023-01-31 DIAGNOSIS — N200 Calculus of kidney: Secondary | ICD-10-CM | POA: Insufficient documentation

## 2023-01-31 LAB — BASIC METABOLIC PANEL
Anion gap: 11 (ref 5–15)
BUN: 11 mg/dL (ref 6–20)
CO2: 25 mmol/L (ref 22–32)
Calcium: 10 mg/dL (ref 8.9–10.3)
Chloride: 103 mmol/L (ref 98–111)
Creatinine, Ser: 0.8 mg/dL (ref 0.44–1.00)
GFR, Estimated: >60 mL/min (ref >60)
Glucose, Bld: 94 mg/dL (ref 70–99)
Potassium: 4 mmol/L (ref 3.5–5.1)
Sodium: 139 mmol/L (ref 135–145)

## 2023-01-31 LAB — CBC
HCT: 45.5 % (ref 36.0–46.0)
Hemoglobin: 15.4 g/dL — ABNORMAL HIGH (ref 12.0–15.0)
MCH: 29.2 pg (ref 26.0–34.0)
MCHC: 33.8 g/dL (ref 30.0–36.0)
MCV: 86.2 fL (ref 80.0–100.0)
Platelets: 297 10*3/uL (ref 150–400)
RBC: 5.28 MIL/uL — ABNORMAL HIGH (ref 3.87–5.11)
RDW: 13.2 % (ref 11.5–15.5)
WBC: 9.6 10*3/uL (ref 4.0–10.5)
nRBC: 0 % (ref 0.0–0.2)

## 2023-01-31 LAB — URINALYSIS, ROUTINE W REFLEX MICROSCOPIC
Bacteria, UA: NONE SEEN
Bilirubin Urine: NEGATIVE
Glucose, UA: NEGATIVE mg/dL
Ketones, ur: NEGATIVE mg/dL
Leukocytes,Ua: NEGATIVE
Nitrite: NEGATIVE
Protein, ur: 30 mg/dL — AB
RBC / HPF: >50 RBC/hpf (ref 0–5)
Specific Gravity, Urine: 1.029 (ref 1.005–1.030)
pH: 5.5 (ref 5.0–8.0)

## 2023-01-31 MED ORDER — ONDANSETRON 4 MG PO TBDP: 4.0000 mg | ORAL_TABLET | Freq: Three times a day (TID) | ORAL | 0 refills | Status: AC | PRN

## 2023-01-31 MED ORDER — ONDANSETRON HCL 4 MG/2ML IJ SOLN
4.0000 mg | Freq: Once | INTRAMUSCULAR | Status: AC
  Administered 2023-01-31: 4 mg via INTRAVENOUS
  Filled 2023-01-31: qty 2

## 2023-01-31 MED ORDER — KETOROLAC TROMETHAMINE 15 MG/ML IJ SOLN
15.0000 mg | Freq: Once | INTRAMUSCULAR | Status: AC
  Administered 2023-01-31: 15 mg via INTRAVENOUS
  Filled 2023-01-31: qty 1

## 2023-01-31 MED ORDER — FENTANYL CITRATE PF 50 MCG/ML IJ SOSY
50.0000 ug | PREFILLED_SYRINGE | INTRAMUSCULAR | Status: DC | PRN
  Administered 2023-01-31: 50 ug via INTRAVENOUS
  Filled 2023-01-31: qty 1

## 2023-01-31 MED ORDER — TAMSULOSIN HCL 0.4 MG PO CAPS: 0.4000 mg | ORAL_CAPSULE | Freq: Every day | ORAL | 0 refills | Status: AC

## 2023-01-31 MED ORDER — TAMSULOSIN HCL 0.4 MG PO CAPS
0.4000 mg | ORAL_CAPSULE | ORAL | Status: AC
  Administered 2023-01-31: 0.4 mg via ORAL
  Filled 2023-01-31: qty 1

## 2023-01-31 MED ORDER — OXYCODONE HCL 5 MG PO TABS: 5.0000 mg | ORAL_TABLET | ORAL | 0 refills | Status: DC | PRN

## 2023-01-31 MED ORDER — TRAMADOL HCL 50 MG PO TABS: 50.0000 mg | ORAL_TABLET | Freq: Four times a day (QID) | ORAL | 0 refills | Status: DC | PRN

## 2023-01-31 NOTE — ED Provider Notes (Signed)
[place] EMERGENCY DEPARTMENT AT [facility] Provider Note   CSN: [id number] Arrival date & time: 01/31/23  1725     History  Chief Complaint  Patient presents with   Abdominal Pain   Back Pain    Natasha Fitzgerald is a 58 y.o. female.  58 year old female with history of kidney stones who presents emergency department with flank pain.  Patient reports that for the past few days she has been having pain in her back.  Says that today she started feeling it in the right side of her abdomen and flank.  Described it as a sensation of someone kicking her in her side.  Constant.  8/10 in severity.  Has had nausea and vomiting.  No fevers, dysuria or frequency.  No abdominal surgeries.       Home Medications Prior to Admission medications   Medication Sig Start Date End Date Taking? Authorizing Provider  ondansetron (ZOFRAN-ODT) 4 MG disintegrating tablet Take 1 tablet (4 mg total) by mouth every 8 (eight) hours as needed for nausea or vomiting. 01/31/23  Yes Rondel Baton, MD  tamsulosin [facility]) 0.4 MG CAPS capsule Take 1 capsule (0.4 mg total) by mouth daily after breakfast. 01/31/23  Yes Rondel Baton, MD  traMADol (ULTRAM) 50 MG tablet Take 1 tablet (50 mg total) by mouth every 6 (six) hours as needed. 01/31/23  Yes Rondel Baton, MD  acetaminophen (TYLENOL) 500 MG tablet Take 500 mg by mouth every 6 (six) hours as needed.    [provider]  albuterol (VENTOLIN HFA) 108 (90 Base) MCG/ACT inhaler Inhale 1-2 puffs into the lungs every 4 (four) hours as needed for wheezing or shortness of breath. 12/27/22   Wallis Bamberg, PA-C  cetirizine (ZYRTEC ALLERGY) 10 MG tablet Take 1 tablet (10 mg total) by mouth daily. 12/27/22   Wallis Bamberg, PA-C  dextromethorphan-guaiFENesin [facility] DM) 30-600 MG 12hr tablet Take 1 tablet by mouth 2 (two) times daily.    [provider]  diclofenac Sodium (VOLTAREN) 1 % GEL Apply 2 g topically 4 (four) times daily.  04/29/19   Burchette, Elberta Fortis, MD  eletriptan (RELPAX) 40 MG tablet TAKE 1 TABLET AS NEEDED FOR MIGRAINE, HEADACHE. MAY REPEAT IN 2 HOURS IF PERSISTS OR RECURS. 01/15/23   Burchette, Elberta Fortis, MD  eszopiclone (LUNESTA) 2 MG TABS tablet TAKE 1 TABLET (2 MG TOTAL) BY MOUTH IMMEDIATELY BEFORE BEDTIME AS NEEDED FOR SLEEP 11/26/22   Burchette, Elberta Fortis, MD  FLUoxetine (PROZAC) 40 MG capsule TAKE 1 CAPSULE (40 MG TOTAL) BY MOUTH DAILY. 11/16/22   Burchette, Elberta Fortis, MD  HYDROcodone bit-homatropine (HYCODAN) 5-1.5 MG/5ML syrup Take 5 mLs by mouth every 6 (six) hours as needed for cough. 12/31/22   Burchette, Elberta Fortis, MD  ipratropium (ATROVENT) 0.03 % nasal spray PLACE 2 SPRAYS INTO BOTH NOSTRILS EVERY 12 HOURS. 12/21/21   Burchette, Elberta Fortis, MD  levothyroxine (SYNTHROID) 88 MCG tablet TAKE 1 TABLET BY MOUTH EVERY DAY BEFORE BREAKFAST 10/24/22   Burchette, Elberta Fortis, MD  losartan (COZAAR) 25 MG tablet TAKE 2 TABLETS BY MOUTH EVERY DAY 12/11/22   Burchette, Elberta Fortis, MD  meloxicam (MOBIC) 15 MG tablet One tab PO qAM with breakfast for 2 weeks, then daily prn pain. 12/10/22   Rodolph Bong, MD  phenazopyridine (PYRIDIUM) 200 MG tablet Take 200 mg by mouth 3 (three) times daily as needed for pain.    [provider]  predniSONE (DELTASONE) 20 MG tablet Take 2 tablets daily  with breakfast. 12/27/22   Wallis Bamberg, PA-C      Allergies    Doxycycline and Penicillins    Review of Systems   Review of Systems  Physical Exam Updated Vital Signs BP (!) 178/106   Pulse 82   Temp 97.8 F (36.6 C) (Oral)   Resp 20   LMP 12/26/2012   SpO2 100%  Physical Exam Vitals and nursing note reviewed.  Constitutional:      General: She is not in acute distress.    Appearance: She is well-developed.  HENT:     Head: Normocephalic and atraumatic.     Right Ear: External ear normal.     Left Ear: External ear normal.     Nose: Nose normal.  Eyes:     Extraocular Movements: Extraocular movements intact.      Conjunctiva/sclera: Conjunctivae normal.     Pupils: Pupils are equal, round, and reactive to light.  Pulmonary:     Effort: Pulmonary effort is normal. No respiratory distress.  Abdominal:     General: Abdomen is flat. There is no distension.     Palpations: Abdomen is soft. There is no mass.     Tenderness: There is abdominal tenderness (Right mid abdomen.  Negative Murphy sign.). There is no right CVA tenderness, left CVA tenderness or guarding.  Musculoskeletal:     Cervical back: Normal range of motion and neck supple.     Right lower leg: No edema.     Left lower leg: No edema.  Skin:    General: Skin is warm and dry.  Neurological:     Mental Status: She is alert and oriented to person, place, and time. Mental status is at baseline.  Psychiatric:        Mood and Affect: Mood normal.     ED Results / Procedures / Treatments   Labs (all labs ordered are listed, but only abnormal results are displayed) Labs Reviewed  URINALYSIS, ROUTINE W REFLEX MICROSCOPIC - Abnormal; Notable for the following components:      Result Value   APPearance HAZY (*)    Hgb urine dipstick LARGE (*)    Protein, ur 30 (*)    All other components within normal limits  CBC - Abnormal; Notable for the following components:   RBC 5.28 (*)    Hemoglobin 15.4 (*)    All other components within normal limits  BASIC METABOLIC PANEL    EKG None  Radiology CT Renal Stone Study Result Date: 01/31/2023 CLINICAL DATA:  Right flank pain, history of stones. EXAM: CT ABDOMEN AND PELVIS WITHOUT CONTRAST TECHNIQUE: Multidetector CT imaging of the abdomen and pelvis was performed following the standard protocol without IV contrast. RADIATION DOSE REDUCTION: This exam was performed according to the departmental dose-optimization program which includes automated exposure control, adjustment of the mA and/or kV according to patient size and/or use of iterative reconstruction technique. COMPARISON:  05/09/2021.  FINDINGS: Lower chest: No acute abnormality. A 5 mm nodule is present in the right middle lobe, unchanged from 2023. Hepatobiliary: No focal liver abnormality is seen. No gallstones, gallbladder wall thickening, or biliary dilatation. Pancreas: Unremarkable. No pancreatic ductal dilatation or surrounding inflammatory changes. Spleen: Normal in size without focal abnormality. Adrenals/Urinary Tract: No adrenal nodule or mass. Renal calculi are present bilaterally. There is mild obstructive uropathy on the right with a 4 x 5 mm calculus in the proximal right ureter at the ureteropelvic junction. No obstructive uropathy on the left. The bladder is unremarkable.  Stomach/Bowel: There is a small hiatal hernia. Stomach is within normal limits. Appendix appears normal. No evidence of bowel wall thickening, distention, or inflammatory changes. No free air or pneumatosis is seen. Scattered diverticula are present along the colon without evidence of diverticulitis. Vascular/Lymphatic: Aortic atherosclerosis. No enlarged abdominal or pelvic lymph nodes. Reproductive: Uterus and bilateral adnexa are unremarkable. Other: No abdominopelvic ascites. Musculoskeletal: Degenerative changes are present in the thoracolumbar spine. IMPRESSION: 1. Mild obstructive uropathy on the right with a 4 x 5 mm calculus in the proximal right ureter at the UPJ. 2. Bilateral nephrolithiasis. 3. Small hiatal hernia. 4. Aortic atherosclerosis. Electronically Signed   By: Thornell Sartorius M.D.   On: 01/31/2023 19:34    Procedures Procedures    Medications Ordered in ED Medications  ondansetron (ZOFRAN) injection 4 mg (4 mg Intravenous Given 01/31/23 1800)  ondansetron (ZOFRAN) injection 4 mg (4 mg Intravenous Given 01/31/23 1851)  ketorolac (TORADOL) 15 MG/ML injection 15 mg (15 mg Intravenous Given 01/31/23 1851)  tamsulosin (FLOMAX) capsule 0.4 mg (0.4 mg Oral Given 01/31/23 1945)    ED Course/ Medical Decision Making/ A&P                                  Medical Decision Making Amount and/or Complexity of Data Reviewed Labs: ordered. Radiology: ordered.  Risk Prescription drug management.   Tashai Catino is a 58 y.o. female with comorbidities that complicate the patient evaluation including kidney stones who presents to the ED with R flank pain   Initial Ddx:  Nephrolithiasis, pyelonephritis, lumbar radiculopathy, AAA  MDM:  Feel the patient likely has nephrolithiasis based on their symptoms.  Will obtain urinalysis to assess for pyelonephritis as well but feel this is less likely given the lack of systemic symptoms.  Given age will also assess for AAA with CT scan. No symptoms that would be suggestive of radiculopathy.  Plan:  Labs Urinalysis CT stone protocol Pain medication  ED Summary/Re-evaluation:  CT scan showed a right-sided 4 x 5 mm kidney stone.  Urinalysis without signs of urinary tract infection.  No AKI on lab work.  Patient's pain was able to be controlled and they were given a urine strainer and instructions on how to use it.  Will have them follow-up with urology for additional evaluation.  Was sent home with instructions to take Tylenol, ibuprofen, Flomax, and tramadol (had an adverse reaction to oxycodone before was tolerated tramadol) for any breakthrough pain.  This patient presents to the ED for concern of complaints listed in HPI, this involves an extensive number of treatment options, and is a complaint that carries with it a high risk of complications and morbidity. Disposition including potential need for admission considered.   Dispo: DC Home. Return precautions discussed including, but not limited to, those listed in the AVS. Allowed pt time to ask questions which were answered fully prior to dc.  Additional history obtained from spouse Records reviewed Outpatient Clinic Notes The following labs were independently interpreted: Urinalysis and show  hematuria can consistent with  nephrolithiasis I independently reviewed the following imaging with scope of interpretation limited to determining acute life threatening conditions related to emergency care: CT Abdomen/Pelvis and agree with the radiologist interpretation with the following exceptions: none I have reviewed the patients home medications and made adjustments as needed   Final Clinical Impression(s) / ED Diagnoses Final diagnoses:  Nephrolithiasis    Rx / DC  Orders ED Discharge Orders          Ordered    ondansetron (ZOFRAN-ODT) 4 MG disintegrating tablet  Every 8 hours PRN        01/31/23 1948    tamsulosin (FLOMAX) 0.4 MG CAPS capsule  Daily after breakfast        01/31/23 1948    oxyCODONE (ROXICODONE) 5 MG immediate release tablet  Every 4 hours PRN,   Status:  Discontinued        01/31/23 1948    traMADol (ULTRAM) 50 MG tablet  Every 6 hours PRN        01/31/23 1954              Rondel Baton, MD 02/01/23 1104

## 2023-01-31 NOTE — ED Triage Notes (Addendum)
Intense right flank pain into groin. Hx kidney stones. N. Tearful in triage. Still able to pass urine.

## 2023-01-31 NOTE — Discharge Instructions (Addendum)
You were seen for your kidney stone in the emergency department.    At home, please take Tylenol and ibuprofen for your pain. You may also take the tramadol we have prescribed you for any breakthrough pain that may have.  Do not take this before driving or operating heavy machinery.  Do not take this medication with alcohol.  Use the flowmax we have give you every day until the kidney stone passes. Please also strain your urine to collect the kidney stone. Store it in a container and take it to your urologist for analysis.   Follow-up with urology in a week to discuss your symptoms.   Return immediately to the emergency department if you experience any of the following: fever, unbearable pain, urinary retention, or any other concerning symptoms.    Thank you for visiting our Emergency Department. It was a pleasure taking care of you today.

## 2023-02-05 ENCOUNTER — Encounter: Payer: Self-pay | Admitting: Family Medicine

## 2023-02-05 ENCOUNTER — Ambulatory Visit (INDEPENDENT_AMBULATORY_CARE_PROVIDER_SITE_OTHER): Payer: 59 | Admitting: Family Medicine

## 2023-02-05 VITALS — BP 146/80 | HR 100 | Temp 98.7°F | Ht 63.0 in | Wt 183.7 lb

## 2023-02-05 DIAGNOSIS — E785 Hyperlipidemia, unspecified: Secondary | ICD-10-CM | POA: Diagnosis not present

## 2023-02-05 DIAGNOSIS — I700 Atherosclerosis of aorta: Secondary | ICD-10-CM | POA: Diagnosis not present

## 2023-02-05 DIAGNOSIS — F339 Major depressive disorder, recurrent, unspecified: Secondary | ICD-10-CM

## 2023-02-05 DIAGNOSIS — N200 Calculus of kidney: Secondary | ICD-10-CM
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
## 2023-02-05 NOTE — Patient Instructions (Addendum)
Set up fasting lipid panel.    Consider coronary calcium score and let me know.

## 2023-02-12 ENCOUNTER — Ambulatory Visit (INDEPENDENT_AMBULATORY_CARE_PROVIDER_SITE_OTHER): Payer: 59 | Admitting: Family Medicine

## 2023-02-12 VITALS — BP 118/80 | HR 94 | Temp 98.8°F | Ht 63.0 in | Wt 177.0 lb

## 2023-02-12 DIAGNOSIS — F339 Major depressive disorder, recurrent, unspecified: Secondary | ICD-10-CM

## 2023-02-12 DIAGNOSIS — Z0279 Encounter for issue of other medical certificate: Secondary | ICD-10-CM
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
## 2023-02-13 ENCOUNTER — Other Ambulatory Visit: Payer: Self-pay | Admitting: Family Medicine

## 2023-02-15 ENCOUNTER — Other Ambulatory Visit (INDEPENDENT_AMBULATORY_CARE_PROVIDER_SITE_OTHER): Payer: 59

## 2023-02-15 DIAGNOSIS — E785 Hyperlipidemia, unspecified: Secondary | ICD-10-CM | POA: Diagnosis not present

## 2023-02-15 LAB — LIPID PANEL
Cholesterol: 144 mg/dL (ref 0–200)
HDL: 31.3 mg/dL — ABNORMAL LOW (ref >39.00)
LDL Cholesterol: 92 mg/dL (ref 0–99)
NonHDL: 112.24
Total CHOL/HDL Ratio: 5
Triglycerides: 100 mg/dL (ref 0.0–149.0)
VLDL: 20 mg/dL (ref 0.0–40.0)

## 2023-02-16 ENCOUNTER — Other Ambulatory Visit: Payer: Self-pay | Admitting: Family Medicine

## 2023-02-21 ENCOUNTER — Other Ambulatory Visit: Payer: Self-pay | Admitting: Family Medicine

## 2023-03-05 ENCOUNTER — Encounter: Payer: Self-pay | Admitting: Family Medicine

## 2023-03-05 ENCOUNTER — Ambulatory Visit (INDEPENDENT_AMBULATORY_CARE_PROVIDER_SITE_OTHER): Admitting: Family Medicine

## 2023-03-05 VITALS — BP 130/80 | HR 72 | Temp 97.9°F | Ht 63.0 in | Wt 179.7 lb

## 2023-03-05 DIAGNOSIS — R300 Dysuria: Secondary | ICD-10-CM

## 2023-03-05 LAB — POC URINALSYSI DIPSTICK (AUTOMATED)
Bilirubin, UA: NEGATIVE
Blood, UA: POSITIVE
Glucose, UA: NEGATIVE
Ketones, UA: NEGATIVE
Leukocytes, UA: NEGATIVE
Nitrite, UA: POSITIVE
Protein, UA: NEGATIVE
Spec Grav, UA: 1.015 (ref 1.010–1.025)
Urobilinogen, UA: 0.2 U/dL
pH, UA: 6 (ref 5.0–8.0)

## 2023-03-05 MED ORDER — NITROFURANTOIN MONOHYD MACRO 100 MG PO CAPS: 100.0000 mg | ORAL_CAPSULE | Freq: Two times a day (BID) | ORAL | 0 refills | Status: DC

## 2023-03-05 NOTE — Progress Notes (Signed)
 Established Patient Office Visit  Subjective   Patient ID: Natasha Fitzgerald, female    DOB: [birth year]  Age: 58 y.o. MRN: [id number]  Chief Complaint  Patient presents with   Nephrolithiasis    History     HPI   Natasha Fitzgerald is seen with onset last Thursday of some urine frequency and burning with urination.  She does have history of kidney stones but states this is different.  Denies any flank pain.  No fevers or chills.  No gross hematuria.  No history of recent UTI.  She has reported allergies to doxycycline and penicillin.  Was recently prescribed Septra DS per urology but had nausea  Past Medical History:  Diagnosis Date   Anxiety    Chronic headaches 09/28/2009   DEPRESSION 09/28/2009   Fibromyalgia    History of kidney stones    HYPERTENSION 09/28/2009   IRREGULAR MENSES 09/28/2009   RUQ pain    Thyroid disease    Past Surgical History:  Procedure Laterality Date   CESAREAN SECTION  1997   TONSILLECTOMY  1973    reports that she has never smoked. She has never used smokeless tobacco. She reports current alcohol use. She reports that she does not use drugs. family history includes Breast cancer in her maternal grandmother; Cancer in her paternal grandmother; Hashimoto's thyroiditis in her sister; Heart disease in her maternal grandfather and paternal grandfather; Hypertension in her mother. Allergies  Allergen Reactions   Doxycycline     "headache, irritability, upset stomach"   Penicillins     REACTION: hives    Review of Systems  Constitutional:  Negative for chills and fever.  Genitourinary:  Positive for dysuria and frequency. Negative for flank pain and hematuria.      Objective:     BP 130/80   Pulse 72   Temp 97.9 F (36.6 C) (Oral)   Ht 5\' 3"  (1.6 m)   Wt 179 lb 11.2 oz (81.5 kg)   LMP 12/26/2012   SpO2 99%   BMI 31.83 kg/m  BP Readings from Last 3 Encounters:  03/05/23 130/80  02/12/23 118/80  02/05/23 (!) 146/80   Wt Readings from Last  3 Encounters:  03/05/23 179 lb 11.2 oz (81.5 kg)  02/12/23 177 lb (80.3 kg)  02/05/23 183 lb 11.2 oz (83.3 kg)      Physical Exam Vitals reviewed.  Constitutional:      General: She is not in acute distress.    Appearance: She is not ill-appearing.  Cardiovascular:     Rate and Rhythm: Normal rate and regular rhythm.  Pulmonary:     Effort: Pulmonary effort is normal.     Breath sounds: Normal breath sounds. No wheezing or rales.  Neurological:     Mental Status: She is alert.      Results for orders placed or performed in visit on 03/05/23  POCT Urinalysis Dipstick (Automated)  Result Value Ref Range   Color, UA yellow    Clarity, UA clear    Glucose, UA Negative Negative   Bilirubin, UA neg    Ketones, UA neg    Spec Grav, UA 1.015 1.010 - 1.025   Blood, UA positive    pH, UA 6.0 5.0 - 8.0   Protein, UA Negative Negative   Urobilinogen, UA 0.2 0.2 or 1.0 E.U./dL   Nitrite, UA positive    Leukocytes, UA Negative Negative      The 10-year ASCVD risk score (Arnett DK, et al., 2019) is: 4.3%  Assessment & Plan:   Dysuria.  Urine dipstick reveals positive nitrites and blood.  Possible UTI.  Urine culture sent.  Stay well-hydrated.  Start Macrobid 1 p.o. twice daily for 5 days pending culture results   No follow-ups on file.    Evelena Peat, MD

## 2023-03-06 LAB — URINE CULTURE
MICRO NUMBER:: 16156909
Result:: NO GROWTH
SPECIMEN QUALITY:: ADEQUATE

## 2023-03-07 NOTE — Addendum Note (Signed)
 Addended by: Christy Sartorius on: 03/07/2023 01:06 PM   Modules accepted: Orders

## 2023-03-12 IMAGING — US US ABDOMEN LIMITED
1 series · 15 of 25 positions shown · non-contrast
Comparison: None Available.

CLINICAL DATA: Upper abdominal pain for several years.

EXAM:
ULTRASOUND ABDOMEN LIMITED RIGHT UPPER QUADRANT

[Series 1: us abdomen limited ruq mc & wl · 15 of 57 slices shown]
[im 1/57]
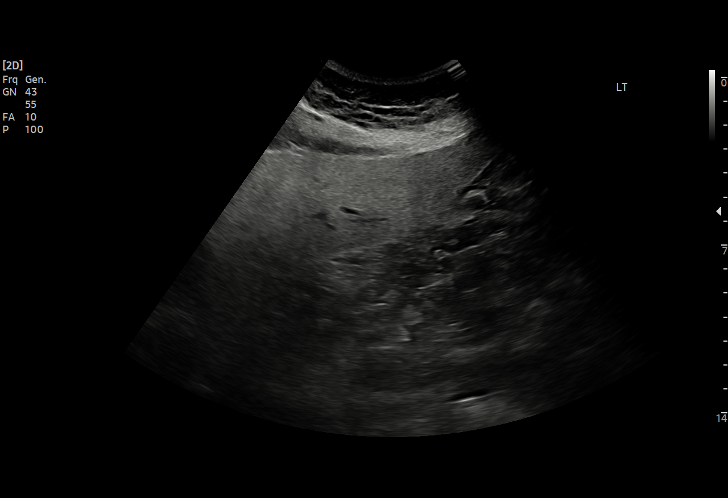
[im 5/57]
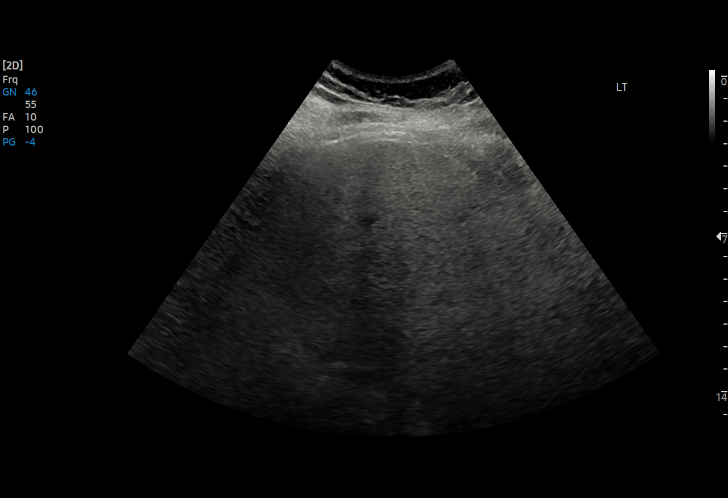
[im 10/57]
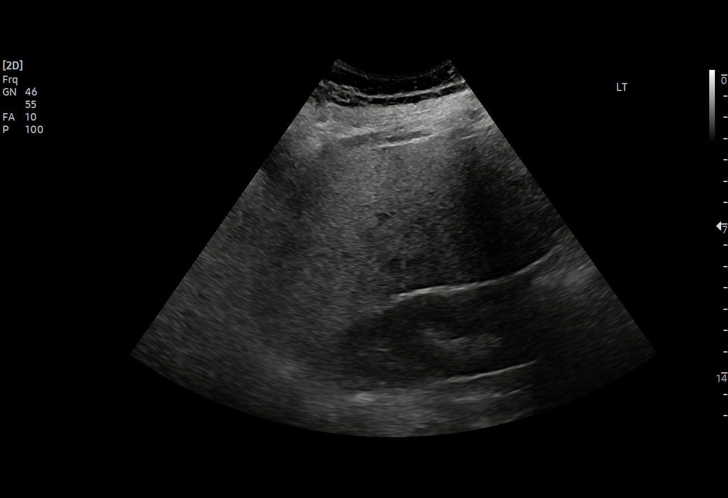
[im 12/57]
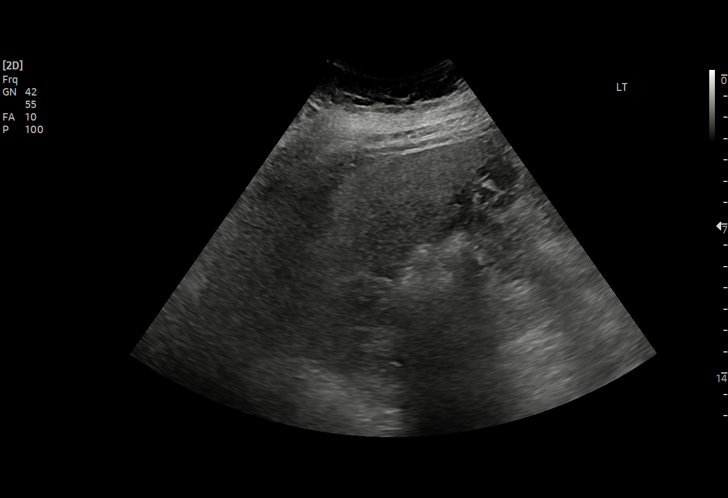
[im 17/57]
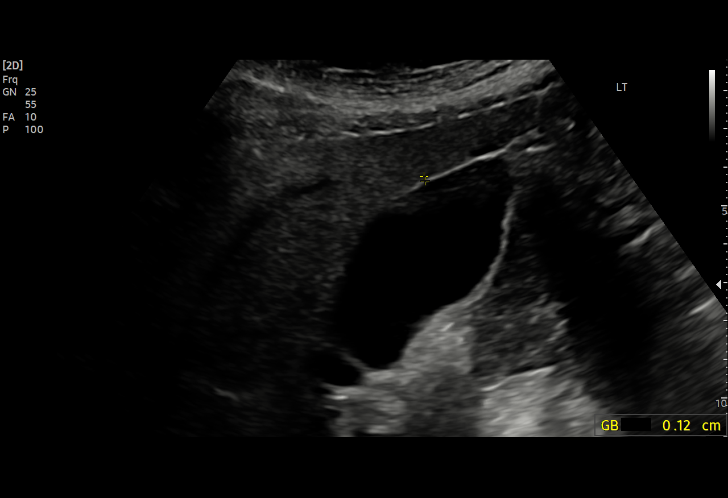
[im 22/57]
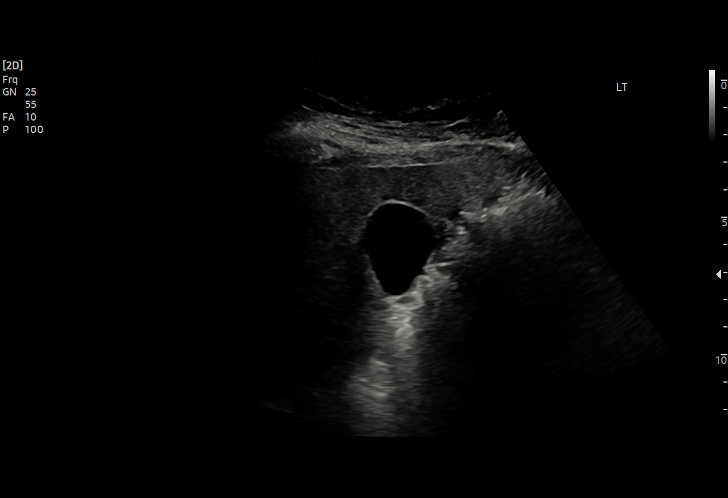
[im 24/57]
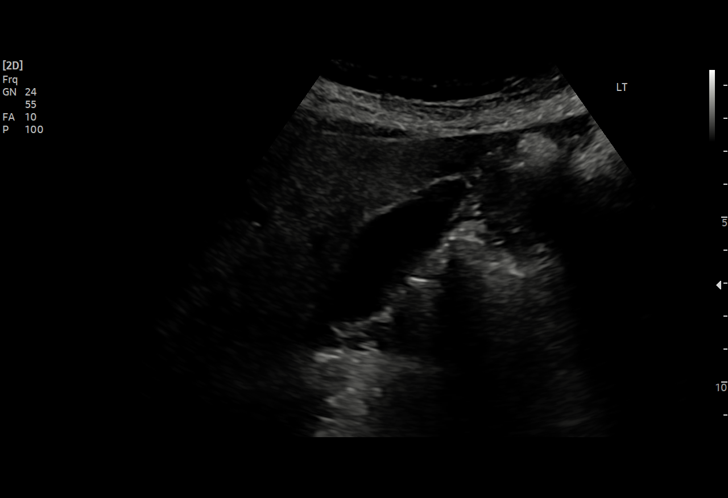
[im 29/57]
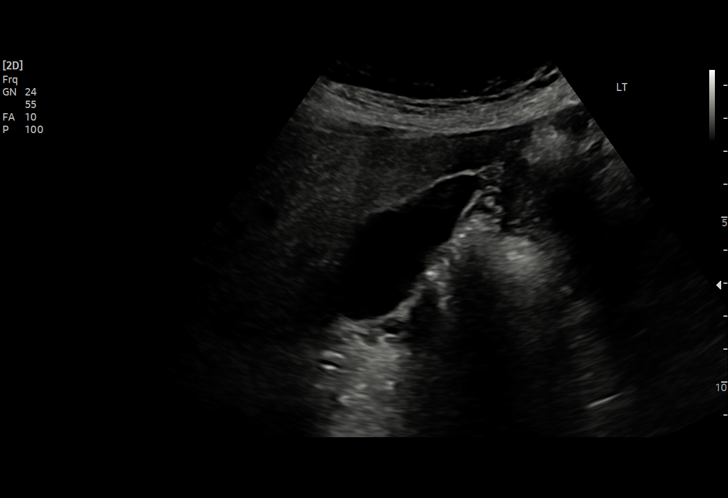
[im 33/57]
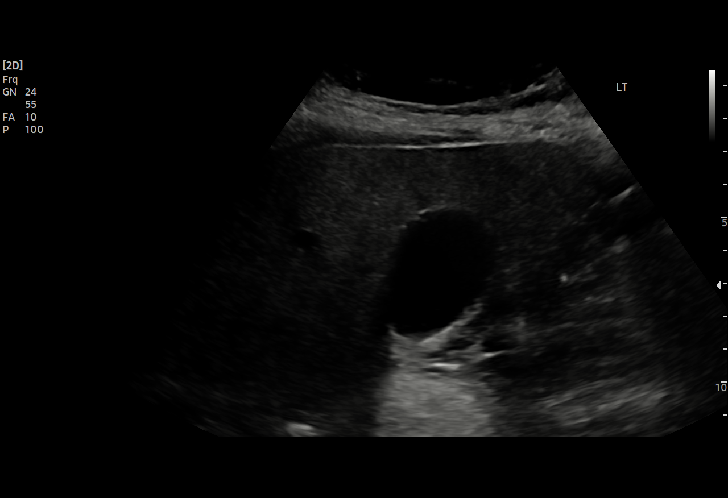
[im 36/57]
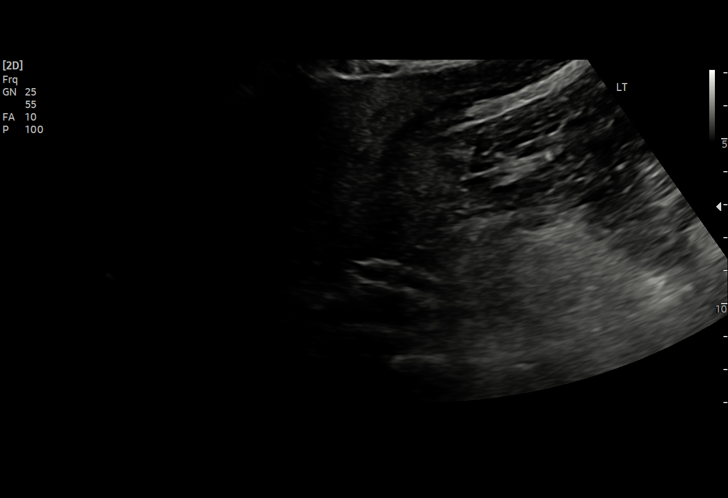
[im 40/57]
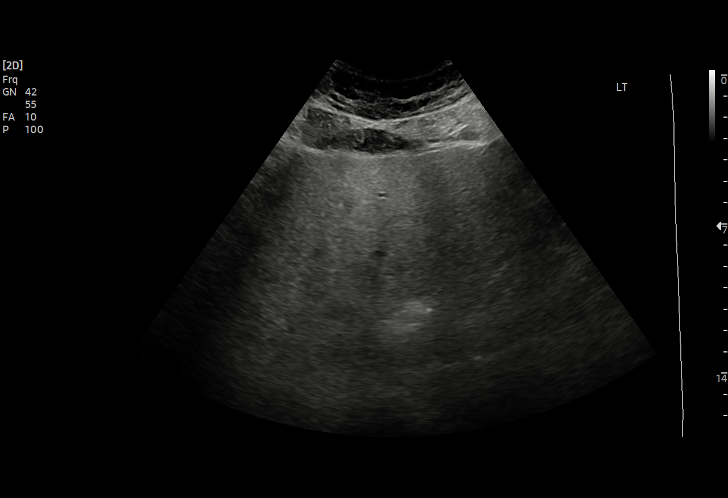
[im 45/57]
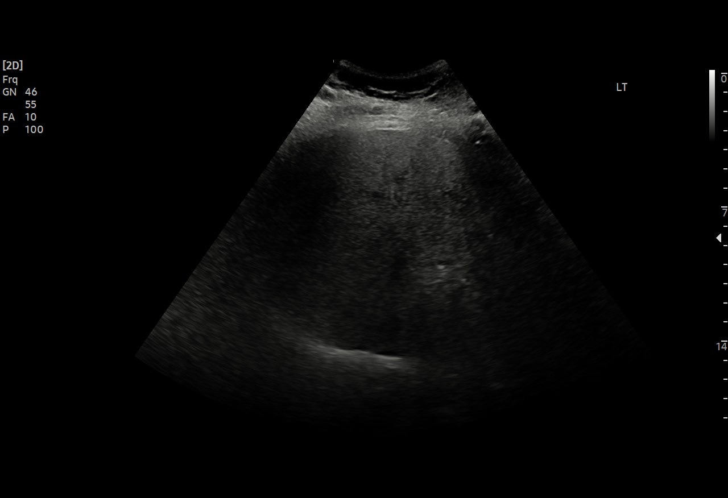
[im 47/57]
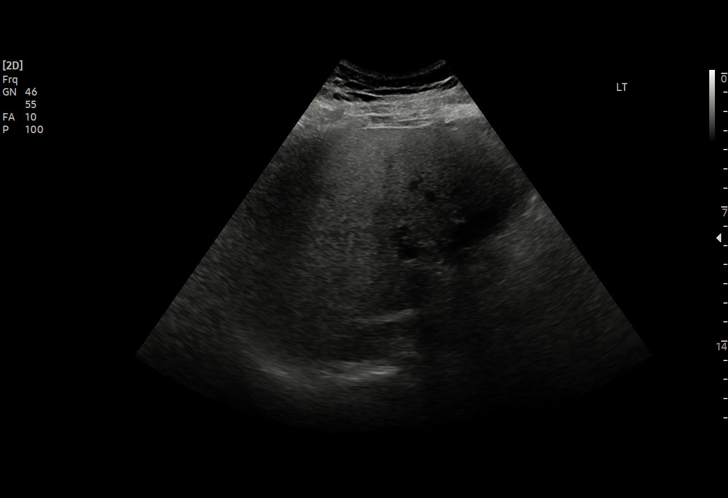
[im 52/57]
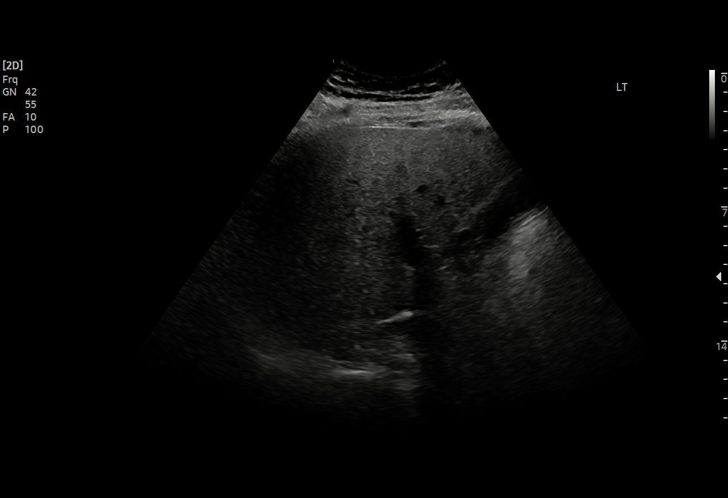
[im 57/57]
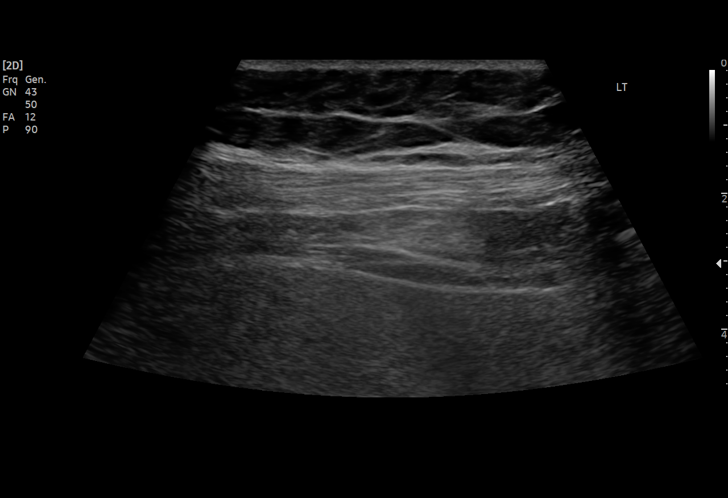

[15 of 25 positions shown; findings below may reference images not displayed]

FINDINGS: Gallbladder:

No gallstones or wall thickening visualized. No sonographic Murphy
sign noted by sonographer.

Common bile duct:

Diameter: 5.3 mm

Liver:

Diffuse increased echogenicity. No focal mass. Portal vein is patent
on color Doppler imaging with normal direction of blood flow towards
the liver.

Other: None.
IMPRESSION: Diffuse increased echogenicity throughout the liver is consistent
with hepatic steatosis seen on a CT of the abdomen and pelvis May 09, 2021. No other abnormalities.

## 2023-04-16 ENCOUNTER — Other Ambulatory Visit: Payer: Self-pay | Admitting: Family Medicine

## 2023-06-12 ENCOUNTER — Other Ambulatory Visit: Payer: Self-pay | Admitting: Family Medicine

## 2023-08-28 ENCOUNTER — Other Ambulatory Visit: Payer: Self-pay | Admitting: Family Medicine

## 2023-09-25 ENCOUNTER — Ambulatory Visit
Admission: RE | Admit: 2023-09-25 | Discharge: 2023-09-25 | Disposition: A | Discharge status: Home / Self Care | Source: Ambulatory Visit | Attending: Family Medicine | Admitting: Family Medicine

## 2023-09-25 VITALS — BP 135/78 | HR 98 | Resp 15

## 2023-09-25 DIAGNOSIS — R519 Headache, unspecified: Secondary | ICD-10-CM | POA: Diagnosis not present

## 2023-09-25 MED ORDER — KETOROLAC TROMETHAMINE 30 MG/ML IJ SOLN
30.0000 mg | Freq: Once | INTRAMUSCULAR | Status: AC
  Administered 2023-09-25: 30 mg via INTRAMUSCULAR

## 2023-09-25 NOTE — ED Triage Notes (Signed)
 Pt present with c/o headaches, hypertension. Pt states her BP at home has been elevated for six days. Pt states her BP was 158/97 at home and has been taking her medications as prescribed. States she was prescribed new medication three weeks ago. Reports she has been feeling dizzy and nauseous on and off.

## 2023-09-25 NOTE — Discharge Instructions (Signed)
 You were given a Toradol  injection in clinic today. Do not take any over the counter NSAID's such as Advil , ibuprofen , Aleve, or naproxen for 24 hours. You may take tylenol  if needed.  Keep a BP log checking it twice daily for the next several days and take this to your PCP.  Also follow-up with your prescriber of the Wellbutrin to discuss side effects of your headaches.  Please go to the ER if you develop any worsening symptoms such as the worst headache of your life, vomiting, visual changes, passing out, or any new concerns that arise.  I hope you feel better soon!

## 2023-09-25 NOTE — ED Provider Notes (Signed)
 [facility]    CSN: [id number] Arrival date & time: 09/25/23  1829      History   Chief Complaint Chief Complaint  Patient presents with   Headache    Elevated blood pressure 158/97 - Entered by patient    HPI Natasha Fitzgerald is a 58 y.o. female presents for headaches and high blood pressure.  Patient does have a history of hypertension and takes Cozaar  currently.  She reports couple weeks ago she was started on Wellbutrin by her psychiatrist and states that is when the headache started.  She reports kind of a global headache and currently rates her headache as a 3 out of 10 and denies worst headache of life.  States today she checked her blood pressure and it was 158/97.  She has been taking her BP meds as prescribed.  She denies any chest pain, shortness of breath, syncope, unilateral weakness, visual changes.  States she has photophobia normally.  Reports some intermittent dizziness and nausea.  She reports has a history of headaches since she had a head injury several years ago she has been taking Tylenol  and ibuprofen  without improvement.  She also took her Relpax  but states it did not help.  She has not taken anything today for headache.   Headache Associated symptoms: dizziness     Past Medical History:  Diagnosis Date   Anxiety    Chronic headaches 09/28/2009   DEPRESSION 09/28/2009   Fibromyalgia    History of kidney stones    HYPERTENSION 09/28/2009   IRREGULAR MENSES 09/28/2009   RUQ pain    Thyroid  disease     Patient Active Problem List   Diagnosis Date Noted   Migraine headache 01/30/2022   Fatty liver 05/21/2021   Fibromyalgia 05/06/2020   GERD (gastroesophageal reflux disease) 03/27/2019   Depression, recurrent 11/25/2018   Onychomycosis 07/06/2013   Obesity (BMI 30-39.9) 07/06/2013   Osteoarthritis, multiple sites 04/21/2012   Hypothyroid 03/07/2011   Biliary dyskinesia 07/04/2010   Essential hypertension 09/28/2009   IRREGULAR MENSES  09/28/2009   Headache 09/28/2009    Past Surgical History:  Procedure Laterality Date   CESAREAN SECTION  1997   TONSILLECTOMY  1973    OB History   No obstetric history on file.      Home Medications    Prior to Admission medications   Medication Sig Start Date End Date Taking? Authorizing Provider  acetaminophen  (TYLENOL ) 500 MG tablet Take 500 mg by mouth every 6 (six) hours as needed.    [provider]  albuterol  (VENTOLIN  HFA) 108 (90 Base) MCG/ACT inhaler Inhale 1-2 puffs into the lungs every 4 (four) hours as needed for wheezing or shortness of breath. 12/27/22   Christopher Savannah, PA-C  cetirizine  (ZYRTEC  ALLERGY) 10 MG tablet Take 1 tablet (10 mg total) by mouth daily. 12/27/22   Christopher Savannah, PA-C  diclofenac  Sodium (VOLTAREN ) 1 % GEL Apply 2 g topically 4 (four) times daily. 04/29/19   Burchette, Wolm ORN, MD  eletriptan  (RELPAX ) 40 MG tablet TAKE 1 TABLET AS NEEDED FOR MIGRAINE, HEADACHE. MAY REPEAT IN 2 HOURS IF PERSISTS OR RECURS. 08/28/23   Burchette, Wolm ORN, MD  eszopiclone  (LUNESTA ) 2 MG TABS tablet TAKE 1 TABLET (2 MG TOTAL) BY MOUTH IMMEDIATELY BEFORE BEDTIME AS NEEDED FOR SLEEP 11/26/22   Burchette, Wolm ORN, MD  FLUoxetine  (PROZAC ) 40 MG capsule TAKE 1 CAPSULE (40 MG TOTAL) BY MOUTH DAILY. 02/13/23   Burchette, Wolm ORN, MD  ipratropium (ATROVENT ) 0.03 %  nasal spray PLACE 2 SPRAYS INTO BOTH NOSTRILS EVERY 12 HOURS. 12/21/21   Burchette, Wolm ORN, MD  levothyroxine  (SYNTHROID ) 88 MCG tablet TAKE 1 TABLET BY MOUTH EVERY DAY BEFORE BREAKFAST 04/16/23   Burchette, Wolm ORN, MD  losartan  (COZAAR ) 25 MG tablet TAKE 2 TABLETS BY MOUTH EVERY DAY 06/12/23   Burchette, Wolm ORN, MD  meloxicam  (MOBIC ) 15 MG tablet One tab PO qAM with breakfast for 2 weeks, then daily prn pain. 12/10/22   Corey, Evan S, MD  nitrofurantoin , macrocrystal-monohydrate, (MACROBID ) 100 MG capsule Take 1 capsule (100 mg total) by mouth 2 (two) times daily. 03/05/23   Burchette, Wolm ORN, MD  ondansetron   (ZOFRAN -ODT) 4 MG disintegrating tablet Take 1 tablet (4 mg total) by mouth every 8 (eight) hours as needed for nausea or vomiting. 01/31/23   Yolande Lamar BROCKS, MD  phenazopyridine (PYRIDIUM) 200 MG tablet Take 200 mg by mouth 3 (three) times daily as needed for pain.    [provider]  tamsulosin  (FLOMAX ) 0.4 MG CAPS capsule Take 1 capsule (0.4 mg total) by mouth daily after breakfast. 01/31/23   Yolande Lamar BROCKS, MD  traMADol  (ULTRAM ) 50 MG tablet Take 1 tablet (50 mg total) by mouth every 6 (six) hours as needed. 01/31/23   Yolande Lamar BROCKS, MD    Family History Family History  Problem Relation Age of Onset   Hypertension Mother    Hashimoto's thyroiditis Sister    Breast cancer Maternal Grandmother    Heart disease Maternal Grandfather    Cancer Paternal Grandmother        breast cancer   Heart disease Paternal Grandfather    Colon cancer Neg Hx    Colon polyps Neg Hx    Esophageal cancer Neg Hx    Rectal cancer Neg Hx    Stomach cancer Neg Hx     Social History Social History   Tobacco Use   Smoking status: Never   Smokeless tobacco: Never  Vaping Use   Vaping status: Never Used  Substance Use Topics   Alcohol use: Yes    Comment: occ   Drug use: No     Allergies   Doxycycline  and Penicillins   Review of Systems Review of Systems  Neurological:  Positive for dizziness and headaches.     Physical Exam Triage Vital Signs ED Triage Vitals  Encounter Vitals Group     BP 09/25/23 1859 135/78     Girls Systolic BP Percentile --      Girls Diastolic BP Percentile --      Boys Systolic BP Percentile --      Boys Diastolic BP Percentile --      Pulse Rate 09/25/23 1859 98     Resp 09/25/23 1859 15     Temp --      Temp Source 09/25/23 1859 Oral     SpO2 09/25/23 1859 96 %     Weight --      Height --      Head Circumference --      Peak Flow --      Pain Score 09/25/23 1858 10     Pain Loc --      Pain Education --      Exclude from Growth  Chart --    No data found.  Updated Vital Signs BP 135/78 (BP Location: Left Arm)   Pulse 98   Resp 15   LMP 12/26/2012   SpO2 96%   Visual Acuity Right Eye  Distance:   Left Eye Distance:   Bilateral Distance:    Right Eye Near:   Left Eye Near:    Bilateral Near:     Physical Exam Vitals and nursing note reviewed.  Constitutional:      General: She is not in acute distress.    Appearance: Normal appearance. She is not ill-appearing.  HENT:     Head: Normocephalic and atraumatic.  Eyes:     Extraocular Movements: Extraocular movements intact.     Conjunctiva/sclera: Conjunctivae normal.     Pupils: Pupils are equal, round, and reactive to light.  Cardiovascular:     Rate and Rhythm: Normal rate.  Pulmonary:     Effort: Pulmonary effort is normal.  Skin:    General: Skin is warm and dry.  Neurological:     General: No focal deficit present.     Mental Status: She is alert and oriented to person, place, and time.     GCS: GCS eye subscore is 4. GCS verbal subscore is 5. GCS motor subscore is 6.     Cranial Nerves: No facial asymmetry.     Motor: No weakness.     Coordination: Romberg sign negative. Finger-Nose-Finger Test normal.     Gait: Tandem walk normal.  Psychiatric:        Mood and Affect: Mood normal.        Behavior: Behavior normal.      UC Treatments / Results  Labs (all labs ordered are listed, but only abnormal results are displayed) Labs Reviewed - No data to display  EKG   Radiology No results found.  Procedures Procedures (including critical care time)  Medications Ordered in UC Medications  ketorolac  (TORADOL ) 30 MG/ML injection 30 mg (30 mg Intramuscular Given 09/25/23 1925)    Initial Impression / Assessment and Plan / UC Course  I have reviewed the triage vital signs and the nursing notes.  Pertinent labs & imaging results that were available during my care of the patient were reviewed by me and considered in my medical  decision making (see chart for details).     Reviewed exam and symptoms with patient.  No red flags.  Patient was given Toradol  injection in clinic with improvement in headache.  She was instructed no NSAIDs for 24 hours and she verbalized understanding her vital signs remained stable and her blood pressure is 135/78.  I discussed that Wellbutrin can cause headaches and that she should discuss this with her prescriber of the medication to see if they need to make any changes.  I also recommend she keep a BP log checking it twice daily for the next several days and take this to her PCP.  Encouraged lots of rest and fluids and to go to the emergency room for any worsening symptoms.  Patient verbalized understanding these instructions. Final Clinical Impressions(s) / UC Diagnoses   Final diagnoses:  Acute nonintractable headache, unspecified headache type     Discharge Instructions      You were given a Toradol  injection in clinic today. Do not take any over the counter NSAID's such as Advil , ibuprofen , Aleve, or naproxen for 24 hours. You may take tylenol  if needed.  Keep a BP log checking it twice daily for the next several days and take this to your PCP.  Also follow-up with your prescriber of the Wellbutrin to discuss side effects of your headaches.  Please go to the ER if you develop any worsening symptoms such as the worst  headache of your life, vomiting, visual changes, passing out, or any new concerns that arise.  I hope you feel better soon!      ED Prescriptions   None    PDMP not reviewed this encounter.   Loreda Myla SAUNDERS, NP 09/25/23 [phone]

## 2023-09-29 ENCOUNTER — Other Ambulatory Visit: Payer: Self-pay | Admitting: Family Medicine

## 2023-10-01 ENCOUNTER — Ambulatory Visit: Admitting: Family Medicine

## 2023-10-01 ENCOUNTER — Encounter: Payer: Self-pay | Admitting: Family Medicine

## 2023-10-01 VITALS — BP 142/90 | HR 78 | Temp 97.9°F | Wt 181.7 lb

## 2023-10-01 DIAGNOSIS — I10 Essential (primary) hypertension: Secondary | ICD-10-CM | POA: Diagnosis not present

## 2023-10-01 MED ORDER — LOSARTAN POTASSIUM-HCTZ 50-12.5 MG PO TABS: 1.0000 | ORAL_TABLET | Freq: Every day | ORAL | 6 refills | Status: AC

## 2023-10-01 NOTE — Progress Notes (Signed)
 Established Patient Office Visit  Subjective   Patient ID: Natasha Fitzgerald, female    DOB: [birth year]  Age: 58 y.o. MRN: [id number]  Chief Complaint  Patient presents with   Medical Management of Chronic Issues    HPI   Natasha Fitzgerald is seen for follow-up hypertension.  She has history of recurrent depression followed by psychiatry and was recently placed on Wellbutrin.  She feels like her blood pressures may have worsened after that.  She has had several readings 140-150 range systolic and 90s diastolic.  Last week had some low-grade headache which she thinks may be unrelated as well.  Currently on losartan  50 mg daily.  Tries to watch sodium intake.  No regular alcohol use.  No chest pains or peripheral edema.  Past Medical History:  Diagnosis Date   Anxiety    Chronic headaches 09/28/2009   DEPRESSION 09/28/2009   Fibromyalgia    History of kidney stones    HYPERTENSION 09/28/2009   IRREGULAR MENSES 09/28/2009   RUQ pain    Thyroid  disease    Past Surgical History:  Procedure Laterality Date   CESAREAN SECTION  1997   TONSILLECTOMY  1973    reports that she has never smoked. She has never used smokeless tobacco. She reports current alcohol use. She reports that she does not use drugs. family history includes Breast cancer in her maternal grandmother; Cancer in her paternal grandmother; Hashimoto's thyroiditis in her sister; Heart disease in her maternal grandfather and paternal grandfather; Hypertension in her mother. Allergies  Allergen Reactions   Doxycycline      headache, irritability, upset stomach   Penicillins     REACTION: hives    Review of Systems  Eyes:  Negative for blurred vision.  Respiratory:  Negative for shortness of breath.   Cardiovascular:  Negative for chest pain.  Neurological:  Positive for headaches. Negative for dizziness and weakness.      Objective:     BP (!) 142/90 (BP Location: Left Arm, Cuff Size: Normal)   Pulse 78   Temp 97.9  F (36.6 C) (Oral)   Wt 181 lb 11.2 oz (82.4 kg)   LMP 12/26/2012   SpO2 97%   BMI 32.19 kg/m  BP Readings from Last 3 Encounters:  10/01/23 (!) 142/90  09/25/23 135/78  03/05/23 130/80   Wt Readings from Last 3 Encounters:  10/01/23 181 lb 11.2 oz (82.4 kg)  03/05/23 179 lb 11.2 oz (81.5 kg)  02/12/23 177 lb (80.3 kg)      Physical Exam Vitals reviewed.  Constitutional:      General: She is not in acute distress.    Appearance: She is not ill-appearing.  Cardiovascular:     Rate and Rhythm: Normal rate and regular rhythm.  Pulmonary:     Effort: Pulmonary effort is normal.     Breath sounds: Normal breath sounds.  Musculoskeletal:     Right lower leg: No edema.     Left lower leg: No edema.  Neurological:     Mental Status: She is alert.      No results found for any visits on 10/01/23.  Last CBC Lab Results  Component Value Date   WBC 9.6 01/31/2023   HGB 15.4 (H) 01/31/2023   HCT 45.5 01/31/2023   MCV 86.2 01/31/2023   MCH 29.2 01/31/2023   RDW 13.2 01/31/2023   PLT 297 01/31/2023   Last metabolic panel Lab Results  Component Value Date   GLUCOSE 94 01/31/2023  NA 139 01/31/2023   K 4.0 01/31/2023   CL 103 01/31/2023   CO2 25 01/31/2023   BUN 11 01/31/2023   CREATININE 0.80 01/31/2023   GFRNONAA >60 01/31/2023   CALCIUM 10.0 01/31/2023   PROT 7.8 05/09/2021   ALBUMIN 4.9 05/09/2021   BILITOT 0.5 05/09/2021   ALKPHOS 85 05/09/2021   AST 15 05/09/2021   ALT 19 05/09/2021   ANIONGAP 11 01/31/2023   Last lipids Lab Results  Component Value Date   CHOL 144 02/15/2023   HDL 31.30 (L) 02/15/2023   LDLCALC 92 02/15/2023   LDLDIRECT 178.9 06/17/2012   TRIG 100.0 02/15/2023   CHOLHDL 5 02/15/2023     The 10-year ASCVD risk score (Arnett DK, et al., 2019) is: 5.1%    Assessment & Plan:   Hypertension.  Poorly controlled by several home readings and up slightly here today as well.  Possibly exacerbated by recent addition of  Wellbutrin.  -Recommend change losartan  to losartan /HCTZ 50/12.5 mg 1 daily.  Continue low-sodium diet.  Set up 1 month follow-up and plan to check basic metabolic panel then along with TSH to follow-up her hypothyroidism.  Wolm Scarlet, MD

## 2023-10-08 ENCOUNTER — Other Ambulatory Visit: Payer: Self-pay | Admitting: Family Medicine

## 2023-10-27 ENCOUNTER — Other Ambulatory Visit: Payer: Self-pay | Admitting: Family Medicine

## 2023-10-30 ENCOUNTER — Ambulatory Visit: Admitting: Family Medicine

## 2023-11-22 ENCOUNTER — Ambulatory Visit (INDEPENDENT_AMBULATORY_CARE_PROVIDER_SITE_OTHER)

## 2023-11-22 ENCOUNTER — Ambulatory Visit: Admitting: Family Medicine

## 2023-11-22 ENCOUNTER — Encounter: Payer: Self-pay | Admitting: Family Medicine

## 2023-11-22 VITALS — BP 136/74 | HR 96 | Temp 98.3°F | Wt 183.5 lb

## 2023-11-22 DIAGNOSIS — M79604 Pain in right leg: Secondary | ICD-10-CM | POA: Diagnosis not present

## 2023-11-22 DIAGNOSIS — M79644 Pain in right finger(s): Secondary | ICD-10-CM

## 2023-11-22 NOTE — Progress Notes (Unsigned)
 Established Patient Office Visit  Subjective   Patient ID: Natasha Fitzgerald, female    DOB: [birth year]  Age: 58 y.o. MRN: [id number]  Chief Complaint  Patient presents with   Finger Injury   Leg Pain    HPI  {History (Optional):23778} Natasha Fitzgerald is seen with couple of orthopedic concerns.  First is soreness involving right middle finger for about 2 to 3 weeks.  She is right-hand dominant.  Does not recall any injury.  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  Her pain and swelling of been confined mostly to the PIP joint.  No history of gout.  She has not noted any warmth or erythema.  Full range of motion of the finger.  Other concern is right anterior shin pain.  She states October 25 feels she was letting her dog out and dog suddenly turned and ran into her leg.  She had some immediate swelling and bruising.  Able to bear weight.  She describes what sounds like hematoma which is slowly going down but still has a bump on her leg.  Past Medical History:  Diagnosis Date   Anxiety    Chronic headaches 09/28/2009   DEPRESSION 09/28/2009   Fibromyalgia    History of kidney stones    HYPERTENSION 09/28/2009   IRREGULAR MENSES 09/28/2009   RUQ pain    Thyroid  disease    Past Surgical History:  Procedure Laterality Date   CESAREAN SECTION  1997   TONSILLECTOMY  1973    reports that she has never smoked. She has never used smokeless tobacco. She reports current alcohol use. She reports that she does not use drugs. family history includes Breast cancer in her maternal grandmother; Cancer in her paternal grandmother; Hashimoto's thyroiditis in her sister; Heart disease in her maternal grandfather and paternal grandfather; Hypertension in her mother. Allergies  Allergen Reactions   Doxycycline      headache, irritability, upset stomach   Penicillins     REACTION: hives    Review of Systems  Constitutional:  Negative for chills and fever.       Objective:     BP 136/74   Pulse 96   Temp 98.3 F (36.8 C) (Oral)   Wt 183 lb 8 oz (83.2 kg)   LMP 12/26/2012   SpO2 96%   BMI 32.51 kg/m  {Vitals History (Optional):23777}  Physical Exam Vitals reviewed.  Cardiovascular:     Rate and Rhythm: Normal rate and regular rhythm.  Musculoskeletal:     Comments: Right middle finger reveals some mild swelling at the PIP joint.  No warmth or erythema.  Minimally tender to palpation.  Full range of motion.  Right leg reveals small area of induration anterior tibia about quarter of the way down from the knee.  She has minimal tenderness to palpation.  She has some fading ecchymosis.  No visible break in the skin.  No knee effusion.  Neurological:     Mental Status: She is alert.      No results found for any visits on 11/22/23.  {Labs (Optional):23779}  The 10-year ASCVD risk score (Arnett DK, et al., 2019) is: 5.2%    Assessment & Plan:   Problem List Items Addressed This Visit   None Visit Diagnoses       Pain of finger of right hand    -  Primary   Relevant Orders   DG Finger Middle Right  Right leg pain       Relevant Orders   DG Tibia/Fibula Right     X-ray right middle finger reveals no acute bony abnormality.  Suspect small synovitis possibly from overuse.  She will try short-term use of anti-inflammatory  Right leg no acute bony abnormality.  Suspect recent soft tissue hematoma which appears to be resolving.  Reassurance given.  No follow-ups on file.    Wolm Scarlet, MD

## 2023-11-30 ENCOUNTER — Ambulatory Visit: Payer: Self-pay | Admitting: Family Medicine
